# Patient Record
Sex: Female | Born: 1960 | Race: White | Hispanic: No | Marital: Married | Smoking: Current every day smoker
Health system: Southern US, Community
[De-identification: ages and names within clinical notes are randomized; demographics above are authoritative.]

## PROBLEM LIST (undated history)

## (undated) ENCOUNTER — Emergency Department (HOSPITAL_COMMUNITY): Payer: Self-pay

## (undated) DIAGNOSIS — Z87442 Personal history of urinary calculi: Secondary | ICD-10-CM

## (undated) DIAGNOSIS — F419 Anxiety disorder, unspecified: Secondary | ICD-10-CM

## (undated) DIAGNOSIS — E559 Vitamin D deficiency, unspecified: Secondary | ICD-10-CM

## (undated) DIAGNOSIS — I10 Essential (primary) hypertension: Secondary | ICD-10-CM

## (undated) DIAGNOSIS — N200 Calculus of kidney: Secondary | ICD-10-CM

## (undated) DIAGNOSIS — E669 Obesity, unspecified: Secondary | ICD-10-CM

## (undated) DIAGNOSIS — F329 Major depressive disorder, single episode, unspecified: Secondary | ICD-10-CM

## (undated) DIAGNOSIS — S21009A Unspecified open wound of unspecified breast, initial encounter: Secondary | ICD-10-CM

## (undated) DIAGNOSIS — E213 Hyperparathyroidism, unspecified: Secondary | ICD-10-CM

## (undated) DIAGNOSIS — F32A Depression, unspecified: Secondary | ICD-10-CM

## (undated) DIAGNOSIS — K760 Fatty (change of) liver, not elsewhere classified: Secondary | ICD-10-CM

## (undated) DIAGNOSIS — E785 Hyperlipidemia, unspecified: Secondary | ICD-10-CM

## (undated) DIAGNOSIS — R7303 Prediabetes: Secondary | ICD-10-CM

## (undated) DIAGNOSIS — G4733 Obstructive sleep apnea (adult) (pediatric): Secondary | ICD-10-CM

## (undated) DIAGNOSIS — G4730 Sleep apnea, unspecified: Secondary | ICD-10-CM

## (undated) DIAGNOSIS — Z8719 Personal history of other diseases of the digestive system: Secondary | ICD-10-CM

## (undated) DIAGNOSIS — K219 Gastro-esophageal reflux disease without esophagitis: Secondary | ICD-10-CM

## (undated) HISTORY — DX: Vitamin D deficiency, unspecified: E55.9

## (undated) HISTORY — DX: Hyperparathyroidism, unspecified: E21.3

## (undated) HISTORY — DX: Essential (primary) hypertension: I10

## (undated) HISTORY — DX: Fatty (change of) liver, not elsewhere classified: K76.0

## (undated) HISTORY — PX: BACK SURGERY: SHX140

## (undated) HISTORY — DX: Sleep apnea, unspecified: G47.30

## (undated) HISTORY — PX: THYROID SURGERY: SHX805

## (undated) HISTORY — DX: Calculus of kidney: N20.0

## (undated) HISTORY — DX: Personal history of other diseases of the digestive system: Z87.19

## (undated) HISTORY — DX: Obstructive sleep apnea (adult) (pediatric): G47.33

## (undated) HISTORY — DX: Gastro-esophageal reflux disease without esophagitis: K21.9

## (undated) HISTORY — DX: Obesity, unspecified: E66.9

## (undated) HISTORY — PX: COLONOSCOPY: SHX174

## (undated) HISTORY — DX: Hyperlipidemia, unspecified: E78.5

## (undated) HISTORY — PX: PARTIAL HYSTERECTOMY: SHX80

---

## 1999-02-20 ENCOUNTER — Encounter: Payer: Self-pay | Admitting: Family Medicine

## 1999-02-20 ENCOUNTER — Ambulatory Visit (HOSPITAL_COMMUNITY): Admission: RE | Admit: 1999-02-20 | Discharge: 1999-02-20 | Payer: Self-pay | Admitting: Family Medicine

## 1999-12-30 ENCOUNTER — Encounter: Payer: Self-pay | Admitting: *Deleted

## 1999-12-30 ENCOUNTER — Encounter: Admission: RE | Admit: 1999-12-30 | Discharge: 1999-12-30 | Payer: Self-pay | Admitting: *Deleted

## 2000-02-24 ENCOUNTER — Encounter: Payer: Self-pay | Admitting: *Deleted

## 2000-02-24 ENCOUNTER — Encounter: Admission: RE | Admit: 2000-02-24 | Discharge: 2000-02-24 | Payer: Self-pay | Admitting: *Deleted

## 2000-03-06 ENCOUNTER — Observation Stay (HOSPITAL_COMMUNITY): Admission: RE | Admit: 2000-03-06 | Discharge: 2000-03-07 | Payer: Self-pay | Admitting: Urology

## 2000-03-06 ENCOUNTER — Encounter: Payer: Self-pay | Admitting: Emergency Medicine

## 2000-04-27 ENCOUNTER — Other Ambulatory Visit: Admission: RE | Admit: 2000-04-27 | Discharge: 2000-04-27 | Payer: Self-pay | Admitting: Gynecology

## 2000-12-17 ENCOUNTER — Encounter: Admission: RE | Admit: 2000-12-17 | Discharge: 2000-12-17 | Payer: Self-pay | Admitting: Obstetrics and Gynecology

## 2000-12-17 ENCOUNTER — Encounter: Payer: Self-pay | Admitting: Obstetrics and Gynecology

## 2001-01-06 ENCOUNTER — Other Ambulatory Visit: Admission: RE | Admit: 2001-01-06 | Discharge: 2001-01-06 | Payer: Self-pay | Admitting: *Deleted

## 2001-02-16 ENCOUNTER — Encounter: Payer: Self-pay | Admitting: Urology

## 2001-02-16 ENCOUNTER — Encounter: Admission: RE | Admit: 2001-02-16 | Discharge: 2001-02-16 | Payer: Self-pay | Admitting: Urology

## 2001-07-21 ENCOUNTER — Encounter: Payer: Self-pay | Admitting: Emergency Medicine

## 2001-07-21 ENCOUNTER — Encounter: Admission: RE | Admit: 2001-07-21 | Discharge: 2001-07-21 | Payer: Self-pay | Admitting: Emergency Medicine

## 2001-08-16 ENCOUNTER — Encounter: Admission: RE | Admit: 2001-08-16 | Discharge: 2001-08-16 | Payer: Self-pay | Admitting: Emergency Medicine

## 2001-08-16 ENCOUNTER — Encounter: Payer: Self-pay | Admitting: Emergency Medicine

## 2001-09-29 ENCOUNTER — Encounter: Payer: Self-pay | Admitting: Orthopaedic Surgery

## 2001-09-30 ENCOUNTER — Inpatient Hospital Stay (HOSPITAL_COMMUNITY): Admission: RE | Admit: 2001-09-30 | Discharge: 2001-10-01 | Payer: Self-pay | Admitting: Orthopaedic Surgery

## 2002-04-01 ENCOUNTER — Encounter: Payer: Self-pay | Admitting: Orthopaedic Surgery

## 2002-04-01 ENCOUNTER — Encounter: Admission: RE | Admit: 2002-04-01 | Discharge: 2002-04-01 | Payer: Self-pay | Admitting: Orthopaedic Surgery

## 2002-05-11 ENCOUNTER — Encounter: Payer: Self-pay | Admitting: Gynecology

## 2002-05-11 ENCOUNTER — Encounter: Admission: RE | Admit: 2002-05-11 | Discharge: 2002-05-11 | Payer: Self-pay | Admitting: Gynecology

## 2002-07-20 ENCOUNTER — Encounter: Admission: RE | Admit: 2002-07-20 | Discharge: 2002-07-20 | Payer: Self-pay | Admitting: Obstetrics and Gynecology

## 2002-07-20 ENCOUNTER — Other Ambulatory Visit: Admission: RE | Admit: 2002-07-20 | Discharge: 2002-07-20 | Payer: Self-pay | Admitting: Obstetrics and Gynecology

## 2002-07-20 ENCOUNTER — Encounter: Payer: Self-pay | Admitting: Obstetrics and Gynecology

## 2002-08-01 ENCOUNTER — Encounter: Admission: RE | Admit: 2002-08-01 | Discharge: 2002-08-01 | Payer: Self-pay | Admitting: Family Medicine

## 2002-08-01 ENCOUNTER — Encounter: Payer: Self-pay | Admitting: Family Medicine

## 2002-12-26 ENCOUNTER — Encounter: Admission: RE | Admit: 2002-12-26 | Discharge: 2002-12-26 | Payer: Self-pay | Admitting: Otolaryngology

## 2002-12-26 ENCOUNTER — Encounter: Payer: Self-pay | Admitting: Otolaryngology

## 2003-01-26 ENCOUNTER — Encounter: Payer: Self-pay | Admitting: Obstetrics and Gynecology

## 2003-01-26 ENCOUNTER — Ambulatory Visit (HOSPITAL_COMMUNITY): Admission: RE | Admit: 2003-01-26 | Discharge: 2003-01-26 | Payer: Self-pay | Admitting: Obstetrics and Gynecology

## 2003-05-29 ENCOUNTER — Encounter: Admission: RE | Admit: 2003-05-29 | Discharge: 2003-05-29 | Payer: Self-pay | Admitting: Internal Medicine

## 2003-09-26 ENCOUNTER — Emergency Department (HOSPITAL_COMMUNITY): Admission: EM | Admit: 2003-09-26 | Discharge: 2003-09-26 | Payer: Self-pay

## 2003-11-27 ENCOUNTER — Encounter: Admission: RE | Admit: 2003-11-27 | Discharge: 2003-11-27 | Payer: Self-pay | Admitting: *Deleted

## 2004-04-25 ENCOUNTER — Encounter: Admission: RE | Admit: 2004-04-25 | Discharge: 2004-04-25 | Payer: Self-pay | Admitting: Internal Medicine

## 2004-04-30 ENCOUNTER — Ambulatory Visit (HOSPITAL_COMMUNITY): Admission: RE | Admit: 2004-04-30 | Discharge: 2004-04-30 | Payer: Self-pay | Admitting: Urology

## 2004-07-11 ENCOUNTER — Encounter: Admission: RE | Admit: 2004-07-11 | Discharge: 2004-07-11 | Payer: Self-pay | Admitting: Urology

## 2004-11-22 ENCOUNTER — Emergency Department (HOSPITAL_COMMUNITY): Admission: EM | Admit: 2004-11-22 | Discharge: 2004-11-22 | Payer: Self-pay | Admitting: Family Medicine

## 2005-02-28 ENCOUNTER — Encounter: Admission: RE | Admit: 2005-02-28 | Discharge: 2005-02-28 | Payer: Self-pay | Admitting: Internal Medicine

## 2005-03-19 ENCOUNTER — Encounter: Admission: RE | Admit: 2005-03-19 | Discharge: 2005-03-19 | Payer: Self-pay | Admitting: Family Medicine

## 2005-08-26 ENCOUNTER — Encounter: Admission: RE | Admit: 2005-08-26 | Discharge: 2005-08-26 | Payer: Self-pay | Admitting: Internal Medicine

## 2006-01-23 ENCOUNTER — Encounter: Admission: RE | Admit: 2006-01-23 | Discharge: 2006-01-23 | Payer: Self-pay | Admitting: Specialist

## 2006-03-13 ENCOUNTER — Encounter: Admission: RE | Admit: 2006-03-13 | Discharge: 2006-03-13 | Payer: Self-pay | Admitting: Internal Medicine

## 2006-03-13 ENCOUNTER — Ambulatory Visit (HOSPITAL_COMMUNITY): Admission: AD | Admit: 2006-03-13 | Discharge: 2006-03-14 | Payer: Self-pay | Admitting: Urology

## 2006-03-23 ENCOUNTER — Ambulatory Visit (HOSPITAL_COMMUNITY): Admission: RE | Admit: 2006-03-23 | Discharge: 2006-03-23 | Payer: Self-pay

## 2006-04-29 ENCOUNTER — Encounter: Admission: RE | Admit: 2006-04-29 | Discharge: 2006-04-29 | Payer: Self-pay | Admitting: Urology

## 2006-05-26 ENCOUNTER — Encounter: Admission: RE | Admit: 2006-05-26 | Discharge: 2006-05-26 | Payer: Self-pay

## 2006-05-27 ENCOUNTER — Ambulatory Visit (HOSPITAL_BASED_OUTPATIENT_CLINIC_OR_DEPARTMENT_OTHER): Admission: RE | Admit: 2006-05-27 | Discharge: 2006-05-27 | Payer: Self-pay | Admitting: Urology

## 2006-05-29 ENCOUNTER — Encounter: Admission: RE | Admit: 2006-05-29 | Discharge: 2006-05-29 | Payer: Self-pay | Admitting: Internal Medicine

## 2006-06-04 ENCOUNTER — Ambulatory Visit (HOSPITAL_COMMUNITY): Admission: RE | Admit: 2006-06-04 | Discharge: 2006-06-04 | Payer: Self-pay | Admitting: Urology

## 2006-06-05 ENCOUNTER — Emergency Department (HOSPITAL_COMMUNITY): Admission: EM | Admit: 2006-06-05 | Discharge: 2006-06-06 | Payer: Self-pay | Admitting: Emergency Medicine

## 2006-07-03 ENCOUNTER — Ambulatory Visit (HOSPITAL_COMMUNITY): Admission: EM | Admit: 2006-07-03 | Discharge: 2006-07-04 | Payer: Self-pay | Admitting: Emergency Medicine

## 2006-07-13 ENCOUNTER — Ambulatory Visit (HOSPITAL_COMMUNITY): Admission: RE | Admit: 2006-07-13 | Discharge: 2006-07-13 | Payer: Self-pay | Admitting: Urology

## 2006-08-03 ENCOUNTER — Encounter: Admission: RE | Admit: 2006-08-03 | Discharge: 2006-08-03 | Payer: Self-pay | Admitting: Internal Medicine

## 2007-01-12 ENCOUNTER — Encounter: Admission: RE | Admit: 2007-01-12 | Discharge: 2007-01-12 | Payer: Self-pay | Admitting: Internal Medicine

## 2007-02-17 ENCOUNTER — Encounter: Admission: RE | Admit: 2007-02-17 | Discharge: 2007-02-17 | Payer: Self-pay | Admitting: Neurosurgery

## 2007-07-06 ENCOUNTER — Encounter: Admission: RE | Admit: 2007-07-06 | Discharge: 2007-07-06 | Payer: Self-pay | Admitting: Internal Medicine

## 2007-09-30 ENCOUNTER — Encounter: Admission: RE | Admit: 2007-09-30 | Discharge: 2007-09-30 | Payer: Self-pay | Admitting: Internal Medicine

## 2007-10-27 ENCOUNTER — Encounter: Admission: RE | Admit: 2007-10-27 | Discharge: 2007-10-27 | Payer: Self-pay | Admitting: Surgery

## 2007-10-28 ENCOUNTER — Encounter: Admission: RE | Admit: 2007-10-28 | Discharge: 2007-10-28 | Payer: Self-pay | Admitting: Surgery

## 2008-01-06 ENCOUNTER — Ambulatory Visit (HOSPITAL_COMMUNITY): Admission: RE | Admit: 2008-01-06 | Discharge: 2008-01-07 | Payer: Self-pay | Admitting: Surgery

## 2008-01-06 ENCOUNTER — Encounter (INDEPENDENT_AMBULATORY_CARE_PROVIDER_SITE_OTHER): Payer: Self-pay | Admitting: Surgery

## 2008-01-25 ENCOUNTER — Observation Stay (HOSPITAL_COMMUNITY): Admission: EM | Admit: 2008-01-25 | Discharge: 2008-01-26 | Payer: Self-pay | Admitting: Emergency Medicine

## 2008-02-22 ENCOUNTER — Ambulatory Visit (HOSPITAL_COMMUNITY): Admission: RE | Admit: 2008-02-22 | Discharge: 2008-02-23 | Payer: Self-pay | Admitting: Urology

## 2008-03-21 ENCOUNTER — Encounter: Admission: RE | Admit: 2008-03-21 | Discharge: 2008-03-21 | Payer: Self-pay

## 2008-07-25 ENCOUNTER — Encounter: Admission: RE | Admit: 2008-07-25 | Discharge: 2008-07-25 | Payer: Self-pay | Admitting: Otolaryngology

## 2009-01-31 ENCOUNTER — Encounter: Admission: RE | Admit: 2009-01-31 | Discharge: 2009-01-31 | Payer: Self-pay | Admitting: Internal Medicine

## 2009-09-03 ENCOUNTER — Encounter: Admission: RE | Admit: 2009-09-03 | Discharge: 2009-09-03 | Payer: Self-pay | Admitting: Neurosurgery

## 2009-09-25 ENCOUNTER — Inpatient Hospital Stay (HOSPITAL_COMMUNITY): Admission: RE | Admit: 2009-09-25 | Discharge: 2009-09-28 | Payer: Self-pay | Admitting: Neurosurgery

## 2010-03-06 ENCOUNTER — Encounter: Admission: RE | Admit: 2010-03-06 | Discharge: 2010-03-06 | Payer: Self-pay | Admitting: Obstetrics and Gynecology

## 2010-05-21 ENCOUNTER — Encounter
Admission: RE | Admit: 2010-05-21 | Discharge: 2010-05-21 | Payer: Self-pay | Source: Home / Self Care | Attending: Otolaryngology | Admitting: Otolaryngology

## 2010-06-09 ENCOUNTER — Encounter: Payer: Self-pay | Admitting: Internal Medicine

## 2010-06-09 ENCOUNTER — Encounter: Payer: Self-pay | Admitting: Obstetrics and Gynecology

## 2010-06-09 ENCOUNTER — Encounter: Payer: Self-pay | Admitting: Gynecology

## 2010-06-09 ENCOUNTER — Encounter: Payer: Self-pay | Admitting: Diagnostic Radiology

## 2010-08-06 LAB — URINALYSIS, ROUTINE W REFLEX MICROSCOPIC
Glucose, UA: NEGATIVE mg/dL
Ketones, ur: NEGATIVE mg/dL
pH: 8.5 — ABNORMAL HIGH (ref 5.0–8.0)

## 2010-08-06 LAB — COMPREHENSIVE METABOLIC PANEL
ALT: 17 U/L (ref 0–35)
AST: 21 U/L (ref 0–37)
Albumin: 4.2 g/dL (ref 3.5–5.2)
Alkaline Phosphatase: 80 U/L (ref 39–117)
Alkaline Phosphatase: 83 U/L (ref 39–117)
BUN: 13 mg/dL (ref 6–23)
Calcium: 9.5 mg/dL (ref 8.4–10.5)
Chloride: 106 mEq/L (ref 96–112)
Creatinine, Ser: 0.77 mg/dL (ref 0.4–1.2)
GFR calc Af Amer: >60 mL/min (ref >60)
Glucose, Bld: 97 mg/dL (ref 70–99)
Potassium: 3.8 mEq/L (ref 3.5–5.1)
Potassium: 4.7 mEq/L (ref 3.5–5.1)
Total Bilirubin: 2.3 mg/dL — ABNORMAL HIGH (ref 0.3–1.2)
Total Protein: 7 g/dL (ref 6.0–8.3)

## 2010-08-06 LAB — CBC
HCT: 47.1 % — ABNORMAL HIGH (ref 36.0–46.0)
Hemoglobin: 16.3 g/dL — ABNORMAL HIGH (ref 12.0–15.0)
MCHC: 34.6 g/dL (ref 30.0–36.0)
MCV: 92.3 fL (ref 78.0–100.0)
Platelets: 223 10*3/uL (ref 150–400)
RDW: 14.1 % (ref 11.5–15.5)

## 2010-08-06 LAB — DIFFERENTIAL
Basophils Relative: 1 % (ref 0–1)
Lymphocytes Relative: 21 % (ref 12–46)
Monocytes Relative: 5 % (ref 3–12)
Neutro Abs: 5.5 10*3/uL (ref 1.7–7.7)
Neutrophils Relative %: 72 % (ref 43–77)

## 2010-08-06 LAB — APTT: aPTT: 33 seconds (ref 24–37)

## 2010-08-06 LAB — SURGICAL PCR SCREEN: Staphylococcus aureus: POSITIVE — AB

## 2010-08-06 LAB — PROTIME-INR: INR: 0.95 (ref 0.00–1.49)

## 2010-08-06 LAB — ABO/RH: ABO/RH(D): O POS

## 2010-10-01 NOTE — Op Note (Signed)
[name], [name] NO.:  [id number]   MEDICAL RECORD NO.:  [id number]          PATIENT TYPE:  AMB   LOCATION:  DAY                          FACILITY:  [facility]   PHYSICIAN:  Velora Heckler, MD      DATE OF BIRTH:  [birth year]   DATE OF PROCEDURE:  01/06/2008  DATE OF DISCHARGE:                               OPERATIVE REPORT   PREOPERATIVE DIAGNOSIS:  Primary hyperparathyroidism.   POSTOPERATIVE DIAGNOSIS:  Primary hyperparathyroidism.   PROCEDURE:  Right inferior parathyroidectomy.   SURGEON:  Velora Heckler, M.D., FACS   ASSISTANT:  Amber L. Freida Busman, M.D.   ANESTHESIA:  General per Dr. Brayton Caves.   ESTIMATED BLOOD LOSS:  Minimal.   PREPARATION:  Betadine.   COMPLICATIONS:  None.   INDICATIONS:  The patient is a 50 year old white female from [place],  [place].  She has a history of hypercalcemia with elevated intact  PTH levels.  She has had multiple kidney stones.  Workup included  laboratory studies showing an elevated calcium of 11.4 and an intact PTH  level of 88.4, ionized calcium was elevated at 6.0.  Sestamibi scan was  unrevealing.  MRI scan showed a 10-mm mass in the right inferior neck  consistent with parathyroid adenoma.  The patient now comes to surgery  for excision.   BODY OF REPORT:  The procedure was done in OR #11 at the [facility].  The patient was brought to the operating room and  placed in a supine position on the operating room table.  Following  administration of general anesthesia, the patient is positioned and then  prepped and draped in the usual strict aseptic fashion.  After  ascertaining that an adequate level of anesthesia had been achieved, a  lower right neck incision was made with a #15 blade.  Dissection was  carried through subcutaneous tissues and platysma.  Hemostasis was  obtained with the electrocautery.  Skin flaps were elevated  circumferentially.  A Weitlaner retractor was placed  for exposure.  Strap muscles were incised in the midline and reflected to the right.  Inferior pole of the thyroid is identified.  Dissection in the  tracheoesophageal groove reveals a small rounded mass consistent with  the image on MRI scan.  This was gently dissected out.  The recurrent  laryngeal nerve was draped across the mass and is gently mobilized away  from it, taking care to prevent injury.  Vascular tributaries were  divided between small Ligaclips.  Mass was completely mobilized and  excised.  It is submitted to pathology where frozen section by Dr. Berneta Levins confirms parathyroid tissue consistent with parathyroid  adenoma.  Good hemostasis was noted in the neck.  Surgicel was placed in  the operative field.  Strap muscles were reapproximated in the midline  with interrupted 3-0 Vicryl sutures.  Platysma was closed with  interrupted 3-0 Vicryl sutures.  Skin was anesthetized with local  anesthetic.  Skin  edges were reapproximated with a running 4-0 Monocryl subcuticular  suture.  Wound is washed and dried and  Benzoin and Steri-Strips are  applied.  Sterile dressings are applied.  The patient is awakened from  anesthesia and brought to the recovery room in stable condition.  The  patient tolerated the procedure well.      Velora Heckler, MD  Electronically Signed     TMG/MEDQ  D:  01/06/2008  T:  01/06/2008  Job:  [id number]   cc:   Kari Baars, M.D.  Fax: [phone]   Jamison Neighbor, M.D.  Fax: [phone]

## 2010-10-01 NOTE — Op Note (Signed)
NAMEKERRILYNN, [name] NO.:  [id number]   MEDICAL RECORD NO.:  [id number]          PATIENT TYPE:  OIB   LOCATION:  1419                         FACILITY:  [facility]   PHYSICIAN:  Jamison Neighbor, M.D.  DATE OF BIRTH:  1961-02-13   DATE OF PROCEDURE:  02/22/2008  DATE OF DISCHARGE:                               OPERATIVE REPORT   PREOPERATIVE DIAGNOSIS:  Left upper pole staghorn nephrolithiasis.   POSTOPERATIVE DIAGNOSIS:  Left upper pole staghorn nephrolithiasis.   PROCEDURES:  1. Left percutaneous nephrostolithotomy.  2. Intraoperative fluoroscopy with interpretation.   SURGEON:  Dr. Marcelyn Bruins   ASSISTANT:  Dr. Delman Kitten   ANESTHESIA:  General.   INDICATIONS FOR PROCEDURE:  [name] is a 50 year old white female  with past medical history positive for hyperparathyroidism.  Her  endocrine condition has been surgically corrected and now she presents  for definitive management of her stone burden.  She has had a history of  nephrolithiasis, including recent obstructive left distal ureteral stone  that was managed few weeks ago.  She has had an indwelling stent.  She  had primary access established in her lower pole in the interventional  radiology suite today.  Preoperatively all risks, benefits,  consequences, and concerns were discussed and informed consent was  obtained.   PROCEDURE IN DETAIL:  The patient was brought to the operating room and  placed in the supine position.  She was correctly identified by  wristband and appropriate time-out was taken.  IV antibiotics were  administered.  General anesthesia was delivered.  Once general  anesthesia was established, a 16-French Foley catheter was placed under  sterile conditions into her bladder.  This was placed to straight drain.  She was then rolled into a prone position and great care was taken to  pad all appropriate pressure points to reduce the risk of peripheral  neuropathy or tissue  compression.  Her left flank revealed the  indwelling angiographic catheter that was sutured to the skin.  This  suture was cut.  Her left flank was then prepped and draped in normal  sterile fashion.  Interventional radiology assisted in the next aspect  of the procedure.   We advanced a flexible tip guidewire through the angiographic catheter  using fluoroscopic guidance and guided the guidewire into the level of  bladder.  The indwelling ureteral stent was noted to have been pushed  down where the proximal curl was straightened and was at the level of  the UPJ.  Once the guidewire was safely in the bladder, the angiographic  catheter was removed.  We then used a dilating sheath over this  guidewire to dilate the tract appropriately.  The inner sheath was  removed and the outer sheath was left intact.  Through this we passed a  stiff guidewire also to the level of the bladder neck.  With both  guidewires safely traversing the left-sided urinary tract, we removed  the dilating sheath.   We then advanced an X-Force 15-cm nephrostomy balloon dilating catheter  over the stiff guidewire, again using fluoroscopic guidance.  Once  appropriately positioned, we infiltrated the balloon and dilated the  tract to approximately 16 atmospheres, sufficient enough to dilate the  waist out of the balloon.  Once appropriate dilation was achieved, we  then advanced the rigid access sheath over the balloon dilator, again  using fluoroscopic guidance.  Once appropriately positioned, the balloon  was deflated and removed, leaving both guidewires as well as the access  sheath behind.  We then advanced our nephroscope into the left renal  pelvis.  Normal saline was used as our irrigant.  We soon identified our  large upper pole staghorn stone.  We then used the lithoclast device to  fragment our stone completely.  Once sufficient fragmentation had been  achieved, we removed the lithoclast and used our Tricep  graspers to  remove the remaining stone burden.   We then performed flexible pyelography with a flexible cystoscope.  The  upper pole was now clean.  There was no evidence of perforation.  All of  the remaining stone fragments were smaller than 2 mm.  The rest of the  left renal collecting system was also stone free.  We then angled our  access sheath down to the UPJ and through our nephroscope, we were able  to grasp the proximal end of the double-J ureteral stent and pulled it  back into the appropriate position, as verified by fluoroscopy.  We  removed our first guidewire without any movement of our ureteral stent.  We then advanced an 18-French Councill tip catheter through the access  sheath into the right renal collecting system and inflated the balloon  with half-strength contrast.  Fluoroscopy verified the appropriate  position of this.  We then removed our sheath.  There was no significant  bleeding associated with this.  We then removed our remaining stiff  guidewire and fluoroscopy verified continued good position of our  nephrostomy tube as well as our ureteral stent.  We then sutured our  nephrostomy tube/Councill tip catheter to the skin using silk suture and  this marked the end of our procedure.  She awakened from anesthesia and  was taken to the recovery room in stable condition.  There were no  complications.  Dr. Logan Bores was present and participated in all aspects of  the case.     ______________________________  Villa Herb, M.D.  Electronically Signed    DW/MEDQ  D:  02/22/2008  T:  02/23/2008  Job:  [id number]

## 2010-10-01 NOTE — Op Note (Signed)
NAMEZOI, DEVINE NO.:  [id number]   MEDICAL RECORD NO.:  [id number]          PATIENT TYPE:  OBV   LOCATION:  1614                         FACILITY:  [facility]   PHYSICIAN:  Valetta Fuller, M.D.  DATE OF BIRTH:  August 07, 1960   DATE OF PROCEDURE:  01/26/2008  DATE OF DISCHARGE:  01/26/2008                               OPERATIVE REPORT   PREOPERATIVE DIAGNOSIS:  Left ureteral stone.   POSTOPERATIVE DIAGNOSIS:  Left ureteral stone.   PROCEDURE.:  1. Cystourethroscopy.  2. Left retrograde pyelogram.  3. Intraoperative fluoroscopy with interpretation.  4. Left ureteroscopic stone manipulation with laser lithotripsy.  5. Placement of left 6 x 24 double-J ureteral stent.   ATTENDING PHYSICIAN:  Valetta Fuller, M.D.   RESIDENT PHYSICIAN:  Maudie Flakes, MD.   ANESTHESIA:  General.   INDICATIONS FOR PROCEDURE:  Ms. Patty is a 50 year old white female  with a past history of nephrolithiasis.  She was admitted with  intractable pain and found to have a left distal ureteral stone, along  with a left lower pole renal stone.  She was brought to the hospital for  pain control, was given IV antibiotics and was scheduled to go to the  operating room this morning for the above-stated procedure.  Preoperatively all risks, benefits, consequences and concerns were  discussed.  Informed consent was obtained.   PROCEDURE IN DETAIL:  The patient was brought to the operating room and  placed in the supine position.  She was correctly identified by  wristband and an appropriate time-out was taken.  General anesthesia was  delivered.  Once adequately anesthetized, she was placed in the dorsal  lithotomy position -- with great care taken to minimize the risk of  peripheral neuropathy or compartment syndrome.  Her perineum was prepped  and draped sterilely.   We began our procedure by performing rigid cystourethroscopy with a 22-  French rigid cystoscopic sheath, 12-degree lens,  and normal saline as  our irrigant.  Her urethra was normal in course and caliber.  Upon  entering the bladder clear urine was identified.  Both ureteral orifices  were noted be in their normal anatomic position and effluxing clear  urine.  No urothelial abnormalities or stones were seen.  Her left  ureteral orifice was cannulated with an acorn tipped catheter.  Gentle  left retrograde pyelogram demonstrated the distal left ureteral stone as  a filling defect.  The left renal pelvis filled out appropriately;  and  no ureteral strictures were noticed and no other stones were seen in the  ureter as well.  We then gently cannulated the left ureteral orifice  with a sensor tipped guidewire, and advanced this up to the level of  left renal pelvis using fluoroscopic guidance.   We then removed the cystoscope and then advanced the semi-rigid  ureteroscope into the bladder.  Her distal left ureter and ureteral  orifice were both widely patent.  We were able to easily advance the  ureteroscope into the ureter and soon encountered the stone.  This stone  appeared soft.  We used a  360 nanometer set at 5 volt setttings at 0.5  joules of 5 Hz.  We then systematically dusted the stone to  submillimeter fragments.  We removed the laser fiber and used a nitinol  basket to grab the smaller fragments, and placed them into the bladder.   Final inspection demonstrated no large stone fragments and no ureteral  injury.  We then advanced a 6 x 24 contoured double-J stent over the  guidewire, and successfully placed the proximal curl in the left renal  collecting system.  The distal curl was noted to be in the bladder.  The  stent was seen to be venting urine, indicating appropriate function.  We  then removed the ureteroscope, replaced the cystoscope into bladder, and  at the same time irrigated her bladder free from all stone fragments.  These stone fragments will be sent for stone analysis.   She awoke  from anesthesia and was taken to the recovery room in stable  condition.  There were no complications.  She tolerated procedure well.   Dr. Isabel Caprice was present and participated in all aspects of the case.     ______________________________  Maudie Flakes, M.D.      Valetta Fuller, M.D.  Electronically Signed    WD/MEDQ  D:  01/26/2008  T:  01/26/2008  Job:  [id number]

## 2010-10-04 NOTE — Op Note (Signed)
[facility]  Patient:    [name], [name] Visit Number: [id number] MRN: [id number]          Service Type: SUR Location: 4W [id number] [id number] Attending Physician:  Patricia Nettle Dictated by:   Patricia Nettle, M.D. Proc. Date: 09/28/01 Admit Date:  09/29/2001                             Operative Report  DATE OF BIRTH:  June 20, 1960  PREOPERATIVE DIAGNOSES: 1. 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 2. Degenerative disk disease and lumbago.  POSTOPERATIVE DIAGNOSES: 1. 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 2. Degenerative disk disease and lumbago.  OPERATION: Left L4-L5 lateral recess decompression and diskectomy.  SURGEON:  Patricia Nettle, M.D.  ASSISTANT:  _____ Violet Baldy.  ANESTHESIA:  General.  COMPLICATIONS:  None.  INDICATIONS AND FINDINGS:  The patient is a 50 year old female who I have been treating for back and left lower extremity pain.  Her exam is consistent with a left L5 radiculopathy.  MRI scan showed multilevel degenerative changes with a bulging disk at L4-L5, facet hypertrophy, and thickening of the ligament of flavum creating severe left L4-L5 lateral recess stenosis.  She has had a discogram which showed a concurrent pain response at L3-4 and L5-S1.  She has failed various pain medications, anti-inflammatories, physical therapy, injections, and after weighing the risks and benefits of microdiskectomy, left L4-L5 she has decided to proceed with surgery in hopes of improving her symptoms.  She understands that she has significant degenerative changes and will likely have some residual back pain after the procedure.  At surgery, the L5 nerve root was found to be compressed within the lateral recess.  There was a bulging disk in the posterior lateral position and this was removed.  The disk material was found to be degenerative.  The L5 nerve  root was decompressed from its takeoff of the common dural sac all the way out the L5-S1 foramen.  DESCRIPTION OF PROCEDURE:  The patient was identified in the holding area as [name] and taken to the operating room.  She underwent general endotracheal anesthesia without difficulty.  She was turned prone to the adequate posterior four positioning frame.  She was given prophylactic IV antibiotics.  All bony prominences were padded.  The face was protected at all times.  A 4 cm incision was made over L3, L4 and L5.  This was carried down to the deep fascia.  The deep fascia was incised, and the paraspinal muscles were stripped out subperiosteally to the L3 and L4-L5 facet joints.  Care was taken to protect the facet capsules.  We took an intraoperative x-ray and our markers were on the L3 spinous process and we worked down from here.  We exposed the L4 and L5 lamina.  The retractor was placed.  Using a bur, the inferior one-third of L4 was removed.  The superior one-third of L5 was removed as well as the medial one-third of the facet joint.  We then used Kerrison rongeurs and angled curets to remove the remaining lamina exposing the epidural space.  Bleeding was controlled with bipolar electrocautery and Gelfoam.  We identified the L5 nerve root.  This was being compressed within the lateral recess and the overhanging parafacet was removed.  We then identified the disk which  was bulging and soft.  We easily entered the disk space with a Cytogeneticist.  Disk material immediately began to extrude out, and this was removed with a pituitary rongeur.  All loose fragments were removed.  The disk space was copiously irrigated and we floated out two small fragments and these were then removed.  The L5 nerve root was then decompressed until its entry point into the L5-S1 foramen.  We confirmed patency of the L5 nerve root out the foramen with a blunt probe. Decompression was carried  lateral flush with the pedicle.  The wound was copiously irrigated.  Then 15 mcg of fentanyl was left in the epidural space. Gelfoam was placed over the nerve root.  The deep layer was closed with #1 running Vicryl suture.  Subcutaneous layer was closed with 2-0 interrupted Vicryl followed by a running 4-0 subcuticular Vicryl and then Benzoin and Steri-Strips.  Sterile dressing was applied.  The patient was extubated without difficulty and was able to move her upper and lower extremities when she was transferred to the recovery room in stable condition. Dictated by:   Patricia Nettle, M.D. Attending Physician:  Patricia Nettle. DD:  09/29/01 TD:  09/30/01 Job: [id number]

## 2010-10-04 NOTE — Op Note (Signed)
NAME:  [name], [name] NO.:  [id number]   MEDICAL RECORD NO.:  [id number]          PATIENT TYPE:  AMB   LOCATION:  NESC                         FACILITY:  [facility]   PHYSICIAN:  Jamison Neighbor, M.D.  DATE OF BIRTH:  Oct 05, 1960   DATE OF PROCEDURE:  05/27/2006  DATE OF DISCHARGE:                               OPERATIVE REPORT   PREOPERATIVE DIAGNOSIS:  Left ureteropelvic junction stone.   POSTOPERATIVE DIAGNOSIS:  Left ureteropelvic junction stone.   PROCEDURE:  Cystoscopy, left retrograde,left ureteroscopy, left double-J  catheter insertion.   SURGEON:  Jamison Neighbor, M.D.   ANESTHESIA:  General.   COMPLICATIONS:  None.   DRAINS:  6-French x 26 cm double-J catheter.   BRIEF HISTORY:  This 50 year old female has acute left-sided flank pain.  CT scan demonstrated stone measuring 9 x 11 in the proximal ureter.  The  patient had marked hydronephrosis.  The patient now to undergo  cystoscopy and stent placement along with possible ureteroscopy with in  situ laser lithotripsy if feasible.  The patient understands the risks  and benefits of the procedure and gave full informed consent.   PROCEDURE:  After successful induction of general anesthesia, the  patient was placed in the dorsal position, prepped with Betadine and  draped in the usual sterile fashion.  Careful bimanual examination  reveals a grade 2 cystocele.  She does not have significant vault  prolapse and only a very modest rectocele.  The urethra was palpably  normal.  The cystoscope was inserted, bladder was carefully inspected.  No tumors or stones could be seen.  The ureteral orifice was  unremarkable in its appearance.  The patient underwent a retrograde  study.  She had it done through a 6-French ureteral catheter.   Retrograde ureteral pyelography performed through a 6-French ureteral  catheter showed a normal ureter with a filling defect in the proximal  ureter consistent with a large  stone.  The stone actually appeared to go  back up into the kidney and flipped back into the pelvis.  Collecting  system itself was markedly dilated due to the stone but no other filling  defects could be seen and the remainder of the collecting system  appeared to be normal.   After completion of the retrograde study, a guidewire was passed up to  the kidney.  Ureteroscopy was then performed.  Ureter was visualized in  its entirety and found to be very large in size and should certainly be  able to pass a significant sized stone.  The stone was seen to have gone  back up into the kidney and that position was really not amendable to in  situ laser lithotripsy.  It was felt to be better to simply place a  stent to allow for stone passage and then to proceed with ESWL.   The guidewire was left in place.  The ureteroscope was withdrawn.  Ureter was once again visualized and felt to be acceptable.  The  cystoscope was back loaded over the wire.  A 6-French x 26 cm double-J  catheter was passed  up to the kidney where it coiled normally in the  collecting system.  The bladder was drained.  The patient tolerated  procedure well, was taken to the recovery room good condition.  She will  be sent home on Percocet to take as needed for pain, Pyridium Plus to  take as needed for burning or spasm associated with the stent and will  begin placed on Macrodantin in case there is any bacteria within the  stone.  She will be maintained on antibiotics until we get stent out.  I  am going to plan for ESWL ASAP.           ______________________________  Jamison Neighbor, M.D.  Electronically Signed     RJE/MEDQ  D:  05/27/2006  T:  05/27/2006  Job:  [id number]

## 2010-10-04 NOTE — Op Note (Signed)
[name], SMETHURST NO.:  [id number]   MEDICAL RECORD NO.:  [id number]          PATIENT TYPE:  AMB   LOCATION:  DAY                          FACILITY:  [facility]   PHYSICIAN:  Jamison Neighbor, M.D.  DATE OF BIRTH:  1960/11/10   DATE OF PROCEDURE:  03/13/2006  DATE OF DISCHARGE:                                 OPERATIVE REPORT   PREOPERATIVE DIAGNOSIS:  Left ureteral and renal calculi.   POSTOPERATIVE DIAGNOSIS:  Left ureteral and renal calculi.   PROCEDURE:  Cystoscopy, left retrograde, left ureteroscopy, basket  extraction of ureteral calculus, and placement of left double-J catheter.   SURGEON:  Jamison Neighbor, M.D.   ANESTHESIA:  General.   COMPLICATIONS:  None.   DRAIN:  A 6-French x 26 cm double-J catheter.   BRIEF HISTORY:  This 50 year old female presented to the office in acute  pain.  The patient had a CT scan which showed a 19 mm stone in the left  kidney, but that was nonobstructing.  There was an obstructing calculus in  the midureter, approximately 9 mm in size.  The patient is now to undergo  cystoscopy and basket extraction and/or laser lithotripsy to the distal  ureteral calculus and placement of a double-J catheter so that she can  eventually undergo ESWL.  She understands the risks and benefits of the  procedure and gave full informed consent.  The patient was told  preoperatively that because she had a high white count she would be kept  overnight for observation and antibiotic therapy, even though she really has  not been febrile.  She understands the risks and benefits of the procedure,  including the need for additional treatment on the left kidney stone which  will be either ESWL or percutaneous nephrolithotripsy.  Full informed  consent was obtained.   PROCEDURE:  After successful induction of general anesthesia, the patient  was placed in the dorsal lithotomy position, prepped with Betadine, and  draped in the usual sterile  fashion.  Cystoscopy was performed.  The bladder  was carefully inspected.  No tumor or stones could be seen.  The left  ureteral orifice was identified.  A 6-French ureteral catheter was inserted  into the left ureter.   Left retrograde pyelogram was performed by injecting contrast under direct  vision through the opening in the catheter.  The patient was found to have a  filling defect in the midportion of the ureter that actually seemed to be  higher than the S1 level that had been described on the CT scan.  Above that  area, there was definite hydronephrosis.  A calculus was clearly defined  within the kidney itself.   A guide wire was then passed up to the kidney, and the ureteral catheter was  removed.  The ureteroscope was then inserted and advanced until the stone  could be identified.  The stone was grasped with a nitinol and was then  withdrawn under direct vision.  The double-J catheter was then passed over  the guide wire and allowed to coil normally in the renal pelvis  as well as  within the bladder.  The bladder was drained.  The patient tolerated the  procedure well and was taken to the recovery room in good condition.  The  patient will be kept for 23-hour observation and antibiotic coverage due to  high white count of over 20,000.  If the patient is afebrile tomorrow, she  will be sent home and will eventually have ESWL.  She will be maintained on  the antibiotic therapy until that time.           ______________________________  Jamison Neighbor, M.D.  Electronically Signed     RJE/MEDQ  D:  03/13/2006  T:  03/15/2006  Job:  [id number]

## 2010-10-04 NOTE — Op Note (Signed)
[facility]  Patient:    [name], [name]                       MRN: [id number] Proc. Date: 03/07/00 Adm. Date:  [id number] Disc. Date: [id number] Attending:  Cathren Laine                           Operative Report  SERVICE:  Urology.  PREOPERATIVE DIAGNOSES:  Right ureteral calculus.  POSTOPERATIVE DIAGNOSES:  Right ureteral calculus.  PROCEDURE:  Cystoscopy, right retrograde, right ureteroscopy, right basket extraction, and right double J catheter insertion.  SURGEON:  Dr. Logan Bores.  ANESTHESIA:  General.  COMPLICATIONS:  None.  DRAINS:  6 French right 26 cm double J catheter.  BRIEF HISTORY:  This 50 year old female presented to the emergency room at [facility] on March 06, 2000 with pain that had started at approximately 8:30 in the morning. A CT scan showed that she had a stone in the proximal ureter with hydronephrosis. The stone was in a position where it was not accessible to ESWL and because of its size, it was felt that holmium laser lithotripsy might be required. The patient was transferred from [facility] to [facility] in order to make the holmium laser available for the procedure. She was started on morphine and Toradol. She is now to undergo ureteroscopy with basket extraction and/or laser lithotripsy as indicated. The patient understands the risks and benefits of the procedure and gave full and informed consent.  DESCRIPTION OF PROCEDURE:  After successful induction of general anesthesia, the patient was placed in a dorsal lithotomy position and prepped with Betadine and draped in the usual sterile fashion. Cystoscopy was performed. The bladder was carefully inspected and was free of any tumor or stones. Both ureteral orifices were normal in configuration and location. Retrograde study performed on the right hand side showed a stone right overlying the sacrum where it would not be accessible to ESWL. A guidewire was passed  up to the kidney and the distal ureter was then dilated with a balloon dilator. The ureteroscope was inserted, the stone was visualized. In spite of its large size, it was successfully extracted using a basket. The ureteroscope was then advanced all the way up to the kidney. There was a significant kink in the ureter secondary to the obstruction but the guidewire was negotiated beyond that point. No other stones or areas of obstruction were noted. A double J catheter was passed up to the kidney, allowed to coil normally within the pelvis. It also coiled normally in the bladder. The string was left intact so the patient could pull this out in 48-72 hours. The patient tolerated the procedure well and was taken to the recovery room in good condition. She will be given a prescription for Lorcet plus to take as needed for pain, Pyridium plus for any burning or spasm and Septra DS 1 daily. DD:  03/07/00 TD:  03/08/00 Job: [id number]

## 2010-10-04 NOTE — Op Note (Signed)
[name], BIONDO NO.:  [id number]   MEDICAL RECORD NO.:  [id number]          PATIENT TYPE:  EMS   LOCATION:  ED                           FACILITY:  [facility]   PHYSICIAN:  Mark C. Vernie Ammons, M.D.  DATE OF BIRTH:  1960-11-11   DATE OF PROCEDURE:  07/04/2006  DATE OF DISCHARGE:                               OPERATIVE REPORT   PREOPERATIVE DIAGNOSIS:  Bilateral ureteral calculi with obstruction.   POSTOPERATIVE DIAGNOSIS:  Bilateral ureteral calculi with obstruction.   PROCEDURE:  Cystoscopy, bilateral retrograde pyelograms with  interpretation, bilateral ureteroscopy with bilateral stone basket  extraction and bilateral stent placement.   SURGEON:  Mark C. Vernie Ammons, M.D.   ANESTHESIA:  General.   SPECIMENS:  Stone to patient.   BLOOD LOSS:  Minimal.   DRAINS:  4.8 French 24 cm double-J stent in the right ureter (with  string), 6 French 26 cm double-J stent in the left ureter (no string).   COMPLICATIONS:  None.   INDICATIONS:  The patient is a 50 year old white female who has seen Dr.  Logan Bores in the past for renal and ureteral calculi.  She reports having  had three or four ureteroscopies and basket extractions in the past and  recently in January she had a stent placed and then lithotripsy of the  left renal calculus.  She reported she did good fragmentation of the  stone and now presented to the emergency room with severe right flank  pain.  A CT scan revealed in addition to a 12-mm stone in her left renal  pelvis, she had one 5 mm stone in the distal right ureter and one 5 mm  stone in the distal left ureter with moderate hydronephrosis on the  right.  We discussed the bilateral nature of this and the need for  ureteroscopic extraction of the stone with stent placement and possible  consideration of lithotripsy of a left renal calculus.  The risks,  complications and alternatives were discussed.  The patient understands  and elected to proceed.   DESCRIPTION OF OPERATION:  After informed consent, the patient brought  to the major OR, placed on table and administered general anesthesia.  She was then moved to the dorsal lithotomy position.  Genitalia was  sterilely prepped and draped.  Initially a 21-French cystoscope with 12  degrees lens was inserted in the bladder.  The bladder was noted to be  free of any tumor, stones or inflammatory lesions.  A grade 2 to 3  cystocele was evident.   The right ureteral orifice was identified and a 5 [place] open-ended  ureteral catheter was placed in the orifice and a 0.038 inch floppy tip  guidewire was then passed up the right ureter under direct fluoroscopic  control into the area of the renal pelvis.  I then removed the open-  ended ureteral catheter and replaced that with a 4 cm length ureteral  dilating balloon.  I dilated the ureteral orifice and intramural ureter  and then removed the dilating balloon and left the guidewire in place.   The 6-French rigid ureteroscope was then  passed into the bladder and  alongside the guidewire.  The stone was easily identified and  visualized.  It was grasped with nitinol basket and gently extracted  without any difficulty from the right ureter.  I reinserted the  ureteroscope and performed retrograde pyelogram.   Full strength contrast was injected through the ureteroscope within the  right ureter.  I noted no filling defects, mass effect or other  abnormalities of the ureter or intrarenal collecting system.  Ureteroscope was then removed and the cystoscope back loaded over the  guidewire.  The 4.8 French 24 cm double-J stent was then passed over the  guidewire and the guidewire was removed with good curl being noted in  the area of the renal pelvis and bladder.   I then performed an identical procedure on the left-hand side first  passing the open-ended catheter, then the guidewire and then followed by  the dilating balloon and then performed  ureteroscopy.  On this side  ureteroscopy proceeded easily again.  The stone was easily visualized  and again grasped with nitinol basket and extracted without difficulty.   I then reinserted the ureteroscope, noted no ureteral injury and  injected full strength contrast and performed a left retrograde  pyelogram which again revealed no evidence of mass effect, tumors  filling defects or other abnormality.  The intrarenal collecting system  appeared normal as well.  Ureteroscope was then removed, the cystoscope  again back loaded over the guidewire and the 6-French 26 cm double-J  stent was then passed over the guidewire into the area of the renal  pelvis.  The guidewire was removed and good curl was noted in the area  of the pelvis and bladder.  The string was not left affixed to the left  stent due to the continued presence of the large stone in the left renal  pelvis.  The bladder was then drained and the cystoscope was removed and  the patient was taken to recovery in stable satisfactory condition.  She  tolerated procedure well with no intraoperative complications.   The patient already has been scheduled for a 24-hour urine to evaluate  for stone formation.  She was given the stones and will bring those the  case stone analysis needs to be performed in the office.  She was given  a prescription for Pyridium Plus, number 36, Cipro 500 mg b.i.d. #14 and  #28 4 mg Dilaudid pills.  She will follow-up in the office with Dr.  Logan Bores in order for him to determine if and when to proceed with  treatment of her left renal pelvic stone.      Mark C. Vernie Ammons, M.D.  Electronically Signed     MCO/MEDQ  D:  07/04/2006  T:  07/04/2006  Job:  [id number]

## 2010-10-04 NOTE — Op Note (Signed)
NAMECARITA, SOLLARS NO.:  [id number]   MEDICAL RECORD NO.:  [id number]          PATIENT TYPE:  AMB   LOCATION:  DAY                          FACILITY:  [facility]   PHYSICIAN:  Jamison Neighbor, M.D.  DATE OF BIRTH:  Apr 27, 1961   DATE OF PROCEDURE:  04/30/2004  DATE OF DISCHARGE:                                 OPERATIVE REPORT   SERVICE:  Urology.   PREOPERATIVE DIAGNOSES:  Left ureteral calculus.   POSTOPERATIVE DIAGNOSES:  Migration of left ureteral calculus to kidney.   PROCEDURE:  Cystoscopy, left ureteroscopy, left flexible ureteropyeloscopy,  left double J catheter insertion, left retrograde.   SURGEON:  Jamison Neighbor, M.D.   ANESTHESIA:  General.   COMPLICATIONS:  None.   DRAINS:  None.   BRIEF HISTORY:  This 50 year old female has a past history of stones but has  not had any in several years. The patient developed pain on the left hand  side. A CT scan showed a 4 mm stone in the proximal ureter. The patient was  told the stone might pass but because her pain was so severe she requested  that something be done.  A stone this small size would be very difficult to  treat with ESWL and in this location it certainly was thought the best  option was to try and allow this to pass; however, the patient could not  tolerate this.  For that reason, she is now to undergo ureteroscopy with an  attempt to remove the stone.  The patient understands that if the stone  cannot be removed the stent will be left in place and the stone will be  allowed to pass at a later date.  The patient understands the risks and  benefits of the procedure and gave full and informed consent.   DESCRIPTION OF PROCEDURE:  After successful induction of general anesthesia,  the patient was placed in the dorsal lithotomy position, prepped with  Betadine and draped in the usual sterile fashion.  Cystoscopy was performed.  The patient has a modest cystocele but otherwise cystoscopic  examination was  unremarkable. The left ureteral orifice was normal in configuration and  location. A retrograde study performed on that side showed a normal ureter  for most of the length of the ureter and then just below the UPJ there was  an area of dilation and it was thought that there might be a small stone in  there but at that small size it was very difficult to tell.  A guidewire was  passed up into the kidney. The ureteroscope was then advanced along side the  guidewire. This was advanced all the way up into the kidney.  In the area of  the dilated ureter, no stone could be seen and it was thought the stone  perhaps had gone back up into the kidney. The ureteroscope was used to  extract as much of the kidney as could be seen through the rigid scope but  the stone was not seen. A second guidewire was placed up through the  ureteroscope which was then  withdrawn and the entire ureter was inspected  and was free of strictures, tumors or any other stone material. The flexible  ureteroscope was then advanced over the wire, a ureteral axis sheath was  used to dilate the intramural tunnel but was not passed all the way up to  the kidney. The ureteroscope was easily advanced up into the kidney over  that guidewire, it was removed leaving the safety wire in place. The upper  and mid pole sections were easily visualized and stones were not seen  within. A tiny stone was seen in one the lower pole calices. It was not sure  if this was the stone that had been seen on CT sitting in the lower pole or  perhaps it was the stone that had migrated back. This was not in a position  that could be easily basketed because of the lower pole location.  For that  reason, it was felt that the best thing would be to leave this well enough  alone, give the stone a chance to pass or perhaps consider ESWL.  The  guidewire was left in place, ureteroscope was withdrawn.  The double J  catheter was then passed  over the guidewire using fluoroscopic and  cystoscopic control.  The patient tolerated the procedure well and was taken  to the recovery room in good condition.  She will be sent home with  Percocet, Pyridium plus, and Septra and will return to see me in followup in  1-2 weeks. At that point, will get a plain film, see if the stone is visible  and amendable to ESWL, otherwise, will continue removal of the stone to  allow the stones to pass through the newly dilated ureter.      RJE/MEDQ  D:  04/30/2004  T:  04/30/2004  Job:  [id number]

## 2010-10-04 NOTE — H&P (Signed)
[facility]  Patient:    [name], [name] Visit Number: [id number] MRN: [id number]          Service Type: OBV Location: 4W 0447 01 Attending Physician:  Patricia Nettle Dictated by:   Sammuel Cooper. Mahar, P.A. Admit Date:  09/29/2001                           History and Physical  DATE OF BIRTH:  1960-07-08  CHIEF COMPLAINT:  Back and left lower extremity pain.  HISTORY OF PRESENT ILLNESS:  The patient is a 50 year old female who complains of severe back pain with radiation to the left buttock, hip, thigh, and knee. There are no symptoms below the knee.  She also describes weakness about her foot on the left side.  Her symptoms began back in December 2002, when she fell directly on her buttock while standing on a bar stool working on a Christmas tree.  At that point she was having mostly back pain.  She did make some improvement until February when she bent over to pick up something and felt a sudden pop.  She has had severe back and left leg pain since that event.  At this point she has been completely miserable.  Treatments such as rest, anti-inflammatories have been unsuccessful.  It is significantly affecting her activities of daily living and quality of life.  The risks and benefits of surgery were discussed with the patient by Dr. Sharolyn Douglas as well as myself.  She indicated understanding and wished to proceed.  ALLERGIES:  Anaphylactic reaction to CORTISONE.  PENICILLIN causes a rash. CODEINE causes itching.  MEDICATIONS:  Vicodin p.r.n.  PAST MEDICAL HISTORY: 1. Migraines. 2. History of kidney stones.  PAST SURGICAL HISTORY: 1. Hysterectomy in 1984. 2. Cyst removal from ovary in 1997.  SOCIAL HISTORY:  The patient is a Scientist, physiological at [employer].  She denies tobacco use, denies alcohol use.  She is married and has two children, ages 15 and 27. Her husband as well as her 43 year old child will be available to help  her postoperatively.  FAMILY HISTORY:  Father alive at age 91, with esophageal cancer, history of MI, and coronary artery disease.  Mother alive at age 45, healthy other than hypertension.  REVIEW OF SYSTEMS:  The patient denies any fevers, chills, sweats, or bleeding tendencies.  CNS:  Denies any blurred vision, double vision, headaches, seizure, or paralysis.  CARDIOVASCULAR:  Denies any chest pain, angina, orthopnea, claudication, or palpitations.  PULMONARY:  Denies any shortness of breath, productive cough, or hemoptysis.  GASTROINTESTINAL:  Denies any nausea, vomiting, constipation, diarrhea, melena, or bloody stool. GENITOURINARY:  Denies any dysuria, hematuria, discharge, or frequency.  PHYSICAL EXAMINATION:  VITAL SIGNS:  Blood pressure 140/84, respirations 16 and unlabored, pulse 64 and regular.  GENERAL:  The patient is a 50 year old white female who is alert and oriented, in no acute distress.  Well-nourished, well-groomed.  Appears stated age.  She is very pleasant and cooperative to exam.  She does appear to be uncomfortable sitting and moving about the examination room.  She moves very carefully.  HEENT:  Head normocephalic, atraumatic.  Pupils equal, round, and reactive to light.  Extraocular movements intact.  Nares patent bilaterally.  Pharynx is clear, without any erythema or exudate.  NECK:  Soft and supple to palpation.  No lymphadenopathy, thyromegaly, or carotid bruits noted.  CHEST:  Clear to auscultation bilaterally.  No rales, rhonchi,  stridor, wheezes, or friction rubs.  BREASTS:  Not pertinent, not performed.  HEART:  Regular S1, S2.  Regular rate and rhythm.  No murmurs, gallops, or rubs noted.  ABDOMEN:  Soft to palpation.  Positive bowel sounds.  Nontender, nondistended. No organomegaly noted.  GENITOURINARY:  Not pertinent, not performed.  EXTREMITIES:  As per Dr. Patria Mane office note, EHL 2/5, tibialis anterius 4-/5. Straight leg raise on  the left produces back and buttock pain.  Straight leg raise on the right produces buttock pain.  Reflexes are 4+ at the knees bilaterally, 3+ at the ankles bilaterally, and 2+ in the upper extremities bilaterally.  On examination today sensation is intact.  Motor function is grossly intact.  Dorsalis pedis and posterior tibialis pulses are intact and equal bilaterally.  SKIN:  Intact.  Without any lesions or rashes.  LABORATORY DATA:  X-ray shows spondylitic changes with mild disk space narrowing at L4-5, severe disk space narrowing at L5-S1.  IMPRESSION: 1. Degenerative disk disease with left L5 radiculopathy. 2. History of migraines.  PLAN:  Admit to [facility] on Sep 29, 2001, for a left L3-4 lateral recess decompression to be done by Dr. Sharolyn Douglas.  PRIMARY CARE PHYSICIAN:  Dr. Lorenz Coaster. Dictated by:   Sammuel Cooper. Mahar, P.A. Attending Physician:  Patricia Nettle. DD:  09/28/01 TD:  09/29/01 Job: [id number]

## 2011-02-15 IMAGING — RF DG LUMBAR SPINE 2-3V
1 series · 2 of 2 positions shown · non-contrast
Comparison: Lumbar MRI 09/03/2009.

CLINICAL DATA: L4-L5 TLIF

LUMBAR SPINE - 2-3 VIEW

[Series 1: run · 2 of 2 slices shown]
[im 1/2]
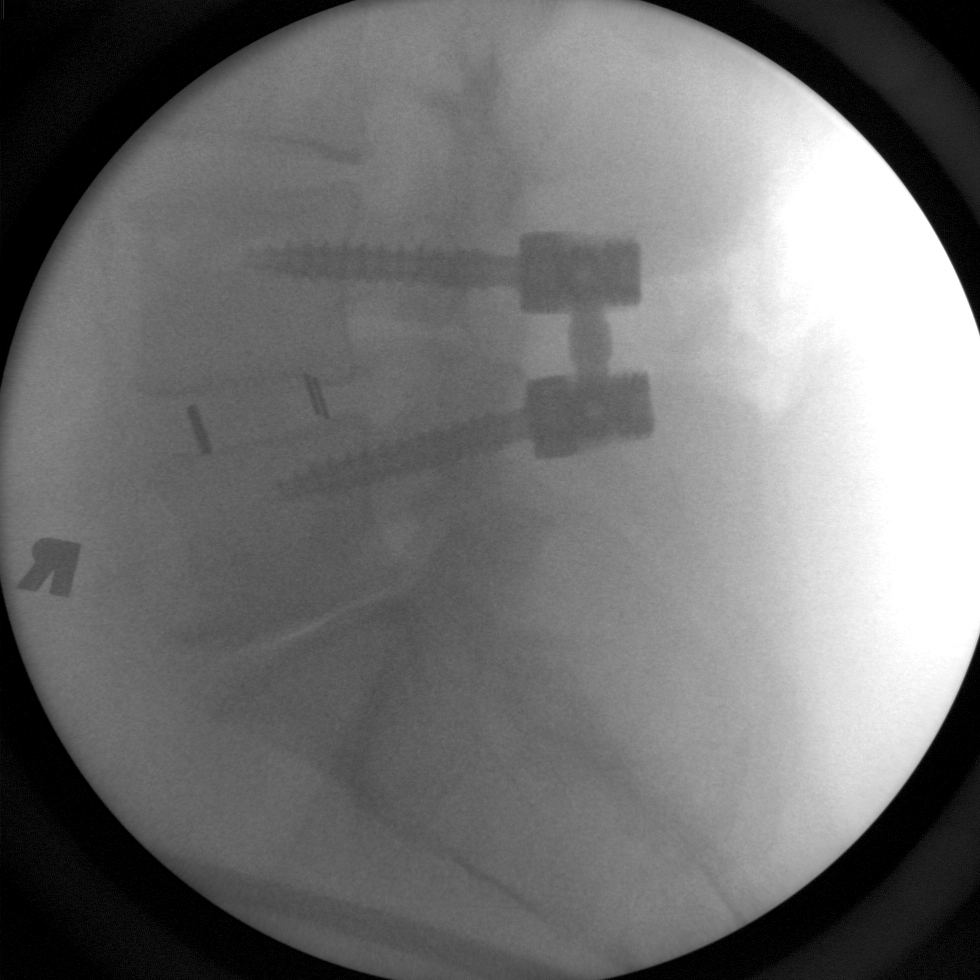
[im 2/2]
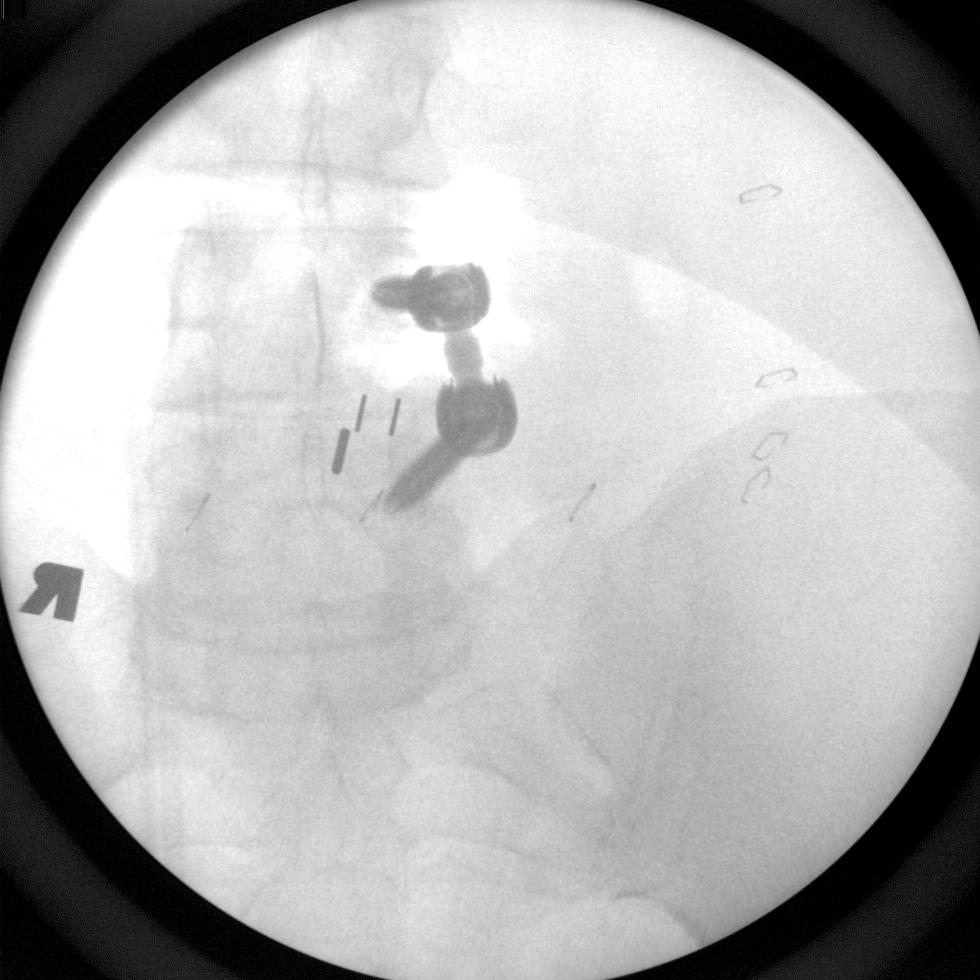

[2 of 2 positions shown; findings below may reference images not displayed]

FINDINGS: Lateral and PA spot fluoroscopic images of the lumbar
spine are submitted postoperatively from the operating room.  These
demonstrate posterior interbody and unilateral pedicle screw and
rod fusion (on the left as labeled) at L4-L5.  The hardware appears
well positioned.  No complications are identified.  There is disc
space loss at L5-S1.
IMPRESSION: Intraoperative views following L4-L5 fusion as described.

## 2011-02-17 LAB — BASIC METABOLIC PANEL
BUN: 17
CO2: 25
Calcium: 8.4
Calcium: 8.8
Creatinine, Ser: 0.76
Creatinine, Ser: 0.76
GFR calc Af Amer: >60
GFR calc non Af Amer: >60
Glucose, Bld: 155 — ABNORMAL HIGH
Glucose, Bld: 97

## 2011-02-17 LAB — CBC
HCT: 43.9
MCHC: 33.5
Platelets: 262
RDW: 13.1
RDW: 13.6

## 2011-02-17 LAB — DIFFERENTIAL
Eosinophils Absolute: 0
Monocytes Absolute: 0.8
Neutrophils Relative %: 92 — ABNORMAL HIGH

## 2011-02-17 LAB — HEMOGLOBIN AND HEMATOCRIT, BLOOD: HCT: 44.8

## 2011-02-17 LAB — GENTAMICIN LEVEL, RANDOM: Gentamicin Rm: 2.7

## 2011-02-19 LAB — DIFFERENTIAL
Eosinophils Relative: 0
Lymphocytes Relative: 7 — ABNORMAL LOW
Lymphs Abs: 1.3
Neutro Abs: 16.3 — ABNORMAL HIGH
Neutrophils Relative %: 89 — ABNORMAL HIGH

## 2011-02-19 LAB — BASIC METABOLIC PANEL
BUN: 22
Calcium: 8.8
GFR calc non Af Amer: >60
Potassium: 3.9

## 2011-02-19 LAB — URINE CULTURE

## 2011-02-19 LAB — URINALYSIS, ROUTINE W REFLEX MICROSCOPIC
Protein, ur: 30 — AB
Urobilinogen, UA: 1

## 2011-02-19 LAB — CBC
HCT: 46.2 — ABNORMAL HIGH
Platelets: 255
WBC: 18.3 — ABNORMAL HIGH

## 2011-02-19 LAB — URINE MICROSCOPIC-ADD ON

## 2013-01-26 ENCOUNTER — Other Ambulatory Visit: Payer: Self-pay

## 2013-01-26 DIAGNOSIS — Z1231 Encounter for screening mammogram for malignant neoplasm of breast: Secondary | ICD-10-CM

## 2013-01-31 ENCOUNTER — Encounter: Payer: Self-pay | Admitting: Internal Medicine

## 2013-02-21 ENCOUNTER — Ambulatory Visit: Payer: Self-pay

## 2013-02-28 ENCOUNTER — Ambulatory Visit
Admission: RE | Admit: 2013-02-28 | Discharge: 2013-02-28 | Disposition: A | Discharge status: Home / Self Care | Payer: PRIVATE HEALTH INSURANCE | Source: Ambulatory Visit

## 2013-02-28 DIAGNOSIS — Z1231 Encounter for screening mammogram for malignant neoplasm of breast: Secondary | ICD-10-CM

## 2013-03-09 ENCOUNTER — Encounter: Payer: Self-pay | Admitting: Neurology

## 2013-03-09 ENCOUNTER — Encounter (INDEPENDENT_AMBULATORY_CARE_PROVIDER_SITE_OTHER): Payer: Self-pay

## 2013-03-09 ENCOUNTER — Ambulatory Visit (INDEPENDENT_AMBULATORY_CARE_PROVIDER_SITE_OTHER): Payer: PRIVATE HEALTH INSURANCE | Admitting: Neurology

## 2013-03-09 VITALS — BP 125/83 | HR 69 | Temp 98.3°F | Ht 64.0 in | Wt 224.0 lb

## 2013-03-09 DIAGNOSIS — M545 Low back pain: Secondary | ICD-10-CM

## 2013-03-09 DIAGNOSIS — G4733 Obstructive sleep apnea (adult) (pediatric): Secondary | ICD-10-CM

## 2013-03-09 DIAGNOSIS — E669 Obesity, unspecified: Secondary | ICD-10-CM

## 2013-03-09 DIAGNOSIS — F17200 Nicotine dependence, unspecified, uncomplicated: Secondary | ICD-10-CM

## 2013-03-09 DIAGNOSIS — I10 Essential (primary) hypertension: Secondary | ICD-10-CM

## 2013-03-09 HISTORY — DX: Obstructive sleep apnea (adult) (pediatric): G47.33

## 2013-03-09 NOTE — Patient Instructions (Addendum)
Based on your symptoms and your exam and your recent home sleep test, I believe you are at risk for obstructive sleep apnea or OSA, and I think we should proceed with a sleep study to determine the right treatment pressure with CPAP. Please remember, the risks and ramifications of moderate to severe obstructive sleep apnea or OSA are: Cardiovascular disease, including congestive heart failure, stroke, difficult to control hypertension, arrhythmias, and even type 2 diabetes has been linked to untreated OSA. Sleep apnea causes disruption of sleep and sleep deprivation in most cases, which, in turn, can cause recurrent headaches, problems with memory, mood, concentration, focus, and vigilance. Most people with untreated sleep apnea report excessive daytime sleepiness, which can affect their ability to drive. Please do not drive if you feel sleepy.  I will see you back after your sleep study to go over the test results and where to go from there. We will call you after your sleep study and to set up an appointment at the time.

## 2013-03-09 NOTE — Progress Notes (Addendum)
Subjective:    Patient ID: [name] is a 52 y.o. female.  HPI  Huston Foley, MD, PhD [facility] [address], [address] [address] [place], [place] [address]  Dear Dr. Selena Batten,   I saw your patient, [name], upon your kind request in my neurologic clinic today for initial consultation of Her sleep disorder, in particular, concern for OSA. The patient is unaccompanied today. As you know, [name] is a very pleasant 52 y.o.-year-old Right female, with an underlying history of HTN and depression, who presents with a 2-3 year history of EDS, snoring and non-restorative sleep. She recently had a HST on 02/01/13, which I reviewed: the est. AHI was 20/h. Her lowest saturation was 79%. She has an underlying medical history of hypertension, obesity, and smokes. She's tried to cut back. She's trying to lose weight. She has gained weight in the last 6-12 months. Her typical bedtime is reported to be around 10:30 PM and usual wake time is around 6:15 AM. Sleep onset typically occurs within 15 minutes. She reports feeling marginally rested upon awakening. She wakes up on an average 2 times in the middle of the night and has to go to the bathroom 2 times on a typical night. She admits to occasional morning headaches.  She reports excessive daytime somnolence (EDS) and Her Epworth Sleepiness Score (ESS) is 14/24 today. She has not fallen asleep while driving. The patient has not been taking a planned nap, but falls asleep after work 3 times a week. 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  She has been known to snore for the past few years. Snoring is reportedly marked, and associated with choking sounds and witnessed apneas. The patient admits to a sense of choking or strangling feeling. There is no report of nighttime reflux, with no nighttime cough experienced. The patient has not noted any RLS symptoms and is not known to kick while asleep or before falling asleep.  There is a suspected family history of OSA in her mother.  She is not a very restless sleeper.   She denies cataplexy, sleep paralysis, hypnagogic or hypnopompic hallucinations, or sleep attacks. She does not report any vivid dreams, nightmares, dream enactments, or parasomnias, such as sleep talking or sleep walking. The patient has not had an attended sleep study.  She consumes 2 caffeinated beverages per day, usually in the form of coffee in AM.  Her bedroom is usually dark and cool. There is a TV in the bedroom and usually it is not on at night.   Her Past Medical History Is Significant For: Past Medical History  Diagnosis Date  . Hypertension     Her Past Surgical History Is Significant For: Past Surgical History  Procedure Laterality Date  . Partial hysterectomy    . Thyroid surgery    . Back surgery      Her Family History Is Significant For: Family History  Problem Relation Age of Onset  . COPD Mother   . Heart attack Father     Her Social History Is Significant For: History   Social History  . Marital Status: Married    Spouse Name: [name]    Number of Children: 3  . Years of Education: 12   Occupational History  . [employer]    Social History Main Topics  . Smoking status: Current Every Day Smoker    Types: Cigarettes  . Smokeless tobacco: None  . Alcohol Use: No  .  Drug Use: No  . Sexual Activity: No   Other Topics Concern  . None   Social History Narrative  . None    Her Allergies Are:  Allergies  Allergen Reactions  . Prednisone   :   Her Current Medications Are:  Outpatient Encounter Prescriptions as of 03/09/2013  Medication Sig Dispense Refill  . amLODipine (NORVASC) 5 MG tablet Take 5 mg by mouth daily.      Marland Kitchen venlafaxine (EFFEXOR) 37.5 MG tablet Take 37.5 mg by mouth daily.       No facility-administered encounter medications on file as of 03/09/2013.   Review of Systems:  Out of a complete 14 point review of systems, all  are reviewed and negative with the exception of these symptoms as listed below:  Review of Systems  Constitutional: Positive for fatigue.  Respiratory:       Snoring  Psychiatric/Behavioral:       Snoring    Objective:  Neurologic Exam  Physical Exam Physical Examination:   Filed Vitals:   03/09/13 0830  BP: 125/83  Pulse: 69  Temp: 98.3 F (36.8 C)    General Examination: The patient is a very pleasant 52 y.o. female in no acute distress. She appears well-developed and well-nourished and well groomed. She is overweight. She c/o of back pain on the R.  HEENT: Normocephalic, atraumatic, pupils are equal, round and reactive to light and accommodation. Funduscopic exam is normal with sharp disc margins noted. Extraocular tracking is good without limitation to gaze excursion or nystagmus noted. Normal smooth pursuit is noted. Hearing is grossly intact. Tympanic membranes are clear bilaterally. Face is symmetric with normal facial animation and normal facial sensation. Speech is clear with no dysarthria noted. There is no hypophonia. There is no lip, neck/head, jaw or voice tremor. Neck is supple with full range of passive and active motion. There are no carotid bruits on auscultation. Oropharynx exam reveals: mild mouth dryness, adequate dental hygiene and moderate airway crowding, due to narrow airway entry. Mallampati is class III. Tongue protrudes centrally and palate elevates symmetrically. Tonsils are 1+. She has a sensitive gag reflex. Neck size is 15 inches.   Chest: Clear to auscultation without wheezing, rhonchi or crackles noted.  Heart: S1+S2+0, regular and normal without murmurs, rubs or gallops noted.   Abdomen: Soft, non-tender and non-distended with normal bowel sounds appreciated on auscultation.  Extremities: There is no pitting edema in the distal lower extremities bilaterally. Pedal pulses are intact.  Skin: Warm and dry without trophic changes noted. There are no  varicose veins.  Musculoskeletal: exam reveals no obvious joint deformities, tenderness or joint swelling or erythema.   Neurologically:  Mental status: The patient is awake, alert and oriented in all 4 spheres. Her memory, attention, language and knowledge are appropriate. There is no aphasia, agnosia, apraxia or anomia. Speech is clear with normal prosody and enunciation. Thought process is linear. Mood is congruent and affect is normal.  Cranial nerves are as described above under HEENT exam. In addition, shoulder shrug is normal with equal shoulder height noted. Motor exam: Normal bulk, strength and tone is noted. There is no drift, tremor or rebound. Romberg is negative. Reflexes are 2+ throughout. Toes are downgoing bilaterally. Fine motor skills are intact with normal finger taps, normal hand movements, normal rapid alternating patting, normal foot taps and normal foot agility.  Cerebellar testing shows no dysmetria or intention tremor on finger to nose testing. Heel to shin is unremarkable bilaterally, with the  exception of back pain reported. There is no truncal or gait ataxia.  Sensory exam is intact to light touch, pinprick, vibration, temperature sense and proprioception in the upper and lower extremities.  Gait, station and balance are unremarkable, except for back pain when standing up. No veering to one side is noted. No leaning to one side is noted. Posture is age-appropriate and stance is narrow based. No problems turning are noted. She turns en bloc. Tandem walk is unremarkable.     Most of our visit time today was spent in counseling and coordination of care.         Assessment and Plan:   In summary, MILDERD MANOCCHIO is a very pleasant 52 y.o.-year old female with a history of HTH and a Hx and physical exam as well as a recent HST indicating obstructive sleep apnea (OSA), probably in the moderate degree. I had a long chat with the patient about my findings and the diagnosis of OSA,  its prognosis and treatment options. We talked about medical treatments and non-pharmacological approaches. I explained in particular the risks and ramifications of untreated moderate to severe OSA, especially with respect to developing cardiovascular disease down the Road, including congestive heart failure, difficult to treat hypertension, cardiac arrhythmias, or stroke. Even type 2 diabetes has in part been linked to untreated OSA. We talked about smoking cessation and trying to maintain a healthy lifestyle in general, as well as the importance of weight control. I encouraged the patient to eat healthy, exercise daily and keep well hydrated, to keep a scheduled bedtime and wake time routine, to not skip any meals and eat healthy snacks in between meals.  I recommended the following at this time: sleep study with potential positive airway pressure titration.  I explained the sleep test procedure to the patient and also outlined possible surgical and non-surgical treatment options of OSA, including the use of a custom-made dental device, upper airway surgical options, such as pillar implants, radiofrequency surgery, tongue base surgery, and UPPP. I also explained the CPAP treatment option to the patient, who indicated that she would be willing to try CPAP if the need arises. I explained the importance of being compliant with PAP treatment, not only for insurance purposes but primarily to improve Her symptoms, and for the patient's long term health benefit, including to reduce Her cardiovascular risks. I answered all her questions today and the patient was in agreement. I would like to see her back after the sleep study is completed and encouraged her to call with any interim questions, concerns, problems or updates.   Thank you very much for allowing me to participate in the care of this nice patient. If I can be of any further assistance to you please do not hesitate to call me at  [phone].  Sincerely,   Huston Foley, MD, PhD   Please note that I was able to review her baseline sleep study from 06/17/2012 [facility] and read by Dr. Randolm Idol: Sleep efficiency was reduced at 70.9% with a latency to sleep of 32 minutes and a REM latency of 306.5 minutes. There was an increased percentage of stage II sleep, a reduced percentage of slow-wave sleep and a reduced percentage of REM sleep. Her baseline oxygen saturation while asleep was not noted. Her baseline oxygen saturation while awake was 98% and her nadir was 77%. She spent 4.6% of the study below the saturation of 88% for the night. Her  overall AHI was 20 per hour. Her REM related AHI was 54.7 per hour. There were no significant periodic leg movements of sleep and no significant cardiac arrhythmias noted. She was advised to return for CPAP titration study.

## 2013-03-29 ENCOUNTER — Ambulatory Visit (AMBULATORY_SURGERY_CENTER): Payer: Self-pay | Admitting: *Deleted

## 2013-03-29 VITALS — Ht 64.0 in | Wt 222.0 lb

## 2013-03-29 DIAGNOSIS — Z1211 Encounter for screening for malignant neoplasm of colon: Secondary | ICD-10-CM

## 2013-03-29 MED ORDER — NA SULFATE-K SULFATE-MG SULF 17.5-3.13-1.6 GM/177ML PO SOLN: 1.0000 | Freq: Once | ORAL | Status: DC

## 2013-03-29 NOTE — Progress Notes (Signed)
No allergies to eggs or soy. No problems with anesthesia.  

## 2013-04-01 ENCOUNTER — Ambulatory Visit (INDEPENDENT_AMBULATORY_CARE_PROVIDER_SITE_OTHER): Payer: PRIVATE HEALTH INSURANCE

## 2013-04-01 DIAGNOSIS — E669 Obesity, unspecified: Secondary | ICD-10-CM

## 2013-04-01 DIAGNOSIS — M545 Low back pain: Secondary | ICD-10-CM

## 2013-04-01 DIAGNOSIS — F17200 Nicotine dependence, unspecified, uncomplicated: Secondary | ICD-10-CM

## 2013-04-01 DIAGNOSIS — G479 Sleep disorder, unspecified: Secondary | ICD-10-CM

## 2013-04-01 DIAGNOSIS — I10 Essential (primary) hypertension: Secondary | ICD-10-CM

## 2013-04-01 DIAGNOSIS — G4733 Obstructive sleep apnea (adult) (pediatric): Secondary | ICD-10-CM

## 2013-04-04 ENCOUNTER — Encounter: Payer: Self-pay | Admitting: Internal Medicine

## 2013-04-08 HISTORY — PX: OTHER SURGICAL HISTORY: SHX169

## 2013-04-12 ENCOUNTER — Ambulatory Visit (AMBULATORY_SURGERY_CENTER): Payer: PRIVATE HEALTH INSURANCE | Admitting: Internal Medicine

## 2013-04-12 ENCOUNTER — Encounter: Payer: Self-pay | Admitting: Internal Medicine

## 2013-04-12 VITALS — BP 122/72 | HR 58 | Temp 99.5°F | Resp 22 | Ht 64.0 in | Wt 222.0 lb

## 2013-04-12 DIAGNOSIS — Z1211 Encounter for screening for malignant neoplasm of colon: Secondary | ICD-10-CM

## 2013-04-12 MED ORDER — SODIUM CHLORIDE 0.9 % IV SOLN: 500.0000 mL | INTRAVENOUS | Status: DC

## 2013-04-12 NOTE — Op Note (Signed)
[facility] [address] [place], [address]   COLONOSCOPY PROCEDURE REPORT  PATIENT: [name], [name]  MR#: [id number] BIRTHDATE: 12-10-1960 , 52  yrs. old GENDER: Female ENDOSCOPIST: Iva Boop, MD, [facility] REFERRED NF:AOZHY Kim, M.D. PROCEDURE DATE:  04/12/2013 PROCEDURE:   Colonoscopy, screening First Screening Colonoscopy - Avg.  risk and is 50 yrs.  old or older Yes.  Prior Negative Screening - Now for repeat screening. N/A  History of Adenoma - Now for follow-up colonoscopy & has been > or = to 3 yrs.  N/A  Polyps Removed Today? No.  Recommend repeat exam, <10 yrs? No. ASA CLASS:   Class II INDICATIONS:average risk screening and first colonoscopy. MEDICATIONS: Propofol (Diprivan) 260 mg IV, MAC sedation, administered by CRNA, and These medications were titrated to patient response per physician's verbal order  DESCRIPTION OF PROCEDURE:   After the risks benefits and alternatives of the procedure were thoroughly explained, informed consent was obtained.  A digital rectal exam revealed no abnormalities of the rectum.   The LB [id number]  endoscope was introduced through the anus and advanced to the cecum, which was identified by both the appendix and ileocecal valve. No adverse events experienced.   The quality of the prep was excellent using Suprep  The instrument was then slowly withdrawn as the colon was fully examined.      COLON FINDINGS: A normal appearing cecum, ileocecal valve, and appendiceal orifice were identified.  The ascending, hepatic flexure, transverse, splenic flexure, descending, sigmoid colon and rectum appeared unremarkable.  No polyps or cancers were seen.   A right colon retroflexion was performed.  Retroflexed views revealed no abnormalities. The time to cecum=2 minutes 44 seconds. Withdrawal time=9 minutes 01 seconds.  The scope was withdrawn and the procedure completed. COMPLICATIONS: There were no  complications.  ENDOSCOPIC IMPRESSION: Normal colonoscopy - excellent prep - first screening  RECOMMENDATIONS: Repeat colonoscopy 10 years - 2024   eSigned:  Iva Boop, MD, [facility] 04/12/2013 10:48 AM   cc: The Patient and Pearson Grippe, MD

## 2013-04-12 NOTE — Progress Notes (Signed)
Report to pacu rn, vss, bbs=clear 

## 2013-04-12 NOTE — Progress Notes (Signed)
Patient did not experience any of the following events: a burn prior to discharge; a fall within the facility; wrong site/side/patient/procedure/implant event; or a hospital transfer or hospital admission upon discharge from the facility. (G8907) Patient did not have preoperative order for IV antibiotic SSI prophylaxis. (G8918)  

## 2013-04-12 NOTE — Patient Instructions (Addendum)
Your colonoscopy was normal.  Next routine colonoscopy in 10 years - 2024.  I appreciate the opportunity to care for you. Iva Boop, MD, [facility]       Discharge instructions given with verbal understanding. Normal exam. Resume previous medications. YOU HAD AN ENDOSCOPIC PROCEDURE TODAY AT THE [facility]: Refer to the procedure report that was given to you for any specific questions about what was found during the examination.  If the procedure report does not answer your questions, please call your gastroenterologist to clarify.  If you requested that your care partner not be given the details of your procedure findings, then the procedure report has been included in a sealed envelope for you to review at your convenience later.  YOU SHOULD EXPECT: Some feelings of bloating in the abdomen. Passage of more gas than usual.  Walking can help get rid of the air that was put into your GI tract during the procedure and reduce the bloating. If you had a lower endoscopy (such as a colonoscopy or flexible sigmoidoscopy) you may notice spotting of blood in your stool or on the toilet paper. If you underwent a bowel prep for your procedure, then you may not have a normal bowel movement for a few days.  DIET: Your first meal following the procedure should be a light meal and then it is ok to progress to your normal diet.  A half-sandwich or bowl of soup is an example of a good first meal.  Heavy or fried foods are harder to digest and may make you feel nauseous or bloated.  Likewise meals heavy in dairy and vegetables can cause extra gas to form and this can also increase the bloating.  Drink plenty of fluids but you should avoid alcoholic beverages for 24 hours.  ACTIVITY: Your care partner should take you home directly after the procedure.  You should plan to take it easy, moving slowly for the rest of the day.  You can resume normal activity the day after the procedure however you should  NOT DRIVE or use heavy machinery for 24 hours (because of the sedation medicines used during the test).    SYMPTOMS TO REPORT IMMEDIATELY: A gastroenterologist can be reached at any hour.  During normal business hours, 8:30 AM to 5:00 PM Monday through Friday, call [phone].  After hours and on weekends, please call the GI answering service at [phone] who will take a message and have the physician on call contact you.   Following lower endoscopy (colonoscopy or flexible sigmoidoscopy):  Excessive amounts of blood in the stool  Significant tenderness or worsening of abdominal pains  Swelling of the abdomen that is new, acute  Fever of 100F or higher  FOLLOW UP: If any biopsies were taken you will be contacted by phone or by letter within the next 1-3 weeks.  Call your gastroenterologist if you have not heard about the biopsies in 3 weeks.  Our staff will call the home number listed on your records the next business day following your procedure to check on you and address any questions or concerns that you may have at that time regarding the information given to you following your procedure. This is a courtesy call and so if there is no answer at the home number and we have not heard from you through the emergency physician on call, we will assume that you have returned to your regular daily activities without incident.  SIGNATURES/CONFIDENTIALITY: You and/or your care  partner have signed paperwork which will be entered into your electronic medical record.  These signatures attest to the fact that that the information above on your After Visit Summary has been reviewed and is understood.  Full responsibility of the confidentiality of this discharge information lies with you and/or your care-partner.

## 2013-04-13 ENCOUNTER — Telehealth: Payer: Self-pay | Admitting: Neurology

## 2013-04-13 ENCOUNTER — Telehealth: Payer: Self-pay | Admitting: *Deleted

## 2013-04-13 DIAGNOSIS — G4733 Obstructive sleep apnea (adult) (pediatric): Secondary | ICD-10-CM

## 2013-04-13 NOTE — Telephone Encounter (Signed)
Message left

## 2013-04-13 NOTE — Telephone Encounter (Signed)
Please call and inform patient that I have entered an order for treatment with PAP. She did well during the latest sleep study with CPAP. We will, therefore, arrange for a machine for home use through a DME (durable medical equipment) company of Her choice; and I will see the patient back in follow-up in about 6 weeks. Please also explain to the patient that I will be looking out for compliance data downloaded from the machine, which can be done remotely through a modem at times or stored on an SD card in the back of the machine. At the time of the followup appointment we will discuss sleep study results and how it is going with PAP treatment at home. Please advise patient to bring Her machine at the time of the visit; at least for the first visit, even though this is cumbersome. Bringing the machine for every visit after that may not be needed, but often helps for the first visit. Please also make sure, the patient has a follow-up appointment with me in about 6 weeks from the setup date, thanks.   Tanay Misuraca, MD, PhD [facility]  

## 2013-04-19 ENCOUNTER — Encounter: Payer: Self-pay | Admitting: *Deleted

## 2013-04-19 NOTE — Telephone Encounter (Signed)
I called and spoke with patient about her sleep study. I informed the patient that she did well on CPAP and that Dr. Frances Furbish recommends she start CPAP therapy at home. Also that I will mail her results and a letter with follow instructions after she receive her CPAP machine.

## 2013-04-20 ENCOUNTER — Encounter: Payer: Self-pay | Admitting: Neurology

## 2013-06-07 ENCOUNTER — Encounter: Payer: Self-pay | Admitting: Neurology

## 2013-06-08 NOTE — Progress Notes (Signed)
Quick Note:  I reviewed the patient's CPAP compliance data from 05/03/2013 to 06/01/2013, which is a total of 30 days, during which time the patient used CPAP every day except for 3 days. The average usage for all days was 4 hours and 13 minutes. The percent used days greater than 4 hours was 60 %, indicating fair compliance. The residual AHI was 1.6 per hour, indicating an adequate treatment pressure of 8 cwp with EPR of 1. I will review this data with the patient at the next office visit, which has been scheduled for 06/20/2013 at 3 PM, provide feedback and additional troubleshooting if need be. She will be advised to be more adherent to therapy and fully compliant with treatment, not only for insurance purposes but to also help her symptoms and reduce her cardiovascular risks.  Star Age, MD, PhD [facility]   ______

## 2013-06-09 ENCOUNTER — Encounter: Payer: Self-pay | Admitting: Neurology

## 2013-06-20 ENCOUNTER — Ambulatory Visit: Payer: PRIVATE HEALTH INSURANCE | Admitting: Neurology

## 2013-06-21 ENCOUNTER — Ambulatory Visit (INDEPENDENT_AMBULATORY_CARE_PROVIDER_SITE_OTHER): Payer: PRIVATE HEALTH INSURANCE | Admitting: Neurology

## 2013-06-21 ENCOUNTER — Encounter: Payer: Self-pay | Admitting: Neurology

## 2013-06-21 VITALS — BP 139/90 | HR 55 | Temp 97.9°F | Ht 64.0 in | Wt 224.0 lb

## 2013-06-21 DIAGNOSIS — F17200 Nicotine dependence, unspecified, uncomplicated: Secondary | ICD-10-CM

## 2013-06-21 DIAGNOSIS — G4733 Obstructive sleep apnea (adult) (pediatric): Secondary | ICD-10-CM

## 2013-06-21 DIAGNOSIS — I10 Essential (primary) hypertension: Secondary | ICD-10-CM

## 2013-06-21 DIAGNOSIS — E669 Obesity, unspecified: Secondary | ICD-10-CM

## 2013-06-21 NOTE — Patient Instructions (Addendum)
Please continue using your CPAP regularly. While your insurance requires that you use CPAP at least 4 hours each night on 70% of the nights, I recommend, that you not skip any nights and use it throughout the night if you can. Getting used to CPAP does take time and patience and discipline. Untreated obstructive sleep apnea when it is moderate to severe can have an adverse impact on cardiovascular health and raise her risk for heart disease, arrhythmias, hypertension, congestive heart failure, stroke and diabetes. Untreated obstructive sleep apnea causes sleep disruption, nonrestorative sleep, and sleep deprivation. This can have an impact on your day to day functioning and cause daytime sleepiness and impairment of cognitive function, memory loss, mood disturbance, and problems focussing. Using CPAP regularly can improve these symptoms.  Please work with your PCP on smoking cessation.

## 2013-06-21 NOTE — Progress Notes (Signed)
Subjective:    Patient ID: [name] is a 53 y.o. female.  HPI    Interim history:   [name] is a very pleasant 53 year old Right handed woman with an underlying history of HTN, obesity, smoking, and depression, who presents for followup consultation of her obstructive sleep apnea. The patient is unaccompanied today. I first met her on 03/09/2013, at which time she gave a 2-3 year history of EDS, snoring and non-restorative sleep. She had a HST on 02/01/13, which showed an est. AHI of 20/h. Her lowest oxygen saturation was 79%. At the time of her first visit I suggested a sleep study for full night CPAP titration. She has CPAP titration study on 04/01/2013, and I went over her test results with her in detail today. Her sleep efficiency was reduced at 82.8% with a latency to sleep of 34 minutes and wake after sleep onset of 33.5 minutes with mild sleep fragmentation noted. She had an increased percentage of stage II sleep, near absence of deep sleep, and an increased percentage of REM sleep at 32.3% with a normal REM latency. She had a baseline oxygen saturation of 92% with a nadir of 82%. Time below 88% saturation was 7 minutes and 29 seconds. She was titrated on CPAP from 5-8 cm of water pressure with a reduction of the AHI to 2.6 events per hour at the final pressure. Supine REM sleep was achieved. Based on the test results I started her on CPAP at home on a pressure of 8 cm. I reviewed the patient's CPAP compliance data from 05/03/2013 to 06/01/2013, 30 days, during which time the patient used CPAP every day except for 3 days. The average usage for all days was 4 hours and 13 minutes. The percent used days greater than 4 hours was 60%, indicating fair compliance. The residual AHI was 1.6 per hour, indicating an adequate treatment pressure of 8 cwp with EPR of 1.  Today, I reviewed the most recent compliance data off of her machine: 05/22/2013 through 06/20/2013, which is the last 30 days during  which time she uses CPAP 27 days. Percent used days greater than 4 hours was only 53%, indicating mediocre compliance. Her AHI is low at 1.5, leak is acceptable, average usage was 4 hours and 15 minutes. Today, she reports that she had some lapses in treatment d/t her mother in the hospital and a death in the family. She has chosen a FFM over the nasal, and is still adapting to it. Overall, she feels about the same. She has had some AM lightheadedness. She has noted an improvement in her daytime tiredness and has an improvement in her HAs and her daytime energy.   Her Past Medical History Is Significant For: Past Medical History  Diagnosis Date  . Hypertension   . OSA (obstructive sleep apnea) 03/09/2013    Her Past Surgical History Is Significant For: Past Surgical History  Procedure Laterality Date  . Partial hysterectomy    . Thyroid surgery    . Back surgery    . Sleep study  04/08/13    Her Family History Is Significant For: Family History  Problem Relation Age of Onset  . COPD Mother   . Heart attack Father   . Colon cancer Neg Hx     Her Social History Is Significant For: History   Social History  . Marital Status: Married    Spouse Name: joseph    Number of Children: 3  . Years of Education:  12   Occupational History  . [employer]    Social History Main Topics  . Smoking status: Current Every Day Smoker -- 0.50 packs/day    Types: Cigarettes  . Smokeless tobacco: Never Used  . Alcohol Use: No  . Drug Use: No  . Sexual Activity: No   Other Topics Concern  . None   Social History Narrative  . None    Her Allergies Are:  Allergies  Allergen Reactions  . Prednisone Swelling  :   Her Current Medications Are:  Outpatient Encounter Prescriptions as of 06/21/2013  Medication Sig  . amLODipine (NORVASC) 5 MG tablet Take 5 mg by mouth daily.  . Cholecalciferol (VITAMIN D-3) 5000 UNITS TABS Take by mouth daily.  . hydrochlorothiazide (MICROZIDE)  12.5 MG capsule Take 12.5 mg by mouth daily.  Marland Kitchen venlafaxine (EFFEXOR) 37.5 MG tablet Take 37.5 mg by mouth daily.  :  Review of Systems:  Out of a complete 14 point review of systems, all are reviewed and negative with the exception of these symptoms as listed below:   Review of Systems  Constitutional: Negative.   HENT: Negative.   Eyes: Negative.   Respiratory: Negative.   Cardiovascular: Negative.   Gastrointestinal: Negative.   Endocrine: Negative.   Genitourinary: Negative.   Musculoskeletal: Negative.   Skin: Negative.   Allergic/Immunologic: Negative.   Neurological: Negative.   Hematological: Negative.   Psychiatric/Behavioral: Negative.   All other systems reviewed and are negative.    Objective:  Neurologic Exam  Physical Exam Physical Examination:   Filed Vitals:   06/21/13 0907  BP: 139/90  Pulse: 55  Temp: 97.9 F (36.6 C)    General Examination: The patient is a very pleasant 53 y.o. female in no acute distress. She appears well-developed and well-nourished and well groomed. She is overweight.   HEENT: Normocephalic, atraumatic, pupils are equal, round and reactive to light and accommodation. Funduscopic exam is normal with sharp disc margins noted. Extraocular tracking is good without limitation to gaze excursion or nystagmus noted. Normal smooth pursuit is noted. Hearing is grossly intact. Face is symmetric with normal facial animation and normal facial sensation. Speech is clear with no dysarthria noted. There is no hypophonia. There is no lip, neck/head, jaw or voice tremor. Neck is supple with full range of passive and active motion. There are no carotid bruits on auscultation. Oropharynx exam reveals: mild mouth dryness, adequate dental hygiene and moderate airway crowding, due to narrow airway entry. Mallampati is class III. Tongue protrudes centrally and palate elevates symmetrically. Tonsils are 1+.    Chest: Clear to auscultation without wheezing,  rhonchi or crackles noted.  Heart: S1+S2+0, regular and normal without murmurs, rubs or gallops noted.   Abdomen: Soft, non-tender and non-distended with normal bowel sounds appreciated on auscultation.  Extremities: There is no pitting edema in the distal lower extremities bilaterally. Pedal pulses are intact.  Skin: Warm and dry without trophic changes noted. There are no varicose veins.  Musculoskeletal: exam reveals no obvious joint deformities, tenderness or joint swelling or erythema.   Neurologically:  Mental status: The patient is awake, alert and oriented in all 4 spheres. Her memory, attention, language and knowledge are appropriate. There is no aphasia, agnosia, apraxia or anomia. Speech is clear with normal prosody and enunciation. Thought process is linear. Mood is congruent and affect is normal.  Cranial nerves are as described above under HEENT exam. In addition, shoulder shrug is normal with equal shoulder height noted. Motor exam:  Normal bulk, strength and tone is noted. There is no drift, tremor or rebound. Romberg is negative. Reflexes are 2+ throughout. Toes are downgoing bilaterally. Fine motor skills are intact with normal finger taps, normal hand movements, normal rapid alternating patting, normal foot taps and normal foot agility.  Cerebellar testing shows no dysmetria or intention tremor on finger to nose testing. Heel to shin is unremarkable bilaterally, with the exception of back pain reported. There is no truncal or gait ataxia.  Sensory exam is intact to light touch, pinprick, vibration, temperature sense in the upper and lower extremities.  Gait, station and balance are unremarkable, except for back pain when standing up. No veering to one side is noted. No leaning to one side is noted. Posture is age-appropriate and stance is narrow based. No problems turning are noted. She turns en bloc. Tandem walk is unremarkable.     Assessment and Plan:   In summary, [name] is a very pleasant 53 y.o.-year old female with an underlying history of HTN, obesity, smoking, and depression, with a history of has a history of moderate obstructive sleep apnea, now on treatment with CPAP at 8 cwp. Her physical exam is stable and She indicates fairly good results with the use of CPAP in that she had improvement in her daytime tiredness and energy level as well as headaches. She has had some lapses and treatment because of her mother's health issues and her being in the hospital as well as a death in the family. I reiterated the importance of treatment in her case. We talked about smoking cessation and she is going to work on this with her primary care physician. She has her yearly checkup visit coming up. She has been able to tolerate the pressure and mask but is still adapting to using CPAP regularly. I talked to her at length about her CPAP titration study results as well as reviewed her compliance data with her and explained the findings. I encouraged her to continue to use CPAP regularly to help reduce cardiovascular risk. Her oxygen desaturations and lower levels baseline oxygen saturation may very well have to do with her smoking and will hopefully improve once she quits smoking.   We also talked about trying to maintaining a healthy lifestyle in general. I encouraged the patient to eat healthy, exercise daily and keep well hydrated, to keep a scheduled bedtime and wake time routine, to not skip any meals and eat healthy snacks in between meals and to have protein with every meal. I stressed the importance of regular exercise.   I answered all her questions today and the patient was in agreement with the above outlined plan. I would like to see the patient back in 3 months, sooner if the need arises and encouraged her to call with any interim questions, concerns, problems or updates.

## 2013-07-07 ENCOUNTER — Encounter: Payer: Self-pay | Admitting: Neurology

## 2013-07-11 ENCOUNTER — Other Ambulatory Visit: Payer: Self-pay | Admitting: Cardiology

## 2013-07-11 DIAGNOSIS — I739 Peripheral vascular disease, unspecified: Secondary | ICD-10-CM

## 2013-07-13 ENCOUNTER — Ambulatory Visit
Admission: RE | Admit: 2013-07-13 | Discharge: 2013-07-13 | Disposition: A | Discharge status: Home / Self Care | Payer: PRIVATE HEALTH INSURANCE | Source: Ambulatory Visit | Attending: Cardiology | Admitting: Cardiology

## 2013-07-13 DIAGNOSIS — I739 Peripheral vascular disease, unspecified: Secondary | ICD-10-CM

## 2013-07-15 NOTE — Progress Notes (Signed)
Quick Note:  I reviewed the patient's CPAP compliance data from 06/06/2013 to 07/05/2013, which is a total of 30 days, during which time the patient used CPAP every day except for 1 day. The average usage for all days was 4 hours and 46 minutes. The percent used days greater than 4 hours was 47 %, indicating suboptimal compliance. The residual AHI was 1.6 per hour, indicating an appropriate treatment pressure of 8 cwp with EPR of 2. I will review this data with the patient at the next office visit, provide feedback and additional troubleshooting if need be. In the interim, we will need to get in touch with the patient regarding his compliance. It looks like he uses it almost every night but not enough, and we need to find out why he is not using it enough hours each night. His DME company may also be able to troubleshoot. His next appointment with me scheduled for 11/10/2013 at 3:30 PM.  Star Age, MD, PhD [facility] ([place])   ______

## 2013-07-20 ENCOUNTER — Encounter: Payer: Self-pay | Admitting: Neurology

## 2013-11-10 ENCOUNTER — Ambulatory Visit: Payer: PRIVATE HEALTH INSURANCE | Admitting: Neurology

## 2013-11-10 ENCOUNTER — Telehealth: Payer: Self-pay | Admitting: *Deleted

## 2013-11-10 NOTE — Telephone Encounter (Signed)
Patient no showed to his appointment today.

## 2014-01-12 ENCOUNTER — Encounter (HOSPITAL_COMMUNITY): Payer: Self-pay | Admitting: Emergency Medicine

## 2014-01-12 ENCOUNTER — Emergency Department (HOSPITAL_COMMUNITY): Payer: PRIVATE HEALTH INSURANCE

## 2014-01-12 ENCOUNTER — Observation Stay (HOSPITAL_COMMUNITY)
Admission: EM | Admit: 2014-01-12 | Discharge: 2014-01-13 | Disposition: A | Discharge status: Home / Self Care | Payer: PRIVATE HEALTH INSURANCE | Attending: Internal Medicine | Admitting: Internal Medicine

## 2014-01-12 DIAGNOSIS — R9431 Abnormal electrocardiogram [ECG] [EKG]: Secondary | ICD-10-CM | POA: Diagnosis present

## 2014-01-12 DIAGNOSIS — Z880 Allergy status to penicillin: Secondary | ICD-10-CM | POA: Insufficient documentation

## 2014-01-12 DIAGNOSIS — F17200 Nicotine dependence, unspecified, uncomplicated: Secondary | ICD-10-CM | POA: Insufficient documentation

## 2014-01-12 DIAGNOSIS — R0609 Other forms of dyspnea: Secondary | ICD-10-CM | POA: Diagnosis not present

## 2014-01-12 DIAGNOSIS — R079 Chest pain, unspecified: Secondary | ICD-10-CM | POA: Insufficient documentation

## 2014-01-12 DIAGNOSIS — I10 Essential (primary) hypertension: Secondary | ICD-10-CM | POA: Insufficient documentation

## 2014-01-12 DIAGNOSIS — R0789 Other chest pain: Secondary | ICD-10-CM | POA: Diagnosis not present

## 2014-01-12 DIAGNOSIS — G4733 Obstructive sleep apnea (adult) (pediatric): Secondary | ICD-10-CM | POA: Insufficient documentation

## 2014-01-12 DIAGNOSIS — R110 Nausea: Secondary | ICD-10-CM | POA: Diagnosis not present

## 2014-01-12 DIAGNOSIS — Z79899 Other long term (current) drug therapy: Secondary | ICD-10-CM | POA: Diagnosis not present

## 2014-01-12 DIAGNOSIS — R0989 Other specified symptoms and signs involving the circulatory and respiratory systems: Secondary | ICD-10-CM | POA: Insufficient documentation

## 2014-01-12 LAB — CBC
HCT: 43.1 % (ref 36.0–46.0)
HEMOGLOBIN: 14.5 g/dL (ref 12.0–15.0)
MCH: 30.4 pg (ref 26.0–34.0)
MCHC: 33.6 g/dL (ref 30.0–36.0)
MCV: 90.4 fL (ref 78.0–100.0)
Platelets: 237 10*3/uL (ref 150–400)
RBC: 4.77 MIL/uL (ref 3.87–5.11)
RDW: 12.9 % (ref 11.5–15.5)
WBC: 11 10*3/uL — ABNORMAL HIGH (ref 4.0–10.5)

## 2014-01-12 LAB — BASIC METABOLIC PANEL
Anion gap: 16 — ABNORMAL HIGH (ref 5–15)
BUN: 32 mg/dL — ABNORMAL HIGH (ref 6–23)
CALCIUM: 9.2 mg/dL (ref 8.4–10.5)
CO2: 21 mEq/L (ref 19–32)
CREATININE: 1.05 mg/dL (ref 0.50–1.10)
Chloride: 104 mEq/L (ref 96–112)
GFR calc Af Amer: 70 mL/min — ABNORMAL LOW (ref >90)
GFR, EST NON AFRICAN AMERICAN: 60 mL/min — AB (ref >90)
GLUCOSE: 118 mg/dL — AB (ref 70–99)
Potassium: 3.8 mEq/L (ref 3.7–5.3)
SODIUM: 141 meq/L (ref 137–147)

## 2014-01-12 LAB — I-STAT TROPONIN, ED: TROPONIN I, POC: 0 ng/mL (ref 0.00–0.08)

## 2014-01-12 MED ORDER — NITROGLYCERIN 0.4 MG SL SUBL
0.4000 mg | SUBLINGUAL_TABLET | SUBLINGUAL | Status: DC | PRN
Start: 2014-01-12 — End: 2014-01-13
  Filled 2014-01-12: qty 1

## 2014-01-12 NOTE — ED Provider Notes (Signed)
CSN: [id number]     Arrival date & time 01/12/14  2226 History   First MD Initiated Contact with Patient 01/12/14 2257     Chief Complaint  Patient presents with  . Chest Pain     (Consider location/radiation/quality/duration/timing/severity/associated sxs/prior Treatment) Patient is a 53 y.o. female presenting with chest pain. The history is provided by the patient.  Chest Pain She had onset at about 8:30 PM of a severe pressure feeling in her central chest with radiation to the left shoulder. There is associated dyspnea and nausea but no diaphoresis. Symptoms started when she was having an argument with her daughter. She came in by ambulance and received aspirin nitroglycerin which reduced the discomfort dramatically. She rated her pressure at 10/10 when it came on and it is down to 1/10. She has not noticed anything else that made it better or worse. She does have significant cardiac risk factors of hypertension, tobacco abuse, and positive family history of premature coronary atherosclerosis. She has not had a stress test.  Past Medical History  Diagnosis Date  . Hypertension   . OSA (obstructive sleep apnea) 03/09/2013   Past Surgical History  Procedure Laterality Date  . Partial hysterectomy    . Thyroid surgery    . Back surgery    . Sleep study  04/08/13   Family History  Problem Relation Age of Onset  . COPD Mother   . Heart attack Father   . Colon cancer Neg Hx    History  Substance Use Topics  . Smoking status: Current Every Day Smoker -- 0.50 packs/day    Types: Cigarettes  . Smokeless tobacco: Never Used  . Alcohol Use: No   OB History   Grav Para Term Preterm Abortions TAB SAB Ect Mult Living                 Review of Systems  Cardiovascular: Positive for chest pain.  All other systems reviewed and are negative.     Allergies  Prednisone and Penicillins  Home Medications   Prior to Admission medications   Medication Sig Start Date End Date  Taking? Authorizing Provider  amLODipine (NORVASC) 5 MG tablet Take 5 mg by mouth daily.   Yes Historical Provider, MD  Cholecalciferol (VITAMIN D-3) 5000 UNITS TABS Take 1 tablet by mouth daily.    Yes Historical Provider, MD  hydrochlorothiazide (MICROZIDE) 12.5 MG capsule Take 12.5 mg by mouth daily.   Yes Historical Provider, MD  venlafaxine (EFFEXOR) 37.5 MG tablet Take 37.5 mg by mouth daily.   Yes Historical Provider, MD   BP 142/89  Pulse 74  Resp 16  SpO2 97% Physical Exam  Nursing note and vitals reviewed.  53 year old female, resting comfortably and in no acute distress. Vital signs are significant for mild hypertension. Oxygen saturation is 97%, which is normal. Head is normocephalic and atraumatic. PERRLA, EOMI. Oropharynx is clear. Neck is nontender and supple without adenopathy or JVD. Back is nontender and there is no CVA tenderness. Lungs are clear without rales, wheezes, or rhonchi. Chest is nontender. Heart has regular rate and rhythm without murmur. Abdomen is soft, flat, nontender without masses or hepatosplenomegaly and peristalsis is normoactive. Extremities have no cyanosis or edema, full range of motion is present. Skin is warm and dry without rash. Neurologic: Mental status is normal, cranial nerves are intact, there are no motor or sensory deficits.  ED Course  Procedures (including critical care time) Labs Review Results for orders placed during  the hospital encounter of 01/12/14  CBC      Result Value Ref Range   WBC 11.0 (*) 4.0 - 10.5 K/uL   RBC 4.77  3.87 - 5.11 MIL/uL   Hemoglobin 14.5  12.0 - 15.0 g/dL   HCT 43.1  36.0 - 46.0 %   MCV 90.4  78.0 - 100.0 fL   MCH 30.4  26.0 - 34.0 pg   MCHC 33.6  30.0 - 36.0 g/dL   RDW 12.9  11.5 - 15.5 %   Platelets 237  150 - 400 K/uL  BASIC METABOLIC PANEL      Result Value Ref Range   Sodium 141  137 - 147 mEq/L   Potassium 3.8  3.7 - 5.3 mEq/L   Chloride 104  96 - 112 mEq/L   CO2 21  19 - 32 mEq/L    Glucose, Bld 118 (*) 70 - 99 mg/dL   BUN 32 (*) 6 - 23 mg/dL   Creatinine, Ser 1.05  0.50 - 1.10 mg/dL   Calcium 9.2  8.4 - 10.5 mg/dL   GFR calc non Af Amer 60 (*) >90 mL/min   GFR calc Af Amer 70 (*) >90 mL/min   Anion gap 16 (*) 5 - 15  I-STAT TROPOININ, ED      Result Value Ref Range   Troponin i, poc 0.00  0.00 - 0.08 ng/mL   Comment 3            Imaging Review Dg Chest Port 1 View  01/12/2014   CLINICAL DATA:  Chest pain  EXAM: PORTABLE CHEST - 1 VIEW  COMPARISON:  09/18/2009  FINDINGS: The heart size and mediastinal contours are within normal limits. Both lungs are clear. The visualized skeletal structures are unremarkable.  IMPRESSION: No active disease.   Electronically Signed   By: Lucienne Capers M.D.   On: 01/12/2014 23:26     EKG Interpretation   Date/Time:  Thursday January 12 2014 22:33:42 EDT Ventricular Rate:  76 PR Interval:  149 QRS Duration: 83 QT Interval:  376 QTC Calculation: 423 R Axis:   -36 Text Interpretation:  Sinus rhythm Probable left atrial enlargement Left  axis deviation Low voltage, precordial leads When compared with ECG of  09/18/2009, No significant change was found Confirmed by [facility]  MD, Sofya Moustafa  ([id number]) on 01/12/2014 10:58:56 PM      MDM   Final diagnoses:  Chest pain, unspecified    Chest pain that is worrisome for cardiac etiology. She'll be given additional nitroglycerin to try to take her pain away completely. Old records reviewed and I see no evidence of prior cardiac evaluation. She does have a recent evaluation for obstructive sleep apnea.  Pain resolved before NTG could be given . Case is discussed with Dr. Ernestina Patches of [facility] who agrees to admit her under observation status.  Delora Fuel, MD [id number]

## 2014-01-12 NOTE — ED Notes (Signed)
Pt to ED via [facility] with c/o mid chest pain radiating into left arm which started having having a argument with her family.  Pt took ASA 81mg  and EMS gave pt NTG SL x's 2.  Pt also st's she had shortness of breath and nausea

## 2014-01-13 ENCOUNTER — Observation Stay (HOSPITAL_COMMUNITY): Payer: PRIVATE HEALTH INSURANCE

## 2014-01-13 DIAGNOSIS — R079 Chest pain, unspecified: Secondary | ICD-10-CM | POA: Diagnosis present

## 2014-01-13 DIAGNOSIS — R9431 Abnormal electrocardiogram [ECG] [EKG]: Secondary | ICD-10-CM | POA: Diagnosis present

## 2014-01-13 DIAGNOSIS — G4733 Obstructive sleep apnea (adult) (pediatric): Secondary | ICD-10-CM

## 2014-01-13 LAB — CREATININE, SERUM
CREATININE: 0.75 mg/dL (ref 0.50–1.10)
GFR calc Af Amer: >90 mL/min (ref >90)
GFR calc non Af Amer: >90 mL/min (ref >90)

## 2014-01-13 LAB — CBC
HEMATOCRIT: 41.7 % (ref 36.0–46.0)
HEMOGLOBIN: 13.9 g/dL (ref 12.0–15.0)
MCH: 30.3 pg (ref 26.0–34.0)
MCHC: 33.3 g/dL (ref 30.0–36.0)
MCV: 91 fL (ref 78.0–100.0)
Platelets: 226 10*3/uL (ref 150–400)
RBC: 4.58 MIL/uL (ref 3.87–5.11)
RDW: 13.1 % (ref 11.5–15.5)
WBC: 7.5 10*3/uL (ref 4.0–10.5)

## 2014-01-13 LAB — COMPREHENSIVE METABOLIC PANEL
ALT: 22 U/L (ref 0–35)
ANION GAP: 12 (ref 5–15)
AST: 16 U/L (ref 0–37)
Albumin: 3.5 g/dL (ref 3.5–5.2)
Alkaline Phosphatase: 86 U/L (ref 39–117)
BILIRUBIN TOTAL: 1.2 mg/dL (ref 0.3–1.2)
BUN: 28 mg/dL — AB (ref 6–23)
CHLORIDE: 105 meq/L (ref 96–112)
CO2: 24 meq/L (ref 19–32)
Calcium: 8.5 mg/dL (ref 8.4–10.5)
Creatinine, Ser: 0.76 mg/dL (ref 0.50–1.10)
GLUCOSE: 107 mg/dL — AB (ref 70–99)
POTASSIUM: 3.7 meq/L (ref 3.7–5.3)
SODIUM: 141 meq/L (ref 137–147)
Total Protein: 6.3 g/dL (ref 6.0–8.3)

## 2014-01-13 LAB — LIPID PANEL
CHOLESTEROL: 182 mg/dL (ref 0–200)
HDL: 35 mg/dL — ABNORMAL LOW (ref >39)
LDL Cholesterol: 125 mg/dL — ABNORMAL HIGH (ref 0–99)
TRIGLYCERIDES: 108 mg/dL (ref <150)
Total CHOL/HDL Ratio: 5.2 RATIO
VLDL: 22 mg/dL (ref 0–40)

## 2014-01-13 LAB — CBC WITH DIFFERENTIAL/PLATELET
Basophils Absolute: 0 10*3/uL (ref 0.0–0.1)
Basophils Relative: 0 % (ref 0–1)
Eosinophils Absolute: 0.1 10*3/uL (ref 0.0–0.7)
Eosinophils Relative: 1 % (ref 0–5)
HEMATOCRIT: 41.4 % (ref 36.0–46.0)
Hemoglobin: 13.9 g/dL (ref 12.0–15.0)
LYMPHS PCT: 34 % (ref 12–46)
Lymphs Abs: 2.6 10*3/uL (ref 0.7–4.0)
MCH: 30.5 pg (ref 26.0–34.0)
MCHC: 33.6 g/dL (ref 30.0–36.0)
MCV: 91 fL (ref 78.0–100.0)
MONO ABS: 0.5 10*3/uL (ref 0.1–1.0)
MONOS PCT: 6 % (ref 3–12)
NEUTROS ABS: 4.3 10*3/uL (ref 1.7–7.7)
Neutrophils Relative %: 59 % (ref 43–77)
Platelets: 217 10*3/uL (ref 150–400)
RBC: 4.55 MIL/uL (ref 3.87–5.11)
RDW: 13.1 % (ref 11.5–15.5)
WBC: 7.5 10*3/uL (ref 4.0–10.5)

## 2014-01-13 LAB — HEMOGLOBIN A1C
HEMOGLOBIN A1C: 5.9 % — AB (ref <5.7)
MEAN PLASMA GLUCOSE: 123 mg/dL — AB (ref <117)

## 2014-01-13 LAB — TROPONIN I: Troponin I: <0.3 ng/mL (ref <0.30)

## 2014-01-13 LAB — TSH: TSH: 6.4 u[IU]/mL — ABNORMAL HIGH (ref 0.350–4.500)

## 2014-01-13 MED ORDER — TECHNETIUM TC 99M SESTAMIBI GENERIC - CARDIOLITE
10.0000 | Freq: Once | INTRAVENOUS | Status: AC | PRN
  Administered 2014-01-13: 10 via INTRAVENOUS

## 2014-01-13 MED ORDER — HYDROCHLOROTHIAZIDE 12.5 MG PO CAPS
12.5000 mg | ORAL_CAPSULE | Freq: Every day | ORAL | Status: DC
  Administered 2014-01-13: 12.5 mg via ORAL
  Filled 2014-01-13: qty 1

## 2014-01-13 MED ORDER — REGADENOSON 0.4 MG/5ML IV SOLN
INTRAVENOUS | Status: AC
  Administered 2014-01-13: 0.4 mg via INTRAVENOUS
  Filled 2014-01-13: qty 5

## 2014-01-13 MED ORDER — VENLAFAXINE HCL 37.5 MG PO TABS
37.5000 mg | ORAL_TABLET | Freq: Every day | ORAL | Status: DC
  Filled 2014-01-13 (×3): qty 1

## 2014-01-13 MED ORDER — ASPIRIN EC 325 MG PO TBEC
325.0000 mg | DELAYED_RELEASE_TABLET | Freq: Every day | ORAL | Status: DC
  Administered 2014-01-13: 325 mg via ORAL
  Filled 2014-01-13: qty 1

## 2014-01-13 MED ORDER — AMLODIPINE BESYLATE 5 MG PO TABS
5.0000 mg | ORAL_TABLET | Freq: Every day | ORAL | Status: DC
  Administered 2014-01-13: 5 mg via ORAL
  Filled 2014-01-13: qty 1

## 2014-01-13 MED ORDER — HEPARIN SODIUM (PORCINE) 5000 UNIT/ML IJ SOLN
5000.0000 [IU] | Freq: Three times a day (TID) | INTRAMUSCULAR | Status: DC
  Filled 2014-01-13 (×3): qty 1

## 2014-01-13 MED ORDER — NITROGLYCERIN 0.4 MG SL SUBL
SUBLINGUAL_TABLET | SUBLINGUAL | Status: AC
  Filled 2014-01-13: qty 1

## 2014-01-13 MED ORDER — NITROGLYCERIN 0.4 MG SL SUBL
0.4000 mg | SUBLINGUAL_TABLET | SUBLINGUAL | Status: DC | PRN
  Administered 2014-01-13: 0.4 mg via SUBLINGUAL

## 2014-01-13 MED ORDER — TECHNETIUM TC 99M SESTAMIBI GENERIC - CARDIOLITE
30.0000 | Freq: Once | INTRAVENOUS | Status: AC | PRN
Start: 2014-01-13 — End: 2014-01-13
  Administered 2014-01-13: 30 via INTRAVENOUS

## 2014-01-13 MED ORDER — SODIUM CHLORIDE 0.9 % IV SOLN
INTRAVENOUS | Status: DC
  Administered 2014-01-13: 03:00:00 via INTRAVENOUS

## 2014-01-13 MED ORDER — ACETAMINOPHEN 325 MG PO TABS
650.0000 mg | ORAL_TABLET | Freq: Four times a day (QID) | ORAL | Status: DC | PRN
  Administered 2014-01-13 (×2): 650 mg via ORAL
  Filled 2014-01-13 (×2): qty 2

## 2014-01-13 MED ORDER — ASPIRIN EC 81 MG PO TBEC: 81.0000 mg | DELAYED_RELEASE_TABLET | Freq: Every day | ORAL | Status: DC

## 2014-01-13 MED ORDER — SODIUM CHLORIDE 0.9 % IJ SOLN
3.0000 mL | Freq: Two times a day (BID) | INTRAMUSCULAR | Status: DC
  Administered 2014-01-13 (×2): 3 mL via INTRAVENOUS

## 2014-01-13 MED ORDER — REGADENOSON 0.4 MG/5ML IV SOLN
0.4000 mg | Freq: Once | INTRAVENOUS | Status: AC
  Administered 2014-01-13: 0.4 mg via INTRAVENOUS

## 2014-01-13 NOTE — Progress Notes (Signed)
Pt arrived for GXT Myoview. Emotionally upset, tearful because of some issues with her daughter. Pt had severe mid sternal chest pain at 1:30 of Bruce. No EKG changes. Treadmill stopped. Discussed with Dr Mare Ferrari- will change to [facility].  Kerin Ransom PA-C 01/13/2014 11:16 AM

## 2014-01-13 NOTE — ED Notes (Signed)
Attempted to call report

## 2014-01-13 NOTE — Progress Notes (Signed)
Pts assessment unchanged from this am. D/c instructions reviewed with patient and her husband. No further questions. D/c'd home via wheelchair to private vehcile

## 2014-01-13 NOTE — H&P (Signed)
Hospitalist Admission History and Physical  Patient name: [name] Medical record number: [id number] Date of birth: 12-31-1960 Age: 53 y.o. Gender: female  Primary Care Provider: Jani Gravel, MD  Chief Complaint: chest pain  History of Present Illness:This is a 53 y.o. year old female with significant past medical history of obesity, tobacco abuse, HTN  presenting with chest pain. Pt states that she got into an argument with her daughter last night when she developed 101/10 central chest pain with radiation down L arm. Pt states that CP persisted for > 30 mins. Pt called 911 who instructed her to take ASA. This had minimal effect on pain. On EMS arrival, pt was given SL NTG x 2 with relief of pain. Pt denies any prior hx/o CP in the past.  Presented to ER hemodynamically stable. Afebrile. Chest pain free. CBC and BMET WNL apart from WBC 11. Trop neg x1. EKG NSR. CXR WNL.   CV risk factors: HTN, obesity, smoking, + family history (father-MI @50 ) Assessment and Plan: [name] is a 53 y.o. year old female presenting with chest pain   Active Problems:   Chest pain, unspecified   Chest pain   1-Chest Pain  -typical sxs with multiple CV risk factors -no active CP currently  -cycle CEs -risk stratification labs -tele bed  -full dose ASA, prn NTG -Will likely need stress test vs.more invasive testing  -cards consult in am-sooner if CP recurs  2-HTN -cont home regimen   FEN/GI: heart healthy diet  Prophylaxis: sub q heparin  Disposition: pending further evaluation  Code Status:Full Code    Patient Active Problem List   Diagnosis Date Noted  . Chest pain, unspecified 01/13/2014  . Chest pain 01/13/2014  . OSA (obstructive sleep apnea) 03/09/2013   Past Medical History: Past Medical History  Diagnosis Date  . Hypertension   . OSA (obstructive sleep apnea) 03/09/2013    Past Surgical History: Past Surgical History  Procedure Laterality Date  . Partial  hysterectomy    . Thyroid surgery    . Back surgery    . Sleep study  04/08/13    Social History: History   Social History  . Marital Status: Married    Spouse Name: joseph    Number of Children: 3  . Years of Education: 12   Occupational History  . [employer]    Social History Main Topics  . Smoking status: Current Every Day Smoker -- 0.50 packs/day    Types: Cigarettes  . Smokeless tobacco: Never Used  . Alcohol Use: No  . Drug Use: No  . Sexual Activity: No   Other Topics Concern  . None   Social History Narrative  . None    Family History: Family History  Problem Relation Age of Onset  . COPD Mother   . Heart attack Father   . Colon cancer Neg Hx     Allergies: Allergies  Allergen Reactions  . Prednisone Swelling  . Penicillins Rash    Current Facility-Administered Medications  Medication Dose Route Frequency Provider Last Rate Last Dose  . 0.9 %  sodium chloride infusion   Intravenous Continuous Shanda Howells, MD      . amLODipine (NORVASC) tablet 5 mg  5 mg Oral Daily Shanda Howells, MD      . aspirin EC tablet 325 mg  325 mg Oral Daily Shanda Howells, MD      . heparin injection 5,000 Units  5,000 Units Subcutaneous 3 times per day  Shanda Howells, MD      . hydrochlorothiazide (MICROZIDE) capsule 12.5 mg  12.5 mg Oral Daily Shanda Howells, MD      . nitroGLYCERIN (NITROSTAT) SL tablet 0.4 mg  0.4 mg Sublingual Q5 min PRN Delora Fuel, MD      . sodium chloride 0.9 % injection 3 mL  3 mL Intravenous Q12H Shanda Howells, MD      . venlafaxine [facility]) tablet 37.5 mg  37.5 mg Oral Daily Shanda Howells, MD       Current Outpatient Prescriptions  Medication Sig Dispense Refill  . amLODipine (NORVASC) 5 MG tablet Take 5 mg by mouth daily.      . Cholecalciferol (VITAMIN D-3) 5000 UNITS TABS Take 1 tablet by mouth daily.       . hydrochlorothiazide (MICROZIDE) 12.5 MG capsule Take 12.5 mg by mouth daily.      Marland Kitchen venlafaxine (EFFEXOR) 37.5 MG tablet  Take 37.5 mg by mouth daily.       Review Of Systems: 12 point ROS negative except as noted above in HPI.  Physical Exam: Filed Vitals:   01/13/14 0115  BP: 121/75  Pulse: 62  Resp: 17    General: alert, cooperative and moderately obese HEENT: PERRLA and extra ocular movement intact Heart: S1, S2 normal, no murmur, rub or gallop, regular rate and rhythm Lungs: clear to auscultation, no wheezes or rales and unlabored breathing Abdomen: abdomen is soft without significant tenderness, masses, organomegaly or guarding Extremities: extremities normal, atraumatic, no cyanosis or edema Skin:no rashes, no ecchymoses Neurology: normal without focal findings  Labs and Imaging: Lab Results  Component Value Date/Time   NA 141 01/12/2014 10:50 PM   K 3.8 01/12/2014 10:50 PM   CL 104 01/12/2014 10:50 PM   CO2 21 01/12/2014 10:50 PM   BUN 32* 01/12/2014 10:50 PM   CREATININE 1.05 01/12/2014 10:50 PM   GLUCOSE 118* 01/12/2014 10:50 PM   Lab Results  Component Value Date   WBC 11.0* 01/12/2014   HGB 14.5 01/12/2014   HCT 43.1 01/12/2014   MCV 90.4 01/12/2014   PLT 237 01/12/2014    Dg Chest Port 1 View  01/12/2014   CLINICAL DATA:  Chest pain  EXAM: PORTABLE CHEST - 1 VIEW  COMPARISON:  09/18/2009  FINDINGS: The heart size and mediastinal contours are within normal limits. Both lungs are clear. The visualized skeletal structures are unremarkable.  IMPRESSION: No active disease.   Electronically Signed   By: Lucienne Capers M.D.   On: 01/12/2014 23:26           Shanda Howells MD  Pager: [phone]

## 2014-01-13 NOTE — Progress Notes (Signed)
Agree with above.  Will proceed with Lexiscan.

## 2014-01-13 NOTE — ED Notes (Signed)
MD at bedside. 

## 2014-01-13 NOTE — ED Notes (Signed)
Report given to Jessica, RN.
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
## 2014-01-13 NOTE — Progress Notes (Signed)
Pt sleeping and heart rate came back down to the 30s but when I woke her up, she was asymptomatic and heart rate back into the 50s. Dr. Coralyn Pear informed. Will cont to monitor. Waiting for results of stress test

## 2014-01-13 NOTE — Progress Notes (Signed)
Pt sleeping and heart rate went to 39 nonsustained. I woke her up and her rate came back up to the 50s. [name] and Dr. Coralyn Pear on floor and notified. Will continue to monitor
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
## 2014-01-13 NOTE — Progress Notes (Signed)
UR completed 

## 2014-03-21 ENCOUNTER — Emergency Department (HOSPITAL_COMMUNITY)
Admission: EM | Admit: 2014-03-21 | Discharge: 2014-03-22 | Disposition: A | Discharge status: Home / Self Care | Payer: PRIVATE HEALTH INSURANCE | Attending: Emergency Medicine | Admitting: Emergency Medicine

## 2014-03-21 ENCOUNTER — Emergency Department (HOSPITAL_COMMUNITY): Payer: PRIVATE HEALTH INSURANCE

## 2014-03-21 DIAGNOSIS — Z7982 Long term (current) use of aspirin: Secondary | ICD-10-CM | POA: Insufficient documentation

## 2014-03-21 DIAGNOSIS — R1011 Right upper quadrant pain: Secondary | ICD-10-CM | POA: Insufficient documentation

## 2014-03-21 DIAGNOSIS — Z79899 Other long term (current) drug therapy: Secondary | ICD-10-CM | POA: Insufficient documentation

## 2014-03-21 DIAGNOSIS — R300 Dysuria: Secondary | ICD-10-CM

## 2014-03-21 DIAGNOSIS — R109 Unspecified abdominal pain: Secondary | ICD-10-CM | POA: Diagnosis present

## 2014-03-21 DIAGNOSIS — Z880 Allergy status to penicillin: Secondary | ICD-10-CM | POA: Diagnosis not present

## 2014-03-21 DIAGNOSIS — R319 Hematuria, unspecified: Secondary | ICD-10-CM

## 2014-03-21 DIAGNOSIS — N200 Calculus of kidney: Secondary | ICD-10-CM

## 2014-03-21 DIAGNOSIS — R10A1 Flank pain, right side: Secondary | ICD-10-CM

## 2014-03-21 DIAGNOSIS — I10 Essential (primary) hypertension: Secondary | ICD-10-CM | POA: Insufficient documentation

## 2014-03-21 DIAGNOSIS — G4733 Obstructive sleep apnea (adult) (pediatric): Secondary | ICD-10-CM | POA: Insufficient documentation

## 2014-03-21 DIAGNOSIS — Z720 Tobacco use: Secondary | ICD-10-CM | POA: Diagnosis not present

## 2014-03-21 DIAGNOSIS — R110 Nausea: Secondary | ICD-10-CM

## 2014-03-21 LAB — COMPREHENSIVE METABOLIC PANEL
ALT: 25 U/L (ref 0–35)
ANION GAP: 17 — AB (ref 5–15)
AST: 21 U/L (ref 0–37)
Albumin: 4.2 g/dL (ref 3.5–5.2)
Alkaline Phosphatase: 102 U/L (ref 39–117)
BUN: 25 mg/dL — ABNORMAL HIGH (ref 6–23)
CO2: 22 meq/L (ref 19–32)
CREATININE: 0.83 mg/dL (ref 0.50–1.10)
Calcium: 9.4 mg/dL (ref 8.4–10.5)
Chloride: 105 mEq/L (ref 96–112)
GFR calc Af Amer: >90 mL/min (ref >90)
GFR, EST NON AFRICAN AMERICAN: 80 mL/min — AB (ref >90)
GLUCOSE: 106 mg/dL — AB (ref 70–99)
Potassium: 4.5 mEq/L (ref 3.7–5.3)
Sodium: 144 mEq/L (ref 137–147)
TOTAL PROTEIN: 7.4 g/dL (ref 6.0–8.3)
Total Bilirubin: 0.8 mg/dL (ref 0.3–1.2)

## 2014-03-21 LAB — CBC WITH DIFFERENTIAL/PLATELET
BASOS ABS: 0 10*3/uL (ref 0.0–0.1)
Basophils Relative: 0 % (ref 0–1)
Eosinophils Absolute: 0.1 10*3/uL (ref 0.0–0.7)
Eosinophils Relative: 1 % (ref 0–5)
HEMATOCRIT: 44.8 % (ref 36.0–46.0)
Hemoglobin: 15 g/dL (ref 12.0–15.0)
LYMPHS ABS: 2.1 10*3/uL (ref 0.7–4.0)
LYMPHS PCT: 17 % (ref 12–46)
MCH: 30 pg (ref 26.0–34.0)
MCHC: 33.5 g/dL (ref 30.0–36.0)
MCV: 89.6 fL (ref 78.0–100.0)
MONO ABS: 0.7 10*3/uL (ref 0.1–1.0)
Monocytes Relative: 6 % (ref 3–12)
Neutro Abs: 8.9 10*3/uL — ABNORMAL HIGH (ref 1.7–7.7)
Neutrophils Relative %: 76 % (ref 43–77)
Platelets: 272 10*3/uL (ref 150–400)
RBC: 5 MIL/uL (ref 3.87–5.11)
RDW: 12.8 % (ref 11.5–15.5)
WBC: 11.9 10*3/uL — AB (ref 4.0–10.5)

## 2014-03-21 LAB — LIPASE, BLOOD: LIPASE: 56 U/L (ref 11–59)

## 2014-03-21 MED ORDER — KETOROLAC TROMETHAMINE 30 MG/ML IJ SOLN
30.0000 mg | Freq: Once | INTRAMUSCULAR | Status: AC
  Administered 2014-03-21: 30 mg via INTRAVENOUS
  Filled 2014-03-21: qty 1

## 2014-03-21 MED ORDER — HYDROMORPHONE HCL 1 MG/ML IJ SOLN
1.0000 mg | Freq: Once | INTRAMUSCULAR | Status: AC
  Administered 2014-03-21: 1 mg via INTRAVENOUS
  Filled 2014-03-21: qty 1

## 2014-03-21 MED ORDER — ONDANSETRON HCL 4 MG/2ML IJ SOLN
4.0000 mg | Freq: Once | INTRAMUSCULAR | Status: AC
  Administered 2014-03-21: 4 mg via INTRAVENOUS
  Filled 2014-03-21: qty 2

## 2014-03-21 MED ORDER — SODIUM CHLORIDE 0.9 % IV BOLUS (SEPSIS)
1000.0000 mL | Freq: Once | INTRAVENOUS | Status: AC
  Administered 2014-03-21: 1000 mL via INTRAVENOUS

## 2014-03-21 NOTE — ED Notes (Signed)
Pt states that she has not been able to urinate since 4pm today. Hx of kidney stones. Severe flank pain.

## 2014-03-21 NOTE — ED Notes (Signed)
Pt given 284ml of gingerale as a PO challenge.

## 2014-03-21 NOTE — ED Provider Notes (Signed)
CSN: [id number]     Arrival date & time 03/21/14  1950 History   First MD Initiated Contact with Patient 03/21/14 2037     Chief Complaint  Patient presents with  . Urinary Retention  . Flank Pain     (Consider location/radiation/quality/duration/timing/severity/associated sxs/prior Treatment) HPI Comments: [name] is a 53 y.o. female with a PMHx of HTN, OSA, and nephrolithiasis, who presents to the ED with complaints of R flank pain x8hrs with associated urinary symptoms including dribbling, increased frequency, increased urgency, and burning/pressure with urination. R flank pain is 10/10 constant sharp pain radiating into R abdomen, worse with movement and "everything", and without any alleviating factors. Has not tried any medications prior to arrival. Pt reports this feels like her prior kidney stones. Endorses nausea without vomiting. Denies fevers, chills, CP, SOB, vomiting, diarrhea, constipation, hematuria, malodorous urine, vaginal discharge/bleeding, myalgias, arthralgias, back pain, weakness, or paresthesias. Last kidney stone was 81yrs ago, had to have surgery to remove them. Denies injury or trauma to this area. No sick contacts or recent travel.  Patient is a 53 y.o. female presenting with flank pain. The history is provided by the patient. No language interpreter was used.  Flank Pain This is a new problem. The current episode started today. The problem occurs constantly. The problem has been unchanged. Associated symptoms include abdominal pain (radiating from R flank), nausea and urinary symptoms. Pertinent negatives include no arthralgias, change in bowel habit, chest pain, chills, coughing, fever, headaches, myalgias, numbness, rash, sore throat, vertigo, visual change, vomiting or weakness. Exacerbated by: "everything", movement. She has tried nothing for the symptoms. The treatment provided no relief.    Past Medical History  Diagnosis Date  . Hypertension   . OSA  (obstructive sleep apnea) 03/09/2013   Past Surgical History  Procedure Laterality Date  . Partial hysterectomy    . Thyroid surgery    . Back surgery    . Sleep study  04/08/13   Family History  Problem Relation Age of Onset  . COPD Mother   . Heart attack Father   . Colon cancer Neg Hx    History  Substance Use Topics  . Smoking status: Current Every Day Smoker -- 0.50 packs/day    Types: Cigarettes  . Smokeless tobacco: Never Used  . Alcohol Use: No   OB History    No data available     Review of Systems  Constitutional: Negative for fever and chills.  HENT: Negative for sore throat.   Respiratory: Negative for cough.   Cardiovascular: Negative for chest pain.  Gastrointestinal: Positive for nausea and abdominal pain (radiating from R flank). Negative for vomiting, diarrhea, constipation, blood in stool, abdominal distention and change in bowel habit.  Genitourinary: Positive for dysuria, urgency, frequency, flank pain, decreased urine volume and difficulty urinating. Negative for hematuria, vaginal bleeding, vaginal discharge and vaginal pain.  Musculoskeletal: Negative for myalgias, back pain and arthralgias.  Skin: Negative for rash.  Neurological: Negative for vertigo, weakness, numbness and headaches.   10 Systems reviewed and are negative for acute change except as noted in the HPI.    Allergies  Prednisone and Penicillins  Home Medications   Prior to Admission medications   Medication Sig Start Date End Date Taking? Authorizing Provider  amLODipine (NORVASC) 5 MG tablet Take 5 mg by mouth daily.    Historical Provider, MD  aspirin EC 81 MG tablet Take 1 tablet (81 mg total) by mouth daily. 01/13/14   Kelvin Cellar,  MD  Cholecalciferol (VITAMIN D-3) 5000 UNITS TABS Take 1 tablet by mouth daily.     Historical Provider, MD  hydrochlorothiazide (MICROZIDE) 12.5 MG capsule Take 12.5 mg by mouth daily.    Historical Provider, MD  venlafaxine (EFFEXOR) 37.5 MG  tablet Take 37.5 mg by mouth daily.    Historical Provider, MD   BP 147/77 mmHg  Pulse 70  Temp(Src) 97.5 F (36.4 C) (Oral)  SpO2 98% Physical Exam  Constitutional: She is oriented to person, place, and time. Vital signs are normal. She appears well-developed and well-nourished.  Non-toxic appearance. She appears distressed.  Afebrile, nontoxic, appears uncomfortable  HENT:  Head: Normocephalic and atraumatic.  Mouth/Throat: Oropharynx is clear and moist and mucous membranes are normal.  Eyes: Conjunctivae and EOM are normal. Right eye exhibits no discharge. Left eye exhibits no discharge.  Neck: Normal range of motion. Neck supple.  Cardiovascular: Normal rate, regular rhythm, normal heart sounds and intact distal pulses.  Exam reveals no gallop and no friction rub.   No murmur heard. Pulmonary/Chest: Effort normal and breath sounds normal. No respiratory distress. She has no decreased breath sounds. She has no wheezes. She has no rhonchi. She has no rales.  Abdominal: Soft. Normal appearance and bowel sounds are normal. She exhibits no distension. There is tenderness in the right upper quadrant and right lower quadrant. There is no rigidity, no rebound, no guarding, no CVA tenderness, no tenderness at McBurney's point and negative Murphy's sign.  Soft, obese, nondistended, +BS throughout, with moderate R lateral abd TTP, no r/g/r, neg murphy's, neg mcburney's, no CVA TTP  Musculoskeletal: Normal range of motion.  Neurological: She is alert and oriented to person, place, and time. She has normal strength. No sensory deficit.  Skin: Skin is warm, dry and intact. No rash noted.  Psychiatric: She has a normal mood and affect.  Nursing note and vitals reviewed.   ED Course  Procedures (including critical care time) Labs Review Labs Reviewed  URINALYSIS, ROUTINE W REFLEX MICROSCOPIC - Abnormal; Notable for the following:    APPearance HAZY (*)    Hgb urine dipstick LARGE (*)     Leukocytes, UA TRACE (*)    All other components within normal limits  CBC WITH DIFFERENTIAL - Abnormal; Notable for the following:    WBC 11.9 (*)    Neutro Abs 8.9 (*)    All other components within normal limits  COMPREHENSIVE METABOLIC PANEL - Abnormal; Notable for the following:    Glucose, Bld 106 (*)    BUN 25 (*)    GFR calc non Af Amer 80 (*)    Anion gap 17 (*)    All other components within normal limits  URINE MICROSCOPIC-ADD ON - Abnormal; Notable for the following:    Crystals CA OXALATE CRYSTALS (*)    All other components within normal limits  URINE CULTURE  LIPASE, BLOOD    Imaging Review Ct Renal Stone Study  03/21/2014   CLINICAL DATA:  Right flank pain with dysuria.  EXAM: CT ABDOMEN AND PELVIS WITHOUT CONTRAST  TECHNIQUE: Multidetector CT imaging of the abdomen and pelvis was performed following the standard protocol without IV contrast.  COMPARISON:  CT 03/26/2008 and MRI 01/31/2009  FINDINGS: Lung bases are clear.  Negative for free air.  Moderate right hydroureteronephrosis due to astone in the distal right ureter just proximal to the ureterovesical junction. Stone roughly measures 3 mm. There may be an adjacent punctate stone in the distal right ureter. There are  small calcifications involving the anterior left kidney. No evidence for left hydronephrosis. Urinary bladder is decompressed.  The uterus has been removed. Normal appearance of the liver, gallbladder, spleen, pancreas and adrenal glands. No significant free fluid or lymphadenopathy. Atherosclerotic calcification in the aorta without aneurysm. Surgical clip near the cecum may be related to an appendectomy. Normal appearance of the small bowel.  Left-sided pedicle screw and rod fixation at L4-L5. Interbody device along the left side of L4-L5. Marked disc space narrowing at L5-S1. Again noted is a focal round lucency along the right side of the L5 vertebral body. No acute bone abnormality.  IMPRESSION: Moderate  right hydroureteronephrosis due to stones in the distal right ureter.  Question small stones versus cortical calcifications in the left kidney.   Electronically Signed   By: Markus Daft M.D.   On: 03/21/2014 22:28     EKG Interpretation None      MDM   Final diagnoses:  Right flank pain  Nephrolithiasis  Dysuria  Hematuria  Nausea    53y/o female with R flank pain sudden onset, no urinary changes. Likely kidney stone. CBC w/diff showing mild leukocytosis likely related to dehydration. BUN mildly elevated, again likely related to dehydration. Awaiting urine results as well as lipase. Bladder scan showing 55mL. Will order CT for stone. Will give fluids and toradol+dilaudid and zofran to help with symptoms. Will reassess shortly.  11:19 PM CT demonstrating 5mm stone, mild hydronephrosis. Pain mildly improved but now returning, will reorder dilaudid. Nausea improved, will PO challenge. Will have pt attempt to urinate.  Plan for home d/c with urology f/up.  12:23 AM Successfully able to urinate. Pain and nausea controlled, tolerating PO well. U/A with hematuria and trace leuks but 0-2 WBC, doubt UTI. Rx for flomax, zofran, naprosyn, and percocet given. Urine strainer given. Will have her f/up with urology in 1-2wks. I explained the diagnosis and have given explicit precautions to return to the ER including for any other new or worsening symptoms. The patient understands and accepts the medical plan as it's been dictated and I have answered their questions. Discharge instructions concerning home care and prescriptions have been given. The patient is STABLE and is discharged to home in good condition.  BP 115/66 mmHg  Pulse 62  Temp(Src) 97.8 F (36.6 C) (Oral)  Resp 20  SpO2 94%  Meds ordered this encounter  Medications  . HYDROmorphone (DILAUDID) injection 1 mg    Sig:   . ketorolac (TORADOL) 30 MG/ML injection 30 mg    Sig:   . sodium chloride 0.9 % bolus 1,000 mL    Sig:   .  ondansetron (ZOFRAN) injection 4 mg    Sig:   . HYDROmorphone (DILAUDID) injection 1 mg    Sig:   . ondansetron (ZOFRAN ODT) 4 MG disintegrating tablet    Sig: Take 1 tablet (4 mg total) by mouth every 8 (eight) hours as needed for nausea or vomiting.    Dispense:  15 tablet    Refill:  0    Order Specific Question:  Supervising Provider    Answer:  Noemi Chapel D [[id number]]  . oxyCODONE-acetaminophen (PERCOCET) 5-325 MG per tablet    Sig: Take 1 tablet by mouth every 6 (six) hours as needed for severe pain.    Dispense:  10 tablet    Refill:  0    Order Specific Question:  Supervising Provider    Answer:  Noemi Chapel D [[id number]]  . naproxen (NAPROSYN) 500 MG  tablet    Sig: Take 1 tablet (500 mg total) by mouth 2 (two) times daily as needed for mild pain, moderate pain or headache (TAKE WITH MEALS.).    Dispense:  20 tablet    Refill:  0    Order Specific Question:  Supervising Provider    Answer:  Noemi Chapel D [[id number]]  . tamsulosin (FLOMAX) 0.4 MG CAPS capsule    Sig: Take 1 capsule (0.4 mg total) by mouth daily after supper. Take until stone has passed    Dispense:  15 capsule    Refill:  0    Order Specific Question:  Supervising Provider    Answer:  Johnna Acosta [address] [place], PA-C 03/22/14 0024  Debby Freiberg, MD 03/24/14 [phone]

## 2014-03-21 NOTE — ED Notes (Signed)
Bed: WA05 Expected date:  Expected time:  Means of arrival:  Comments: 

## 2014-03-22 LAB — URINE MICROSCOPIC-ADD ON

## 2014-03-22 LAB — URINALYSIS, ROUTINE W REFLEX MICROSCOPIC
Bilirubin Urine: NEGATIVE
GLUCOSE, UA: NEGATIVE mg/dL
Ketones, ur: NEGATIVE mg/dL
Nitrite: NEGATIVE
PH: 5.5 (ref 5.0–8.0)
Protein, ur: NEGATIVE mg/dL
SPECIFIC GRAVITY, URINE: 1.021 (ref 1.005–1.030)
Urobilinogen, UA: 0.2 mg/dL (ref 0.0–1.0)

## 2014-03-22 MED ORDER — TAMSULOSIN HCL 0.4 MG PO CAPS: 0.4000 mg | ORAL_CAPSULE | Freq: Every day | ORAL | Status: DC

## 2014-03-22 MED ORDER — NAPROXEN 500 MG PO TABS: 500.0000 mg | ORAL_TABLET | Freq: Two times a day (BID) | ORAL | Status: DC | PRN

## 2014-03-22 MED ORDER — OXYCODONE-ACETAMINOPHEN 5-325 MG PO TABS: 1.0000 | ORAL_TABLET | Freq: Four times a day (QID) | ORAL | Status: DC | PRN

## 2014-03-22 MED ORDER — ONDANSETRON 4 MG PO TBDP: 4.0000 mg | ORAL_TABLET | Freq: Three times a day (TID) | ORAL | Status: DC | PRN

## 2014-03-22 NOTE — Discharge Instructions (Signed)
Take naprosyn as directed as needed for inflammation and pain using percocet for breakthrough pain. Do not drive or operate machinery with pain medication use. May need over-the-counter stool softener with this pain medication use. Use Zofran as needed for nausea. Use Flomax as directed, as this medication will help you pass the stone. Strain all your urine. STAY WELL HYDRATED! Avoid sodas or tea/coffee. Followup with urologist in the next 1 to 2 weeks for recheck of ongoing pain, however for intractable or uncontrollable pain at home then return to the emergency department.    Flank Pain Flank pain is pain in your side. The flank is the area of your side between your upper belly (abdomen) and your back. Pain in this area can be caused by many different things. [place] care and treatment will depend on the cause of your pain.  Rest as told by your doctor.  Drink enough fluids to keep your pee (urine) clear or pale yellow.  Only take medicine as told by your doctor.  Tell your doctor about any changes in your pain.  Follow up with your doctor. GET HELP RIGHT AWAY IF:   Your pain does not get better with medicine.   You have new symptoms or your symptoms get worse.  Your pain gets worse.   You have belly (abdominal) pain.   You are short of breath.   You always feel sick to your stomach (nauseous).   You keep throwing up (vomiting).   You have puffiness (swelling) in your belly.   You feel light-headed or you pass out (faint).   You have blood in your pee.  You have a fever or lasting symptoms for more than 2-3 days.  You have a fever and your symptoms suddenly get worse. MAKE SURE YOU:   Understand these instructions.  Will watch your condition.  Will get help right away if you are not doing well or get worse. Document Released: 02/12/2008 Document Revised: 09/19/2013 Document Reviewed: 12/18/2011 [facility] Patient Information 2015 [place], [place].  This information is not intended to replace advice given to you by your health care provider. Make sure you discuss any questions you have with your health care provider.  Dietary Guidelines to Help Prevent Kidney Stones Your risk of kidney stones can be decreased by adjusting the foods you eat. The most important thing you can do is drink enough fluid. You should drink enough fluid to keep your urine clear or pale yellow. The following guidelines provide specific information for the type of kidney stone you have had. GUIDELINES ACCORDING TO TYPE OF KIDNEY STONE Calcium Oxalate Kidney Stones  Reduce the amount of salt you eat. Foods that have a lot of salt cause your body to release excess calcium into your urine. The excess calcium can combine with a substance called oxalate to form kidney stones.  Reduce the amount of animal protein you eat if the amount you eat is excessive. Animal protein causes your body to release excess calcium into your urine. Ask your dietitian how much protein from animal sources you should be eating.  Avoid foods that are high in oxalates. If you take vitamins, they should have less than 500 mg of vitamin C. Your body turns vitamin C into oxalates. You do not need to avoid fruits and vegetables high in vitamin C. Calcium Phosphate Kidney Stones  Reduce the amount of salt you eat to help prevent the release of excess calcium into your urine.  Reduce the amount of  animal protein you eat if the amount you eat is excessive. Animal protein causes your body to release excess calcium into your urine. Ask your dietitian how much protein from animal sources you should be eating.  Get enough calcium from food or take a calcium supplement (ask your dietitian for recommendations). Food sources of calcium that do not increase your risk of kidney stones include:  Broccoli.  Dairy products, such as cheese and yogurt.  Pudding. Uric Acid Kidney Stones  Do not have more than 6  oz of animal protein per day. FOOD SOURCES Animal Protein Sources  Meat (all types).  Poultry.  Eggs.  Fish, seafood. Foods High in [employer] seasonings.  Soy sauce.  Teriyaki sauce.  Cured and processed meats.  Salted crackers and snack foods.  Fast food.  Canned soups and most canned foods. Foods High in Oxalates  Grains:  Amaranth.  Barley.  Grits.  Wheat germ.  Bran.  Buckwheat flour.  All bran cereals.  Pretzels.  Whole wheat bread.  Vegetables:  Beans (wax).  Beets and beet greens.  Collard greens.  Eggplant.  [place].  Leeks.  Okra.  Parsley.  [place]as.  Spinach.  Swiss chard.  Tomato paste.  Fried potatoes.  Sweet potatoes.  Fruits:  Red currants.  Figs.  Kiwi.  Rhubarb.  Meat and Other Protein Sources:  Beans (dried).  Soy burgers and other soybean products.  Miso.  Nuts (peanuts, almonds, pecans, cashews, hazelnuts).  Nut butters.  Sesame seeds and tahini (paste made of sesame seeds).  Poppy seeds.  Beverages:  Chocolate drink mixes.  Soy milk.  Instant iced tea.  Juices made from high-oxalate fruits or vegetables.  Other:  Carob.  Chocolate.  Fruitcake.  Marmalades. Document Released: 08/30/2010 Document Revised: 05/10/2013 Document Reviewed: 04/01/2013 [facility] Patient Information 2015 [place], [place]. This information is not intended to replace advice given to you by your health care provider. Make sure you discuss any questions you have with your health care provider.  Kidney Stones Kidney stones (urolithiasis) are deposits that form inside your kidneys. The intense pain is caused by the stone moving through the urinary tract. When the stone moves, the ureter goes into spasm around the stone. The stone is usually passed in the urine.  CAUSES   A disorder that makes certain neck glands produce too much parathyroid hormone (primary hyperparathyroidism).  A buildup of uric  acid crystals, similar to gout in your joints.  Narrowing (stricture) of the ureter.  A kidney obstruction present at birth (congenital obstruction).  Previous surgery on the kidney or ureters.  Numerous kidney infections. SYMPTOMS   Feeling sick to your stomach (nauseous).  Throwing up (vomiting).  Blood in the urine (hematuria).  Pain that usually spreads (radiates) to the groin.  Frequency or urgency of urination. DIAGNOSIS   Taking a history and physical exam.  Blood or urine tests.  CT scan.  Occasionally, an examination of the inside of the urinary bladder (cystoscopy) is performed. TREATMENT   Observation.  Increasing your fluid intake.  Extracorporeal shock wave lithotripsy--This is a noninvasive procedure that uses shock waves to break up kidney stones.  Surgery may be needed if you have severe pain or persistent obstruction. There are various surgical procedures. Most of the procedures are performed with the use of small instruments. Only small incisions are needed to accommodate these instruments, so recovery time is minimized. The size, location, and chemical composition are all important variables that will determine the proper choice of  action for you. Talk to your health care provider to better understand your situation so that you will minimize the risk of injury to yourself and your kidney.  HOME CARE INSTRUCTIONS   Drink enough water and fluids to keep your urine clear or pale yellow. This will help you to pass the stone or stone fragments.  Strain all urine through the provided strainer. Keep all particulate matter and stones for your health care provider to see. The stone causing the pain may be as small as a grain of salt. It is very important to use the strainer each and every time you pass your urine. The collection of your stone will allow your health care provider to analyze it and verify that a stone has actually passed. The stone analysis will  often identify what you can do to reduce the incidence of recurrences.  Only take over-the-counter or prescription medicines for pain, discomfort, or fever as directed by your health care provider.  Make a follow-up appointment with your health care provider as directed.  Get follow-up X-rays if required. The absence of pain does not always mean that the stone has passed. It may have only stopped moving. If the urine remains completely obstructed, it can cause loss of kidney function or even complete destruction of the kidney. It is your responsibility to make sure X-rays and follow-ups are completed. Ultrasounds of the kidney can show blockages and the status of the kidney. Ultrasounds are not associated with any radiation and can be performed easily in a matter of minutes. SEEK MEDICAL CARE IF:  You experience pain that is progressive and unresponsive to any pain medicine you have been prescribed. SEEK IMMEDIATE MEDICAL CARE IF:   Pain cannot be controlled with the prescribed medicine.  You have a fever or shaking chills.  The severity or intensity of pain increases over 18 hours and is not relieved by pain medicine.  You develop a new onset of abdominal pain.  You feel faint or pass out.  You are unable to urinate. MAKE SURE YOU:   Understand these instructions.  Will watch your condition.  Will get help right away if you are not doing well or get worse. Document Released: 05/05/2005 Document Revised: 01/05/2013 Document Reviewed: 10/06/2012 [facility] Patient Information 2015 [place], [place]. This information is not intended to replace advice given to you by your health care provider. Make sure you discuss any questions you have with your health care provider.  Low-Purine Diet Purines are compounds that affect the level of uric acid in your body. A low-purine diet is a diet that is low in purines. Eating a low-purine diet can prevent the level of uric acid in your body from  getting too high and causing gout or kidney stones or both. WHAT DO I NEED TO KNOW ABOUT THIS DIET?  Choose low-purine foods. Examples of low-purine foods are listed in the next section.  Drink plenty of fluids, especially water. Fluids can help remove uric acid from your body. Try to drink 8-16 cups (1.9-3.8 L) a day.  Limit foods high in fat, especially saturated fat, as fat makes it harder for the body to get rid of uric acid. Foods high in saturated fat include pizza, cheese, ice cream, whole milk, fried foods, and gravies. Choose foods that are lower in fat and lean sources of protein. Use olive oil when cooking as it contains healthy fats that are not high in saturated fat.  Limit alcohol. Alcohol interferes with the elimination  of uric acid from your body. If you are having a gout attack, avoid all alcohol.  Keep in mind that different people's bodies react differently to different foods. You will probably learn over time which foods do or do not affect you. If you discover that a food tends to cause your gout to flare up, avoid eating that food. You can more freely enjoy foods that do not cause problems. If you have any questions about a food item, talk to your dietitian or health care provider. WHICH FOODS ARE LOW, MODERATE, AND HIGH IN PURINES? The following is a list of foods that are low, moderate, and high in purines. You can eat any amount of the foods that are low in purines. You may be able to have small amounts of foods that are moderate in purines. Ask your health care provider how much of a food moderate in purines you can have. Avoid foods high in purines. Grains  Foods low in purines: Enriched white bread, pasta, rice, cake, cornbread, popcorn.  Foods moderate in purines: Whole-grain breads and cereals, wheat germ, bran, oatmeal. Uncooked oatmeal. Dry wheat bran or wheat germ.  Foods high in purines: Pancakes, [place] toast, biscuits, muffins. Vegetables  Foods low in  purines: All vegetables, except those that are moderate in purines.  Foods moderate in purines: Asparagus, cauliflower, spinach, mushrooms, green peas. Fruits  All fruits are low in purines. Meats and other Protein Foods  Foods low in purines: Eggs, nuts, peanut butter.  Foods moderate in purines: 80-90% lean beef, lamb, veal, pork, poultry, fish, eggs, peanut butter, nuts. Crab, lobster, oysters, and shrimp. Cooked dried beans, peas, and lentils.  Foods high in purines: Anchovies, sardines, herring, mussels, tuna, codfish, scallops, trout, and haddock. Berniece Salines. Organ meats (such as liver or kidney). Tripe. Game meat. Goose. Sweetbreads. Dairy  All dairy foods are low in purines. Low-fat and fat-free dairy products are best because they are low in saturated fat. Beverages  Drinks low in purines: Water, carbonated beverages, tea, coffee, cocoa.  Drinks moderate in purines: Soft drinks and other drinks sweetened with high-fructose corn syrup. Juices. To find whether a food or drink is sweetened with high-fructose corn syrup, look at the ingredients list.  Drinks high in purines: Alcoholic beverages (such as beer). Condiments  Foods low in purines: Salt, herbs, olives, pickles, relishes, vinegar.  Foods moderate in purines: Butter, margarine, oils, mayonnaise. Fats and Oils  Foods low in purines: All types, except gravies and sauces made with meat.  Foods high in purines: Gravies and sauces made with meat. Other Foods  Foods low in purines: Sugars, sweets, gelatin. Cake. Soups made without meat.  Foods moderate in purines: Meat-based or fish-based soups, broths, or bouillons. Foods and drinks sweetened with high-fructose corn syrup.  Foods high in purines: High-fat desserts (such as ice cream, cookies, cakes, pies, doughnuts, and chocolate). Contact your dietitian for more information on foods that are not listed here. Document Released: 08/30/2010 Document Revised: 05/10/2013  Document Reviewed: 04/11/2013 [facility] Patient Information 2015 [place], [place]. This information is not intended to replace advice given to you by your health care provider. Make sure you discuss any questions you have with your health care provider.

## 2014-03-24 LAB — URINE CULTURE
Colony Count: 40000
Special Requests: NORMAL

## 2014-05-09 ENCOUNTER — Other Ambulatory Visit: Payer: Self-pay | Admitting: Obstetrics and Gynecology

## 2014-05-10 LAB — CYTOLOGY - PAP

## 2014-07-19 ENCOUNTER — Other Ambulatory Visit (INDEPENDENT_AMBULATORY_CARE_PROVIDER_SITE_OTHER): Payer: Self-pay | Admitting: Surgery

## 2014-07-19 DIAGNOSIS — Z9289 Personal history of other medical treatment: Secondary | ICD-10-CM

## 2014-07-19 DIAGNOSIS — Z1231 Encounter for screening mammogram for malignant neoplasm of breast: Secondary | ICD-10-CM

## 2014-08-03 ENCOUNTER — Encounter: Payer: Self-pay | Admitting: Dietician

## 2014-08-03 ENCOUNTER — Encounter: Payer: PRIVATE HEALTH INSURANCE | Attending: Surgery | Admitting: Dietician

## 2014-08-03 DIAGNOSIS — Z713 Dietary counseling and surveillance: Secondary | ICD-10-CM | POA: Diagnosis not present

## 2014-08-03 DIAGNOSIS — Z6841 Body Mass Index (BMI) 40.0 and over, adult: Secondary | ICD-10-CM | POA: Insufficient documentation

## 2014-08-03 NOTE — Patient Instructions (Signed)
Follow Pre-Op Goals Try Protein Shakes Call [facility] at [phone] when surgery is scheduled to enroll in Pre-Op Class  Things to remember:  Please always be honest with us. We want to support you!  If you have any questions or concerns in between appointments, please call or email Liz, Odie Edmonds, or Laurie.  The diet after surgery will be high protein and low in carbohydrate.  Vitamins and calcium need to be taken for the rest of your life.  Feel free to include support people in any classes or appointments.  

## 2014-08-03 NOTE — Progress Notes (Signed)
  Pre-Op Assessment Visit:  Pre-Operative Gastric sleeve Surgery  Medical Nutrition Therapy:  Appt start time: 6294   End time:  1200.  Patient was seen on 08/03/2014 for Pre-Operative Nutrition Assessment. Assessment and letter of approval faxed to [facility] Bariatric [facility] Program coordinator on 08/03/2014.   Preferred Learning Style:   No preference indicated   Learning Readiness:   Ready  Handouts given during visit include:  Pre-Op Goals Bariatric Surgery Protein Shakes   During the appointment today the following Pre-Op Goals were reviewed with the patient: Maintain or lose weight as instructed by your surgeon Make healthy food choices Begin to limit portion sizes Limited concentrated sugars and fried foods Keep fat/sugar in the single digits per serving on   food labels Practice CHEWING your food  (aim for 30 chews per bite or until applesauce consistency) Practice not drinking 15 minutes before, during, and 30 minutes after each meal/snack Avoid all carbonated beverages  Avoid/limit caffeinated beverages  Avoid all sugar-sweetened beverages Consume 3 meals per day; eat every 3-5 hours Make a list of non-food related activities Aim for 64-100 ounces of FLUID daily  Aim for at least 60-80 grams of PROTEIN daily Look for a liquid protein source that contain ?15 g protein and ?5 g carbohydrate  (ex: shakes, drinks, shots)  Patient-Centered Goals: -Playing with grandkids -More energy -Overall health -Being more comfortable and confident  Demonstrated degree of understanding via:  Teach Back  Teaching Method Utilized:  Visual Auditory Hands on  Barriers to learning/adherence to lifestyle change: none  [facility] to [facility] to enroll in Pre-Op and Post-Op Nutrition Education when surgery date is scheduled.

## 2014-08-11 ENCOUNTER — Ambulatory Visit (HOSPITAL_COMMUNITY)
Admission: RE | Admit: 2014-08-11 | Discharge: 2014-08-11 | Disposition: A | Discharge status: Home / Self Care | Payer: PRIVATE HEALTH INSURANCE | Source: Ambulatory Visit | Attending: Surgery | Admitting: Surgery

## 2014-08-11 DIAGNOSIS — Z01818 Encounter for other preprocedural examination: Secondary | ICD-10-CM | POA: Diagnosis not present

## 2014-08-31 ENCOUNTER — Encounter (HOSPITAL_COMMUNITY)
Admission: RE | Disposition: A | Discharge status: Home / Self Care | Payer: Self-pay | Source: Ambulatory Visit | Attending: Surgery

## 2014-08-31 ENCOUNTER — Ambulatory Visit (HOSPITAL_COMMUNITY)
Admission: RE | Admit: 2014-08-31 | Discharge: 2014-08-31 | Disposition: A | Discharge status: Home / Self Care | Payer: PRIVATE HEALTH INSURANCE | Source: Ambulatory Visit | Attending: Surgery | Admitting: Surgery

## 2014-08-31 HISTORY — PX: BREATH TEK H PYLORI: SHX5422

## 2014-08-31 SURGERY — BREATH TEST, FOR HELICOBACTER PYLORI

## 2014-08-31 NOTE — Progress Notes (Signed)
   08/31/14 [place]  Referring MD Lucia Gaskins  Time of Last PO Intake 2300  Baseline Breath At: 0730  Pranactin Given At: 0730  Post-Dose Breath At: 0745  Sample 1 3.1  Sample 2 2.3  Test Negative

## 2014-09-01 ENCOUNTER — Encounter (HOSPITAL_COMMUNITY): Payer: Self-pay | Admitting: Surgery

## 2014-09-04 ENCOUNTER — Encounter: Payer: PRIVATE HEALTH INSURANCE | Attending: Surgery | Admitting: Dietician

## 2014-09-04 ENCOUNTER — Ambulatory Visit: Payer: PRIVATE HEALTH INSURANCE

## 2014-09-04 DIAGNOSIS — Z6841 Body Mass Index (BMI) 40.0 and over, adult: Secondary | ICD-10-CM | POA: Insufficient documentation

## 2014-09-04 DIAGNOSIS — Z713 Dietary counseling and surveillance: Secondary | ICD-10-CM | POA: Diagnosis not present

## 2014-09-04 NOTE — Progress Notes (Signed)
  6 Months Supervised Weight Loss Visit:   Pre-Operative Sleeve Surgery  Medical Nutrition Therapy:  Appt start time: 7588 end time:  3254.  Primary concerns today: Supervised Weight Loss Visit. Gained 4 lbs since last visit. Has been working on chewing foods well, not drinking while eating, and watching her serving sizes. Not yet eating 3 meals per day (skips breakfast). Has been drinking water and unsweet tea. Has not tried protein shakes yet.   Wt Readings from Last 3 Encounters:  09/04/14 229 lb 14.4 oz (104.282 kg)  08/03/14 225 lb 12.8 oz (102.422 kg)  01/13/14 230 lb (104.327 kg)   Ht Readings from Last 3 Encounters:  09/04/14 5\' 2"  (1.575 m)  08/03/14 5\' 2"  (1.575 m)  01/13/14 5\' 4"  (1.626 m)   Body mass index is 42.04 kg/(m^2). @BMIFA @ Normalized weight-for-age data available only for age 13 to 51 years. Normalized stature-for-age data available only for age 13 to 48 years.   Preferred Learning Style:   No preference indicated   Learning Readiness:   Ready  Medications: see list   Recent physical activity:  Walking every day for 15-30 minutes  Progress Towards Goal(s):  In progress.    Nutritional Diagnosis:  [place]-3.3 Obesity related to past poor dietary habits and physical inactivity as evidenced by patient attending supervised weight loss for insurance approval of bariatric surgery.    Intervention:  Nutrition counseling provided. Plan: Aim to have 3 meals per day. Try having a protein shake for breakfast. Continue working on limiting carbs (bread).  Teaching Method Utilized:  Visual Auditory Hands on  Barriers to learning/adherence to lifestyle change: none  Demonstrated degree of understanding via:  Teach Back   Monitoring/Evaluation:  Dietary intake, exercise, and body weight. Follow up in 1 months for 6 month supervised weight loss visit.

## 2014-09-04 NOTE — Patient Instructions (Signed)
Aim to have 3 meals per day. Try having a protein shake for breakfast. Continue working on limiting carbs (bread).

## 2014-10-02 ENCOUNTER — Encounter: Payer: PRIVATE HEALTH INSURANCE | Attending: Surgery | Admitting: Dietician

## 2014-10-02 ENCOUNTER — Encounter: Payer: Self-pay | Admitting: Dietician

## 2014-10-02 VITALS — Ht 62.0 in | Wt 234.6 lb

## 2014-10-02 DIAGNOSIS — Z713 Dietary counseling and surveillance: Secondary | ICD-10-CM | POA: Diagnosis not present

## 2014-10-02 DIAGNOSIS — Z6841 Body Mass Index (BMI) 40.0 and over, adult: Secondary | ICD-10-CM | POA: Insufficient documentation

## 2014-10-02 DIAGNOSIS — E669 Obesity, unspecified: Secondary | ICD-10-CM

## 2014-10-02 NOTE — Patient Instructions (Addendum)
Continue working on limiting carbs (bread). Try Premier Protein shakes. Cut back on portion sizes. Start phasing out sweets. Continue not drinking during meals and keep working on chewing well. Plan to walk every day for 15-30 minutes.

## 2014-10-02 NOTE — Progress Notes (Signed)
  6 Months Supervised Weight Loss Visit:   Pre-Operative Sleeve Surgery  Medical Nutrition Therapy:  Appt start time: 7026 end time:  3785.  Primary concerns today: Supervised Weight Loss Visit #2. Gained 5 lbs since last visit. Has had a "lot of celebrations this past month. Walking less than before. Eating breakfast now - toast with an egg or protein shake (not sure of name but carbs are 15 g) or peaches ad cottage cheese.   Has been drinking water and unsweet tea.   Wt Readings from Last 3 Encounters:  10/02/14 234 lb 9.6 oz (106.414 kg)  09/04/14 229 lb 14.4 oz (104.282 kg)  08/03/14 225 lb 12.8 oz (102.422 kg)   Ht Readings from Last 3 Encounters:  10/02/14 5\' 2"  (1.575 m)  09/04/14 5\' 2"  (1.575 m)  08/03/14 5\' 2"  (1.575 m)   Body mass index is 42.9 kg/(m^2). @BMIFA @ Normalized weight-for-age data available only for age 74 to 43 years. Normalized stature-for-age data available only for age 74 to 38 years.  Preferred Learning Style:   No preference indicated   Learning Readiness:   Ready  Medications: see list   Recent physical activity:  Walking 2-3 x week for 15-30 minutes  Progress Towards Goal(s):  In progress.    Nutritional Diagnosis:  [place]-3.3 Obesity related to past poor dietary habits and physical inactivity as evidenced by patient attending supervised weight loss for insurance approval of bariatric surgery.    Intervention:  Nutrition counseling provided. Plan: Continue working on limiting carbs (bread). Try Premier Protein shakes. Cut back on portion sizes. Start phasing out sweets. Continue not drinking during meals and keep working on chewing well. Plan to walk every day for 15-30 minutes.  Teaching Method Utilized:  Visual Auditory Hands on  Barriers to learning/adherence to lifestyle change: none  Demonstrated degree of understanding via:  Teach Back   Monitoring/Evaluation:  Dietary intake, exercise, and body weight. Follow up in 1 months for  6 month supervised weight loss visit.

## 2014-10-30 ENCOUNTER — Encounter: Payer: Self-pay | Admitting: Dietician

## 2014-10-30 ENCOUNTER — Ambulatory Visit: Payer: PRIVATE HEALTH INSURANCE | Admitting: Dietician

## 2014-10-30 ENCOUNTER — Encounter: Payer: PRIVATE HEALTH INSURANCE | Attending: Surgery | Admitting: Dietician

## 2014-10-30 VITALS — Ht 62.0 in | Wt 233.8 lb

## 2014-10-30 DIAGNOSIS — Z6841 Body Mass Index (BMI) 40.0 and over, adult: Secondary | ICD-10-CM | POA: Insufficient documentation

## 2014-10-30 DIAGNOSIS — Z713 Dietary counseling and surveillance: Secondary | ICD-10-CM | POA: Insufficient documentation

## 2014-10-30 DIAGNOSIS — E669 Obesity, unspecified: Secondary | ICD-10-CM

## 2014-10-30 NOTE — Progress Notes (Signed)
  6 Months Supervised Weight Loss Visit:   Pre-Operative Sleeve Surgery  Medical Nutrition Therapy:  Appt start time: 9147 end time:  8295.  Primary concerns today: Supervised Weight Loss Visit #3. Lost 1 lb since last visit. Has been walking more - 4 x week. [place]. No longer having seconds.   Has been drinking water and unsweet tea.    Preferred Learning Style:   No preference indicated   Learning Readiness:   Ready  Medications: see list   Recent physical activity:  Walking 4 x week for 30-45 minutes and exercise in pool every day for 60 minutes  Progress Towards Goal(s):  In progress.    Nutritional Diagnosis:  [place]-3.3 Obesity related to past poor dietary habits and physical inactivity as evidenced by patient attending supervised weight loss for insurance approval of bariatric surgery.    Intervention:  Nutrition counseling provided. Plan: Continue working on limiting carbs (bread). Cut back on portion sizes. Continue not drinking during meals and keep working on chewing well. Continue to walk and/or use pool most days.  Look for yogurt to have less than 15 g of carbs per serving. Start taking a multivitamin. Start looking for decaf tea to switch to.   Teaching Method Utilized:  Visual Auditory Hands on  Barriers to learning/adherence to lifestyle change: none  Demonstrated degree of understanding via:  Teach Back   Monitoring/Evaluation:  Dietary intake, exercise, and body weight. Follow up in 1 months for 6 month supervised weight loss visit.

## 2014-10-30 NOTE — Patient Instructions (Addendum)
Continue working on limiting carbs (bread). Cut back on portion sizes. Continue not drinking during meals and keep working on chewing well. Continue to walk and/or use pool most days.  Look for yogurt to have less than 15 g of carbs per serving. Start taking a multivitamin. Start looking for decaf tea to switch to.

## 2014-11-03 ENCOUNTER — Ambulatory Visit
Admission: RE | Admit: 2014-11-03 | Discharge: 2014-11-03 | Disposition: A | Discharge status: Home / Self Care | Payer: PRIVATE HEALTH INSURANCE | Source: Ambulatory Visit | Attending: Internal Medicine | Admitting: Internal Medicine

## 2014-11-03 ENCOUNTER — Other Ambulatory Visit: Payer: Self-pay | Admitting: Internal Medicine

## 2014-11-03 DIAGNOSIS — R1010 Upper abdominal pain, unspecified: Secondary | ICD-10-CM

## 2014-11-03 MED ORDER — GADOBENATE DIMEGLUMINE 529 MG/ML IV SOLN
20.0000 mL | Freq: Once | INTRAVENOUS | Status: AC | PRN
  Administered 2014-11-03: 20 mL via INTRAVENOUS

## 2014-11-27 ENCOUNTER — Encounter: Payer: Self-pay | Admitting: Dietician

## 2014-11-27 ENCOUNTER — Ambulatory Visit: Payer: PRIVATE HEALTH INSURANCE | Admitting: Dietician

## 2014-11-27 ENCOUNTER — Encounter: Payer: PRIVATE HEALTH INSURANCE | Attending: Surgery | Admitting: Dietician

## 2014-11-27 DIAGNOSIS — Z713 Dietary counseling and surveillance: Secondary | ICD-10-CM | POA: Diagnosis not present

## 2014-11-27 DIAGNOSIS — Z6841 Body Mass Index (BMI) 40.0 and over, adult: Secondary | ICD-10-CM | POA: Diagnosis not present

## 2014-11-27 NOTE — Patient Instructions (Addendum)
Continue working on limiting carbs (bread). Continue cutting back on portion sizes. Continue not drinking during meals and keep working on chewing well. Plan to go the gym 5 x week. Start taking a complete multivitamin.

## 2014-11-27 NOTE — Progress Notes (Signed)
  6 Months Supervised Weight Loss Visit:   Pre-Operative Sleeve Surgery  Medical Nutrition Therapy:  Appt start time: 5397 end time:  6734.  Primary concerns today: Supervised Weight Loss Visit #4. No weight change since last visit. Has joined [employer] and will start with her trainer tomorrow. Still practicing not drinking during meals. Has been limiting carbs.  Has been drinking water and unsweet tea. Has been drinking 2 Atkins Lifts a day.   Preferred Learning Style:   No preference indicated   Learning Readiness:   Ready  Medications: see list   Recent physical activity:  Walking 2-3 x week for 30 minutes and exercise in pool every day for 60 minutes  Progress Towards Goal(s):  In progress.    Nutritional Diagnosis:  [place]-3.3 Obesity related to past poor dietary habits and physical inactivity as evidenced by patient attending supervised weight loss for insurance approval of bariatric surgery.    Intervention:  Nutrition counseling provided. Plan: Continue working on limiting carbs (bread). Continue cutting back on portion sizes. Continue not drinking during meals and keep working on chewing well. Plan to go the gym 5 x week. Start taking a complete multivitamin.   Teaching Method Utilized:  Visual Auditory Hands on  Barriers to learning/adherence to lifestyle change: none  Demonstrated degree of understanding via:  Teach Back   Monitoring/Evaluation:  Dietary intake, exercise, and body weight. Follow up in 1 months for 6 month supervised weight loss visit.

## 2014-12-04 ENCOUNTER — Ambulatory Visit: Payer: PRIVATE HEALTH INSURANCE

## 2014-12-25 ENCOUNTER — Encounter: Payer: PRIVATE HEALTH INSURANCE | Attending: Surgery | Admitting: Dietician

## 2014-12-25 ENCOUNTER — Encounter: Payer: Self-pay | Admitting: Dietician

## 2014-12-25 DIAGNOSIS — Z713 Dietary counseling and surveillance: Secondary | ICD-10-CM | POA: Insufficient documentation

## 2014-12-25 DIAGNOSIS — Z6841 Body Mass Index (BMI) 40.0 and over, adult: Secondary | ICD-10-CM | POA: Diagnosis not present

## 2014-12-25 NOTE — Progress Notes (Signed)
  6 Months Supervised Weight Loss Visit:   Pre-Operative Sleeve Surgery  Medical Nutrition Therapy:  Appt start time: 415 end time:  430.  Primary concerns today: Supervised Weight Loss Visit #5. No weight change since last visit. Feels like things are going well. Started taking multivitamins. Having less carbs. Chewing well, not drinking during meals. Going to the gym 2 x week.  Getting out of breath easily with exercise so taking it slowly.   Preferred Learning Style:   No preference indicated   Learning Readiness:   Ready  Medications: see list   Recent physical activity:  Working out with trainer for 10 minutes 2 x week  with trainer, walks with friend 15-20 minutes at night, and exercise in pool every day for 60 minutes  Progress Towards Goal(s):  In progress.    Nutritional Diagnosis:  [place]-3.3 Obesity related to past poor dietary habits and physical inactivity as evidenced by patient attending supervised weight loss for insurance approval of bariatric surgery.    Intervention:  Nutrition counseling provided. Plan: Continue working on limiting carbs (bread). Continue cutting back on portion sizes. Continue not drinking during meals and keep working on chewing well. Continue to go the gym 2 x week.   Teaching Method Utilized:  Visual Auditory Hands on  Barriers to learning/adherence to lifestyle change: none  Demonstrated degree of understanding via:  Teach Back   Monitoring/Evaluation:  Dietary intake, exercise, and body weight. Follow up in 1 months for 6 month supervised weight loss visit.

## 2014-12-25 NOTE — Patient Instructions (Signed)
Continue working on limiting carbs (bread). Continue cutting back on portion sizes. Continue not drinking during meals and keep working on chewing well. Continue to go the gym 2 x week.

## 2014-12-27 ENCOUNTER — Ambulatory Visit
Admission: RE | Admit: 2014-12-27 | Discharge: 2014-12-27 | Disposition: A | Discharge status: Home / Self Care | Payer: PRIVATE HEALTH INSURANCE | Source: Ambulatory Visit | Attending: Surgery | Admitting: Surgery

## 2015-01-23 ENCOUNTER — Encounter: Payer: PRIVATE HEALTH INSURANCE | Attending: Surgery | Admitting: Dietician

## 2015-01-23 ENCOUNTER — Encounter: Payer: Self-pay | Admitting: Dietician

## 2015-01-23 VITALS — Ht 62.0 in | Wt 236.4 lb

## 2015-01-23 DIAGNOSIS — Z713 Dietary counseling and surveillance: Secondary | ICD-10-CM | POA: Insufficient documentation

## 2015-01-23 DIAGNOSIS — E669 Obesity, unspecified: Secondary | ICD-10-CM

## 2015-01-23 DIAGNOSIS — Z6841 Body Mass Index (BMI) 40.0 and over, adult: Secondary | ICD-10-CM | POA: Insufficient documentation

## 2015-01-23 NOTE — Progress Notes (Signed)
  6 Months Supervised Weight Loss Visit:   Pre-Operative Sleeve Surgery  Medical Nutrition Therapy:  Appt start time: 510 end time:  525.  Primary concerns today: Supervised Weight Loss Visit #6. Returns with a 3 lb weight loss. Feels like things are going well. Started taking multivitamins. Having less carbs. Chewing well, not drinking during meals. Still getting out of breath easily. Talked to doctor and it is from being overweight. Exercising and walking but not going the gym.   Feeling ready for surgery.   Preferred Learning Style:   No preference indicated   Learning Readiness:   Ready  Medications: see list   Recent physical activity:  walks with friend 15-20 minutes at night, and exercise in pool every day for 60 minutes  Progress Towards Goal(s):  In progress.    Nutritional Diagnosis:  [place]-3.3 Obesity related to past poor dietary habits and physical inactivity as evidenced by patient attending supervised weight loss for insurance approval of bariatric surgery.    Intervention:  Nutrition counseling provided. Plan: Continue working on limiting carbs (bread). Continue cutting back on portion sizes. Continue not drinking during meals and keep working on chewing well. Continue to exercise most days (pool and walking).    Teaching Method Utilized:  Visual Auditory Hands on  Barriers to learning/adherence to lifestyle change: none  Demonstrated degree of understanding via:  Teach Back   Monitoring/Evaluation:  Dietary intake, exercise, and body weight. Follow up to attend Pre-Op Class.

## 2015-01-23 NOTE — Patient Instructions (Addendum)
Continue working on limiting carbs (bread). Continue cutting back on portion sizes. Continue not drinking during meals and keep working on chewing well. Continue to exercise most days (pool and walking).   Call [facility] at [phone] when surgery is scheduled to enroll in Pre-Op Class

## 2015-02-06 ENCOUNTER — Telehealth: Payer: Self-pay | Admitting: Dietician

## 2015-02-06 NOTE — Telephone Encounter (Signed)
Patient was faxed a copy of the pre op diet on 02/05/2015 and requested to begin the diet at 2 weeks pre op bariatric surgery.

## 2015-02-08 NOTE — Progress Notes (Signed)
Please put orders in Epic surgery 02-20-15 pre op 02-19-15 Thanks

## 2015-02-12 ENCOUNTER — Encounter: Payer: PRIVATE HEALTH INSURANCE | Admitting: Dietician

## 2015-02-14 NOTE — Progress Notes (Signed)
  HPI Review of Systems   Physical Exam    Pre-Operative Nutrition Class:  Appt start time: 3312   End time:  1830.  Patient was seen on 02/12/15 for Pre-Operative Bariatric Surgery [facility]   Surgery date: 02/20/15 Surgery type: Sleeve gastrectomy Start weight at [facility]: 226 lbs on 08/03/14 Weight today: 233.7 lbs  TANITA  BODY COMP RESULTS  02/12/15   BMI (kg/m^2) N/A   Fat Mass (lbs)    Fat Free Mass (lbs)    Total Body Water (lbs)     The following the learning objectives were met by the patient during this course:  Identify Pre-Op Dietary Goals and will begin 2 weeks pre-operatively  Identify appropriate sources of fluids and proteins   State protein recommendations and appropriate sources pre and post-operatively  Identify Post-Operative Dietary Goals and will follow for 2 weeks post-operatively  Identify appropriate multivitamin and calcium sources  Describe the need for physical activity post-operatively and will follow MD recommendations  State when to call healthcare provider regarding medication questions or post-operative complications  Handouts given during class include:  Pre-Op Bariatric Surgery Diet Handout  Protein Shake Handout  Post-Op Bariatric Surgery Nutrition Handout  BELT Program Information Flyer  Support Group Information Flyer  [facility] Bariatric Supplements Price List  Follow-Up Plan: Patient will follow-up at [facility] 2 weeks post operatively for diet advancement per MD.

## 2015-02-15 NOTE — Patient Instructions (Signed)
KADEJA GRANADA  02/15/2015   Your procedure is scheduled on: Tuesday 02/20/2015  Report to [facility]  Entrance take [place]  elevators to 3rd floor to  [place] at [place]  AM.  Call this number if you have problems the morning of surgery [phone]   Remember: ONLY 1 PERSON MAY GO WITH YOU TO SHORT STAY TO GET  READY MORNING OF [place].  Do not eat food or drink liquids :After Midnight.     Take these medicines the morning of surgery with A SIP OF WATER: Amlodipine (Norvasc)  DO NOT TAKE ANY DIABETIC MEDICATIONS DAY OF YOUR SURGERY                               You may not have any metal on your body including hair pins and              piercings  Do not wear jewelry, make-up, lotions, powders or perfumes, deodorant             Do not wear nail polish.  Do not shave  48 hours prior to surgery.              Men may shave face and neck.   Do not bring valuables to the hospital. [place].  Contacts, dentures or bridgework may not be worn into surgery.  Leave suitcase in the car. After surgery it may be brought to your room.     Patients discharged the day of surgery will not be allowed to drive home.  Name and phone number of your driver:  Special Instructions: N/A              Please read over the following fact sheets you were given: _____________________________________________________________________             [facility] - Preparing for Surgery Before surgery, you can play an important role.  Because skin is not sterile, your skin needs to be as free of germs as possible.  You can reduce the number of germs on your skin by washing with CHG (chlorahexidine gluconate) soap before surgery.  CHG is an antiseptic cleaner which kills germs and bonds with the skin to continue killing germs even after washing. Please DO NOT use if you have an allergy to CHG or antibacterial soaps.  If your  skin becomes reddened/irritated stop using the CHG and inform your nurse when you arrive at Short Stay. Do not shave (including legs and underarms) for at least 48 hours prior to the first CHG shower.  You may shave your face/neck. Please follow these instructions carefully:  1.  Shower with CHG Soap the night before surgery and the  morning of Surgery.  2.  If you choose to wash your hair, wash your hair first as usual with your  normal  shampoo.  3.  After you shampoo, rinse your hair and body thoroughly to remove the  shampoo.                           4.  Use CHG as you would any other liquid soap.  You can apply chg directly  to the skin  and wash                       Gently with a scrungie or clean washcloth.  5.  Apply the CHG Soap to your body ONLY FROM THE NECK DOWN.   Do not use on face/ open                           Wound or open sores. Avoid contact with eyes, ears mouth and genitals (private parts).                       Wash face,  Genitals (private parts) with your normal soap.             6.  Wash thoroughly, paying special attention to the area where your surgery  will be performed.  7.  Thoroughly rinse your body with warm water from the neck down.  8.  DO NOT shower/wash with your normal soap after using and rinsing off  the CHG Soap.                9.  Pat yourself dry with a clean towel.            10.  Wear clean pajamas.            11.  Place clean sheets on your bed the night of your first shower and do not  sleep with pets. Day of Surgery : Do not apply any lotions/deodorants the morning of surgery.  Please wear clean clothes to the hospital/surgery center.  FAILURE TO FOLLOW THESE INSTRUCTIONS MAY RESULT IN THE CANCELLATION OF YOUR SURGERY PATIENT SIGNATURE_________________________________  NURSE SIGNATURE__________________________________  ________________________________________________________________________

## 2015-02-16 ENCOUNTER — Other Ambulatory Visit: Payer: Self-pay | Admitting: Surgery

## 2015-02-19 ENCOUNTER — Encounter (HOSPITAL_COMMUNITY)
Admission: RE | Admit: 2015-02-19 | Discharge: 2015-02-19 | Disposition: A | Discharge status: Home / Self Care | Payer: PRIVATE HEALTH INSURANCE | Source: Ambulatory Visit | Attending: Surgery | Admitting: Surgery

## 2015-02-19 ENCOUNTER — Encounter (HOSPITAL_COMMUNITY): Payer: Self-pay

## 2015-02-19 HISTORY — DX: Major depressive disorder, single episode, unspecified: F32.9

## 2015-02-19 HISTORY — DX: Depression, unspecified: F32.A

## 2015-02-19 HISTORY — DX: Anxiety disorder, unspecified: F41.9

## 2015-02-19 LAB — COMPREHENSIVE METABOLIC PANEL
ALBUMIN: 4.6 g/dL (ref 3.5–5.0)
ALK PHOS: 98 U/L (ref 38–126)
ALT: 47 U/L (ref 14–54)
AST: 35 U/L (ref 15–41)
Anion gap: 7 (ref 5–15)
BILIRUBIN TOTAL: 1.4 mg/dL — AB (ref 0.3–1.2)
BUN: 18 mg/dL (ref 6–20)
CALCIUM: 9.2 mg/dL (ref 8.9–10.3)
CO2: 27 mmol/L (ref 22–32)
Chloride: 107 mmol/L (ref 101–111)
Creatinine, Ser: 0.77 mg/dL (ref 0.44–1.00)
GFR calc Af Amer: >60 mL/min (ref >60)
GLUCOSE: 91 mg/dL (ref 65–99)
POTASSIUM: 4.1 mmol/L (ref 3.5–5.1)
Sodium: 141 mmol/L (ref 135–145)
TOTAL PROTEIN: 7.5 g/dL (ref 6.5–8.1)

## 2015-02-19 LAB — CBC WITH DIFFERENTIAL/PLATELET
BASOS PCT: 0 %
Basophils Absolute: 0 10*3/uL (ref 0.0–0.1)
Eosinophils Absolute: 0.1 10*3/uL (ref 0.0–0.7)
Eosinophils Relative: 1 %
HEMATOCRIT: 44.9 % (ref 36.0–46.0)
HEMOGLOBIN: 14.7 g/dL (ref 12.0–15.0)
LYMPHS PCT: 38 %
Lymphs Abs: 2.3 10*3/uL (ref 0.7–4.0)
MCH: 29.6 pg (ref 26.0–34.0)
MCHC: 32.7 g/dL (ref 30.0–36.0)
MCV: 90.3 fL (ref 78.0–100.0)
MONO ABS: 0.3 10*3/uL (ref 0.1–1.0)
MONOS PCT: 5 %
NEUTROS ABS: 3.3 10*3/uL (ref 1.7–7.7)
NEUTROS PCT: 56 %
Platelets: 248 10*3/uL (ref 150–400)
RBC: 4.97 MIL/uL (ref 3.87–5.11)
RDW: 12.8 % (ref 11.5–15.5)
WBC: 6 10*3/uL (ref 4.0–10.5)

## 2015-02-19 NOTE — H&P (Signed)
[name] Post  Location: [facility] Surgery Patient #: [id number] DOB: 08/19/1960 Married / Language: English / Race: White Female  History of Present Illness   The patient is a 54 year old female who presents for a bariatric surgery evaluation.   Her PCP is Dr. Georges Mouse  She comes by herself.  Her initial weight is 226 and her BMI is 40.39.  She is scheduled for surgery 02/20/2015, so this is a pre op visit. Her husband was called by [employer], so he could not make it with her. She has remained off the cigarettes. She is ready for surgery  She saw Dr. Ardath Sax for psych - 09/12/2014 UGI - 08/11/2014 - normal Abdominal US - 08/11/2014 - Negative MRI of abdomen - 11/03/2014 - she said that Dr. Maudie Mercury was checking her liver - negative except fatty liver  History of weight problems: She went to an information session that I presented. She is interested in a sleeve gastrectomy. The patient has been overweight much of her adult life. She has tried multiple diets including: Weight Watchers, Atkins, Slim fast, protein shakes, and Curves. She has tried [employer] program and over-the-counter diet pills.  She has a friend who has had a sleeve resection. Per the [place], the patient is a candidate for bariatric surgery. The patient attended our initial information session and reviewed the types of bariatric surgery.  The patient is interested in the sleeve gastrectomy. I discussed with the patient the indications and risks of bariatric surgery. The potential risks of surgery include, but are not limited to, bleeding, infection, leak from the bowel, DVT and PE, open surgery, long term nutrition consequences, and death. The patient understands the importance of compliance and long term follow-up with our group after surgery.  Past Medical History: 1. HTN x 7 years 2. Quit smoking Feb 2016 She has not restarted 3. OSA - has CPAP, but has  claustrophobia and cannot wear the mask 4. Fatty liver 5. Hysterectomy 1984 6. Colonoscopy 2013 - not sure who did the procedure 7. Bask surgery - Dr. Carloyn Manner - 2011 Better, but still has pain 8. Parathyoidectomy - 2008 - Gerkin resolved kidney stones that she was having 9. She had a work up for chest pain in around 2015 at [facility]. She is not sure if a cardiologist organized this or how this was organized.   Social History: Married. Her husband is a Clinical cytogeneticist for [employer]. She works for [employer] in [place] 2 children: Robbie - 34 and [place] - 31   Other Problems (Shann Medal, MD; 02/08/2015 12:17 PM) Back Pain High blood pressure Kidney Stone Oophorectomy Left. Sleep Apnea  Past Surgical History Shann Medal, MD; 02/08/2015 12:17 PM) Hysterectomy (not due to cancer) - Partial Spinal Surgery - Lower Back  Allergies Elbert Ewings, CMA; 02/08/2015 11:51 AM) PredniSONE *CORTICOSTEROIDS* Penicillins  Medication History Elbert Ewings, CMA; 02/08/2015 11:51 AM) Norvasc (5MG  Tablet, Oral) Active. Cholecalciferol (50000UNIT Capsule, Oral) Active. Microzide (12.5MG  Capsule, Oral) Active. Effexor XR (37.5MG  Capsule ER 24HR, Oral) Active. Medications Reconciled  Vitals Elbert Ewings CMA; 02/08/2015 11:52 AM) 02/08/2015 11:52 AM Weight: 234 lb Height: 62.75in Body Surface Area: 2.17 m Body Mass Index: 41.78 kg/m Temp.: 97.46F(Temporal)  Pulse: 87 (Regular)  BP: 138/72 (Sitting, Left Arm, Standard)   Physical Exam  General: obese WF alert and generally healthy appearing. HEENT: Normal. Pupils equal.  Neck: Supple. No mass. No thyroid mass.  Lymph Nodes: No supraclavicular or cervical nodes.  Lungs: Clear to auscultation and symmetric breath sounds. Heart: RRR. No murmur or rub.  Abdomen: Soft. No mass. No tenderness. No hernia. Normal bowel sounds.   She scartches herself, so she has little sores across her  entire abdomen. She is more apple that pear.  Extremities: Good strength and ROM in upper and lower extremities.  Neurologic: Grossly intact to motor and sensory function. Psychiatric: Has normal mood and affect. Behavior is normal.  Assessment & Plan  1.  MORBID OBESITY WITH BMI OF 40.0-44.9, ADULT (E66.01)  Impression: Plan:   1) Prescription for oxycodone and zofran given to patient   2) Scheduled for surgery 02/20/2015 - Sleeve gastrectomy   Given for postop - OxyCODONE HCl 5MG /5ML, 5-10 Milliliter every six hours, as needed, 200 Milliliter, 02/08/2015, No Refill.  Given for post op -  Zofran ODT 4MG , 1 (one) Tablet Disperse every eight hours, as needed, #20, 02/08/2015, No Refill.  2.  HTN x 7 years 3. Quit smoking Feb 2016 She has not restarted 4. OSA - has CPAP, but has claustrophobia and cannot wear the mask 5. Fatty liver 6. Hysterectomy 1984 7. Colonoscopy 2013 - not sure who did the procedure 8. Bask surgery - Dr. Carloyn Manner - 2011 Better, but still has pain 9. Parathyoidectomy - 2008 - Gerkin resolved kidney stones that she was having   Alphonsa Overall, MD, [facility] Surgery Pager: [phone] Office phone:  [phone]

## 2015-02-20 ENCOUNTER — Inpatient Hospital Stay (HOSPITAL_COMMUNITY): Payer: PRIVATE HEALTH INSURANCE | Admitting: Anesthesiology

## 2015-02-20 ENCOUNTER — Encounter (HOSPITAL_COMMUNITY)
Admission: RE | Disposition: A | Discharge status: Home / Self Care | Payer: Self-pay | Source: Ambulatory Visit | Attending: Surgery

## 2015-02-20 ENCOUNTER — Inpatient Hospital Stay (HOSPITAL_COMMUNITY)
Admission: RE | Admit: 2015-02-20 | Discharge: 2015-02-22 | DRG: 621 | Disposition: A | Discharge status: Home / Self Care | Payer: PRIVATE HEALTH INSURANCE | Source: Ambulatory Visit | Attending: Surgery | Admitting: Surgery

## 2015-02-20 ENCOUNTER — Encounter (HOSPITAL_COMMUNITY): Payer: Self-pay | Admitting: *Deleted

## 2015-02-20 DIAGNOSIS — Z01812 Encounter for preprocedural laboratory examination: Secondary | ICD-10-CM | POA: Diagnosis not present

## 2015-02-20 DIAGNOSIS — Z87442 Personal history of urinary calculi: Secondary | ICD-10-CM

## 2015-02-20 DIAGNOSIS — Z6841 Body Mass Index (BMI) 40.0 and over, adult: Secondary | ICD-10-CM | POA: Diagnosis not present

## 2015-02-20 DIAGNOSIS — Z903 Acquired absence of stomach [part of]: Secondary | ICD-10-CM

## 2015-02-20 DIAGNOSIS — Z87891 Personal history of nicotine dependence: Secondary | ICD-10-CM | POA: Diagnosis not present

## 2015-02-20 DIAGNOSIS — Z79899 Other long term (current) drug therapy: Secondary | ICD-10-CM

## 2015-02-20 DIAGNOSIS — Z90710 Acquired absence of both cervix and uterus: Secondary | ICD-10-CM | POA: Diagnosis not present

## 2015-02-20 DIAGNOSIS — F40240 Claustrophobia: Secondary | ICD-10-CM | POA: Diagnosis present

## 2015-02-20 DIAGNOSIS — K760 Fatty (change of) liver, not elsewhere classified: Secondary | ICD-10-CM | POA: Diagnosis present

## 2015-02-20 DIAGNOSIS — G4733 Obstructive sleep apnea (adult) (pediatric): Secondary | ICD-10-CM | POA: Diagnosis present

## 2015-02-20 DIAGNOSIS — I10 Essential (primary) hypertension: Secondary | ICD-10-CM | POA: Diagnosis present

## 2015-02-20 HISTORY — PX: LAPAROSCOPIC GASTRIC SLEEVE RESECTION: SHX5895

## 2015-02-20 LAB — PREGNANCY, URINE: Preg Test, Ur: NEGATIVE

## 2015-02-20 LAB — HEMOGLOBIN AND HEMATOCRIT, BLOOD
HCT: 41.4 % (ref 36.0–46.0)
Hemoglobin: 14 g/dL (ref 12.0–15.0)

## 2015-02-20 SURGERY — GASTRECTOMY, SLEEVE, LAPAROSCOPIC
Anesthesia: General

## 2015-02-20 MED ORDER — LACTATED RINGERS IV SOLN: INTRAVENOUS | Status: DC

## 2015-02-20 MED ORDER — BUPIVACAINE HCL (PF) 0.25 % IJ SOLN
INTRAMUSCULAR | Status: AC
  Filled 2015-02-20: qty 30

## 2015-02-20 MED ORDER — SUCCINYLCHOLINE CHLORIDE 20 MG/ML IJ SOLN
INTRAMUSCULAR | Status: DC | PRN
  Administered 2015-02-20: 100 mg via INTRAVENOUS

## 2015-02-20 MED ORDER — TISSEEL VH 10 ML EX KIT
PACK | CUTANEOUS | Status: DC | PRN
  Administered 2015-02-20: 1

## 2015-02-20 MED ORDER — FENTANYL CITRATE (PF) 250 MCG/5ML IJ SOLN
INTRAMUSCULAR | Status: AC
  Filled 2015-02-20: qty 25

## 2015-02-20 MED ORDER — GLYCOPYRROLATE 0.2 MG/ML IJ SOLN
INTRAMUSCULAR | Status: AC
  Filled 2015-02-20: qty 3

## 2015-02-20 MED ORDER — HYDROMORPHONE HCL 2 MG/ML IJ SOLN
INTRAMUSCULAR | Status: AC
  Filled 2015-02-20: qty 1

## 2015-02-20 MED ORDER — FENTANYL CITRATE (PF) 100 MCG/2ML IJ SOLN
25.0000 ug | INTRAMUSCULAR | Status: DC | PRN
  Administered 2015-02-20: 50 ug via INTRAVENOUS

## 2015-02-20 MED ORDER — PREMIER PROTEIN SHAKE
2.0000 [oz_av] | Freq: Four times a day (QID) | ORAL | Status: DC
  Administered 2015-02-22 (×2): 2 [oz_av] via ORAL
  Filled 2015-02-20: qty 325.31

## 2015-02-20 MED ORDER — NEOSTIGMINE METHYLSULFATE 10 MG/10ML IV SOLN
INTRAVENOUS | Status: DC | PRN
Start: 2015-02-20 — End: 2015-02-20
  Administered 2015-02-20: 2 mg via INTRAVENOUS

## 2015-02-20 MED ORDER — ONDANSETRON HCL 4 MG/2ML IJ SOLN
4.0000 mg | INTRAMUSCULAR | Status: DC | PRN
  Administered 2015-02-20 – 2015-02-21 (×3): 4 mg via INTRAVENOUS
  Filled 2015-02-20 (×3): qty 2

## 2015-02-20 MED ORDER — KCL IN DEXTROSE-NACL 20-5-0.45 MEQ/L-%-% IV SOLN
INTRAVENOUS | Status: DC
  Administered 2015-02-20: 150 mL/h via INTRAVENOUS
  Administered 2015-02-21: 1000 mL via INTRAVENOUS
  Administered 2015-02-21: 10:00:00 via INTRAVENOUS
  Filled 2015-02-20 (×9): qty 1000

## 2015-02-20 MED ORDER — HEPARIN SODIUM (PORCINE) 5000 UNIT/ML IJ SOLN
5000.0000 [IU] | Freq: Three times a day (TID) | INTRAMUSCULAR | Status: DC
  Administered 2015-02-20 – 2015-02-22 (×5): 5000 [IU] via SUBCUTANEOUS
  Filled 2015-02-20 (×9): qty 1

## 2015-02-20 MED ORDER — CEFOTETAN DISODIUM-DEXTROSE 2-2.08 GM-% IV SOLR
2.0000 g | INTRAVENOUS | Status: AC
  Administered 2015-02-20: 2 g via INTRAVENOUS

## 2015-02-20 MED ORDER — NEOSTIGMINE METHYLSULFATE 10 MG/10ML IV SOLN
INTRAVENOUS | Status: AC
  Filled 2015-02-20: qty 1

## 2015-02-20 MED ORDER — MIDAZOLAM HCL 2 MG/2ML IJ SOLN
INTRAMUSCULAR | Status: AC
  Filled 2015-02-20: qty 4

## 2015-02-20 MED ORDER — HEPARIN SODIUM (PORCINE) 5000 UNIT/ML IJ SOLN
5000.0000 [IU] | INTRAMUSCULAR | Status: AC
  Administered 2015-02-20: 5000 [IU] via SUBCUTANEOUS
  Filled 2015-02-20: qty 1

## 2015-02-20 MED ORDER — ROCURONIUM BROMIDE 100 MG/10ML IV SOLN
INTRAVENOUS | Status: DC | PRN
  Administered 2015-02-20: 40 mg via INTRAVENOUS
  Administered 2015-02-20: 5 mg via INTRAVENOUS

## 2015-02-20 MED ORDER — LACTATED RINGERS IR SOLN
Status: DC | PRN
  Administered 2015-02-20: 1

## 2015-02-20 MED ORDER — ONDANSETRON HCL 4 MG/2ML IJ SOLN
INTRAMUSCULAR | Status: DC | PRN
  Administered 2015-02-20: 4 mg via INTRAVENOUS

## 2015-02-20 MED ORDER — GLYCOPYRROLATE 0.2 MG/ML IJ SOLN
INTRAMUSCULAR | Status: DC | PRN
  Administered 2015-02-20: 0.4 mg via INTRAVENOUS

## 2015-02-20 MED ORDER — ROCURONIUM BROMIDE 100 MG/10ML IV SOLN
INTRAVENOUS | Status: AC
  Filled 2015-02-20: qty 1

## 2015-02-20 MED ORDER — ACETAMINOPHEN 160 MG/5ML PO SOLN: 325.0000 mg | ORAL | Status: DC | PRN

## 2015-02-20 MED ORDER — FENTANYL CITRATE (PF) 100 MCG/2ML IJ SOLN
INTRAMUSCULAR | Status: AC
  Filled 2015-02-20: qty 2

## 2015-02-20 MED ORDER — OXYCODONE HCL 5 MG/5ML PO SOLN
5.0000 mg | ORAL | Status: DC | PRN
  Administered 2015-02-21: 5 mg via ORAL
  Filled 2015-02-20: qty 5

## 2015-02-20 MED ORDER — MEPERIDINE HCL 50 MG/ML IJ SOLN: 6.2500 mg | INTRAMUSCULAR | Status: DC | PRN

## 2015-02-20 MED ORDER — HYDROMORPHONE HCL 1 MG/ML IJ SOLN
INTRAMUSCULAR | Status: DC | PRN
  Administered 2015-02-20: 1 mg via INTRAVENOUS

## 2015-02-20 MED ORDER — ONDANSETRON HCL 4 MG/2ML IJ SOLN
INTRAMUSCULAR | Status: AC
  Filled 2015-02-20: qty 2

## 2015-02-20 MED ORDER — MORPHINE SULFATE (PF) 2 MG/ML IV SOLN
2.0000 mg | INTRAVENOUS | Status: DC | PRN
  Administered 2015-02-20 – 2015-02-21 (×5): 2 mg via INTRAVENOUS
  Filled 2015-02-20 (×5): qty 1

## 2015-02-20 MED ORDER — 0.9 % SODIUM CHLORIDE (POUR BTL) OPTIME
TOPICAL | Status: DC | PRN
  Administered 2015-02-20: 1000 mL

## 2015-02-20 MED ORDER — ACETAMINOPHEN 160 MG/5ML PO SOLN
650.0000 mg | ORAL | Status: DC | PRN
  Administered 2015-02-21: 650 mg via ORAL
  Filled 2015-02-20: qty 20.3

## 2015-02-20 MED ORDER — BUPIVACAINE HCL 0.25 % IJ SOLN
INTRAMUSCULAR | Status: DC | PRN
  Administered 2015-02-20: 30 mL

## 2015-02-20 MED ORDER — PROMETHAZINE HCL 25 MG/ML IJ SOLN
INTRAMUSCULAR | Status: AC
  Filled 2015-02-20: qty 1

## 2015-02-20 MED ORDER — PROPOFOL 10 MG/ML IV BOLUS
INTRAVENOUS | Status: DC | PRN
  Administered 2015-02-20: 200 mg via INTRAVENOUS

## 2015-02-20 MED ORDER — METOPROLOL TARTRATE 1 MG/ML IV SOLN
INTRAVENOUS | Status: DC | PRN
  Administered 2015-02-20 (×2): 1 mg via INTRAVENOUS

## 2015-02-20 MED ORDER — TISSEEL VH 10 ML EX KIT
PACK | CUTANEOUS | Status: AC
  Filled 2015-02-20: qty 1

## 2015-02-20 MED ORDER — FENTANYL CITRATE (PF) 100 MCG/2ML IJ SOLN
INTRAMUSCULAR | Status: AC
  Filled 2015-02-20: qty 4

## 2015-02-20 MED ORDER — MIDAZOLAM HCL 5 MG/5ML IJ SOLN
INTRAMUSCULAR | Status: DC | PRN
  Administered 2015-02-20 (×2): 1 mg via INTRAVENOUS

## 2015-02-20 MED ORDER — PROPOFOL 10 MG/ML IV BOLUS
INTRAVENOUS | Status: AC
  Filled 2015-02-20: qty 20

## 2015-02-20 MED ORDER — LACTATED RINGERS IV SOLN
INTRAVENOUS | Status: DC | PRN
  Administered 2015-02-20 (×2): via INTRAVENOUS

## 2015-02-20 MED ORDER — METOCLOPRAMIDE HCL 5 MG/ML IJ SOLN
INTRAMUSCULAR | Status: DC | PRN
  Administered 2015-02-20: 10 mg via INTRAVENOUS

## 2015-02-20 MED ORDER — CEFOTETAN DISODIUM-DEXTROSE 2-2.08 GM-% IV SOLR
INTRAVENOUS | Status: AC
  Filled 2015-02-20: qty 50

## 2015-02-20 MED ORDER — FENTANYL CITRATE (PF) 100 MCG/2ML IJ SOLN
INTRAMUSCULAR | Status: DC | PRN
  Administered 2015-02-20 (×6): 50 ug via INTRAVENOUS

## 2015-02-20 MED ORDER — METOCLOPRAMIDE HCL 5 MG/ML IJ SOLN
INTRAMUSCULAR | Status: AC
  Filled 2015-02-20: qty 2

## 2015-02-20 MED ORDER — PROMETHAZINE HCL 25 MG/ML IJ SOLN
6.2500 mg | INTRAMUSCULAR | Status: DC | PRN
  Administered 2015-02-20: 6.25 mg via INTRAVENOUS

## 2015-02-20 MED ORDER — METOPROLOL TARTRATE 1 MG/ML IV SOLN
INTRAVENOUS | Status: AC
  Filled 2015-02-20: qty 5

## 2015-02-20 MED ORDER — SODIUM CHLORIDE 0.9 % IJ SOLN
INTRAMUSCULAR | Status: AC
  Filled 2015-02-20: qty 10

## 2015-02-20 SURGICAL SUPPLY — 58 items
APPLICATOR COTTON TIP 6IN STRL (MISCELLANEOUS) IMPLANT
APPLIER CLIP ROT 10 11.4 M/L (STAPLE) ×3
APPLIER CLIP ROT 13.4 12 LRG (CLIP)
BLADE SURG 15 STRL LF DISP TIS (BLADE) ×1 IMPLANT
BLADE SURG 15 STRL SS (BLADE) ×2
CABLE HIGH FREQUENCY MONO STRZ (ELECTRODE) ×3 IMPLANT
CHLORAPREP W/TINT 26ML (MISCELLANEOUS) ×3 IMPLANT
CLIP APPLIE ROT 10 11.4 M/L (STAPLE) ×1 IMPLANT
CLIP APPLIE ROT 13.4 12 LRG (CLIP) IMPLANT
COVER SURGICAL LIGHT HANDLE (MISCELLANEOUS) ×3 IMPLANT
DEVICE SUTURE ENDOST 10MM (ENDOMECHANICALS) IMPLANT
DEVICE TROCAR PUNCTURE CLOSURE (ENDOMECHANICALS) IMPLANT
DISSECTOR BLUNT TIP ENDO 5MM (MISCELLANEOUS) ×3 IMPLANT
DRAPE CAMERA CLOSED 9X96 (DRAPES) ×3 IMPLANT
DRAPE UTILITY XL STRL (DRAPES) ×6 IMPLANT
ELECT REM PT RETURN 9FT ADLT (ELECTROSURGICAL) ×3
ELECTRODE REM PT RTRN 9FT ADLT (ELECTROSURGICAL) ×1 IMPLANT
GAUZE SPONGE 4X4 12PLY STRL (GAUZE/BANDAGES/DRESSINGS) IMPLANT
GLOVE SURG SIGNA 7.5 PF LTX (GLOVE) ×3 IMPLANT
GOWN STRL REUS W/TWL XL LVL3 (GOWN DISPOSABLE) ×9 IMPLANT
HOVERMATT SINGLE USE (MISCELLANEOUS) ×3 IMPLANT
KIT BASIN OR (CUSTOM PROCEDURE TRAY) ×3 IMPLANT
LIQUID BAND (GAUZE/BANDAGES/DRESSINGS) ×3 IMPLANT
NEEDLE SPNL 22GX3.5 QUINCKE BK (NEEDLE) ×3 IMPLANT
PACK UNIVERSAL I (CUSTOM PROCEDURE TRAY) ×3 IMPLANT
PEN SKIN MARKING BROAD (MISCELLANEOUS) ×3 IMPLANT
RELOAD STAPLER BLUE 60MM (STAPLE) ×2 IMPLANT
RELOAD STAPLER GOLD 60MM (STAPLE) ×2 IMPLANT
RELOAD STAPLER GREEN 60MM (STAPLE) ×2 IMPLANT
SCISSORS LAP 5X35 DISP (ENDOMECHANICALS) IMPLANT
SEALANT SURGICAL APPL DUAL CAN (MISCELLANEOUS) ×6 IMPLANT
SET IRRIG TUBING LAPAROSCOPIC (IRRIGATION / IRRIGATOR) ×3 IMPLANT
SHEARS CURVED HARMONIC AC 45CM (MISCELLANEOUS) ×3 IMPLANT
SLEEVE ADV FIXATION 5X100MM (TROCAR) ×6 IMPLANT
SLEEVE GASTRECTOMY 36FR VISIGI (MISCELLANEOUS) ×3 IMPLANT
SOLUTION ANTI FOG 6CC (MISCELLANEOUS) ×3 IMPLANT
SPONGE LAP 18X18 X RAY DECT (DISPOSABLE) ×3 IMPLANT
STAPLER ECHELON LONG 60 440 (INSTRUMENTS) ×3 IMPLANT
STAPLER RELOAD BLUE 60MM (STAPLE) ×6
STAPLER RELOAD GOLD 60MM (STAPLE) ×6
STAPLER RELOAD GREEN 60MM (STAPLE) ×6
SUT ETHILON 2 0 PS N (SUTURE) IMPLANT
SUT MNCRL AB 4-0 PS2 18 (SUTURE) ×3 IMPLANT
SYR 10ML ECCENTRIC (SYRINGE) ×3 IMPLANT
SYR 20CC LL (SYRINGE) ×3 IMPLANT
TOWEL OR 17X26 10 PK STRL BLUE (TOWEL DISPOSABLE) ×3 IMPLANT
TOWEL OR NON WOVEN STRL DISP B (DISPOSABLE) ×3 IMPLANT
TRAY FOLEY W/METER SILVER 14FR (SET/KITS/TRAYS/PACK) IMPLANT
TRAY FOLEY W/METER SILVER 16FR (SET/KITS/TRAYS/PACK) IMPLANT
TROCAR ADV FIXATION 12X100MM (TROCAR) ×3 IMPLANT
TROCAR ADV FIXATION 5X100MM (TROCAR) ×3 IMPLANT
TROCAR BLADELESS 15MM (ENDOMECHANICALS) ×3 IMPLANT
TROCAR BLADELESS OPT 5 100 (ENDOMECHANICALS) ×3 IMPLANT
TROCAR XCEL 12X100 BLDLESS (ENDOMECHANICALS) IMPLANT
TUBING CONNECTING 10 (TUBING) ×2 IMPLANT
TUBING CONNECTING 10' (TUBING) ×1
TUBING ENDO SMARTCAP PENTAX (MISCELLANEOUS) ×3 IMPLANT
TUBING FILTER THERMOFLATOR (ELECTROSURGICAL) ×3 IMPLANT

## 2015-02-20 NOTE — Op Note (Signed)
Procedure: Upper GI endoscopy  Description of procedure: Upper GI endoscopy is performed at the completion of laparoscopic sleeve gastrectomy by Dr.  Lucia Gaskins  The video endoscope was introduced into the upper esophagus and then passed to the EG junction at about 40 cm. The esophagus appeared normal. The gastric sleeve was entered. The sleeve was tensely distended with air while the outlet was obstructed under saline irrigation by the operating surgeon. There was no evidence of leak. The staple line was intact and without bleeding. The scope was advanced to the antrum and pylorus visualized. There was no stricture or twisting or mucosal abnormality, and particularly no narrowing noted at the incisura.  The pouch was then desufflated and the scope withdrawn.  Edward Jolly MD, FACS  02/20/2015, 10:14 AM

## 2015-02-20 NOTE — Progress Notes (Signed)
Patient does not have to stay in PACU for 2 hours- per Dr. Marcell Barlow.

## 2015-02-20 NOTE — Op Note (Signed)
PATIENT:   [name] DOB:   Jun 14, 1960 MRN:   [id number]  DATE OF PROCEDURE: 02/20/2015                   FACILITY:  [facility]  OPERATIVE REPORT  PREOPERATIVE DIAGNOSIS:  Morbid obesity.  POSTOPERATIVE DIAGNOSIS:  Morbid obesity (weight 226, BMI of 40.39).  PROCEDURE:  Laparoscopic Sleeve gastrectomy (intraoperative upper endoscopy by Dr. Excell Nichols)  SURGEON:  [name]. Jennifer Gaskins, Nichols  FIRST ASSISTANT:  B. Hoxworth, Nichols  ANESTHESIA:  General endotracheal.  Anesthesiologist: Jennifer Hageman, MD CRNA: Jennifer Boston, CRNA  General  ESTIMATED BLOOD LOSS:  Minimal.  LOCAL ANESTHESIA:  30 cc of 1/8% Marcaine  COMPLICATIONS:  None.  INDICATION FOR SURGERY:  [name] is a 54 y.o. white  female who sees [name] as her primary care doctor.  She has completed our preoperative bariatric program and now comes for a laparoscopic Sleeve gastrectomy.  The indications, potential complications of surgery were explained to the patient.  Potential complications of the surgery include, but are not limited to, bleeding, infection, DVT, open surgery, and long-term nutritional consequences.  OPERATIVE NOTE:  The patient taken to room #2 at [facility] where Ms. [name] underwent a general endotracheal anesthetic, supervised by Anesthesiologist: Jennifer Hageman, MD CRNA: Jennifer Boston, CRNA.  The patient was given 2 g of cefotetan at the beginning of the procedure.  A time-out was held and surgical checklist run.  I accessed her abdominal cavity through the left upper quadrant with a 5 mm Optiview. I did an abdominal exploration.   Her omentum and bowel were unremarkable. The right and left lobes of the liver unremarkable. Gallbladder was normal. Her stomach was unremarkable.   I placed a total of 6 trocars. I placed a 5 mm left lateral trocar, a 5 mm left paramedian trocar (for the scope), a 12 mm right paramedian torcar, a 5 mm right subcostal trocar that I converted to a 15 mm to extract the stomach and  5 mm subxiphoid trocar for the liver retractor.  I started out taking down the greater curvature attachments of the stomach. I measured approximately 6 cm proximal from the pylorus and mobilized the greater curvature of the stomach with the Harmonic Scalpel. I took this dissection cranially around the greater curvature of her stomach to the angle of His and the left crus.   After I had mobilized the greater curvature of the stomach, I then passed the 36 French ViSiGi bougie which was used to suck the stomach up against the lesser curvature and placed into the antrum. During the staple firing,  I tried to give the [place] a cuff at least about 1 cm. I tried to avoid narrowing the incisura. I used a total of 7 staple firings.  From antrum to the angle of His, I used 2 green, 3 gold and 2 blue [place] 60 mm Ethicon staplers. I did not use staple line reinforcement.   At each firing of the EndoGIA stapler, I inspected the stomach, anterior wall of the stomach, and underneath to make sure there was no compromise or impingement on to the ViSiGi bougie.   The staple line seemed linear without any corkscrewing of the stomach. Hemostasis was good. I did not use any reinforcement. She did have at least 2 areas of bleeding which I used clips to clip on the new greater curvature of the stomach.  Because I thought we had a good staple line, I then  had the ViSiGi was converted to insufflate the pouch. A new stomach pouch was placed under water. There was no bubbling or leak noted.   At this point, Dr. Excell Nichols broke scrub and passed an upper endoscope down through the esophagus into the stomach pouch. The stomach was tubular. There was no narrowing of the stomach pouch or angulation. We were easily able to pass the endoscope into the antrum and again put air pressure on the staple line. I irrigated the upper abdomen with saline. There was no bubbling or evidence of air leak. The mucosa looked viable. Dr. Excell Nichols  decompressed the stomach with the endoscopy.   I converted to right subcostal trocar to a 15 trocar and extracted the stomach remnant through this intact and sent this to Pathology. I then placed 10 cc of Tisseel along the new greater curvature staple line and covered the entire staple line with the Tisseel.  I aspirated out the saline that I had irrigated because I thought the staple line looked viable and complete. There was no evidence of leak. I did not leave a drain in place.   Then, I closed the trocar sites. I did not place a suture in the port site in the right upper quadrant. The other port sites seemed smaller not requiring sutures. I closed the skin at each site with a 4-0 Monocryl, painted each wound with LiquiBand.   The patient was transported to recovery room in good condition. Sponge and needle count were correct at the end of the case.    Alphonsa Overall, Nichols, [facility] Surgery Pager: [phone] Office phone:  [phone]

## 2015-02-20 NOTE — Anesthesia Procedure Notes (Signed)
Procedure Name: Intubation Date/Time: 02/20/2015 7:37 AM Performed by: Deliah Boston Pre-anesthesia Checklist: Patient identified, Emergency Drugs available, Suction available and Patient being monitored Patient Re-evaluated:Patient Re-evaluated prior to inductionOxygen Delivery Method: Circle System Utilized Preoxygenation: Pre-oxygenation with 100% oxygen Intubation Type: IV induction Ventilation: Mask ventilation without difficulty Laryngoscope Size: Miller and 2 Grade View: Grade I Tube type: Oral Tube size: 7.5 mm Number of attempts: 1 Airway Equipment and Method: Oral airway Placement Confirmation: ETT inserted through vocal cords under direct vision,  positive ETCO2 and breath sounds checked- equal and bilateral Secured at: 18 cm Tube secured with: Tape Dental Injury: Teeth and Oropharynx as per pre-operative assessment

## 2015-02-20 NOTE — Anesthesia Preprocedure Evaluation (Addendum)
Anesthesia Evaluation  Patient identified by MRN, date of birth, ID band Patient awake    Reviewed: Allergy & Precautions, NPO status , Patient's Chart, lab work & pertinent test results  Airway Mallampati: III  TM Distance: >3 FB Neck ROM: Full    Dental no notable dental hx.    Pulmonary sleep apnea , former smoker,    Pulmonary exam normal breath sounds clear to auscultation       Cardiovascular hypertension, Pt. on medications Normal cardiovascular exam Rhythm:Regular Rate:Normal     Neuro/Psych negative neurological ROS  negative psych ROS   GI/Hepatic negative GI ROS, Neg liver ROS,   Endo/Other  Morbid obesity  Renal/GU negative Renal ROS  negative genitourinary   Musculoskeletal negative musculoskeletal ROS (+)   Abdominal   Peds negative pediatric ROS (+)  Hematology negative hematology ROS (+)   Anesthesia Other Findings   Reproductive/Obstetrics negative OB ROS                            Anesthesia Physical Anesthesia Plan  ASA: III  Anesthesia Plan: General   Post-op Pain Management:    Induction: Intravenous  Airway Management Planned: Oral ETT  Additional Equipment:   Intra-op Plan:   Post-operative Plan: Extubation in OR  Informed Consent: I have reviewed the patients History and Physical, chart, labs and discussed the procedure including the risks, benefits and alternatives for the proposed anesthesia with the patient or authorized representative who has indicated his/her understanding and acceptance.   Dental advisory given  Plan Discussed with: CRNA  Anesthesia Plan Comments:         Anesthesia Quick Evaluation

## 2015-02-20 NOTE — Anesthesia Postprocedure Evaluation (Addendum)
  Anesthesia Post-op Note  Patient: [name]  Procedure(s) Performed: Procedure(s) (LRB): LAPAROSCOPIC GASTRIC SLEEVE RESECTION (N/A)  Patient Location: PACU  Anesthesia Type: ga  Level of Consciousness: awake and alert   Airway and Oxygen Therapy: Patient Spontanous Breathing  Post-op Pain: mild  Post-op Assessment: Post-op Vital signs reviewed, Patient's Cardiovascular Status Stable, Respiratory Function Stable, Patent Airway and No signs of Nausea or vomiting  Last Vitals:  Filed Vitals:   02/20/15 1230  BP: 139/82  Pulse: 56  Temp: 36.5 C  Resp: 16    Post-op Vital Signs: stable   Complications: No apparent anesthesia complications

## 2015-02-20 NOTE — Interval H&P Note (Signed)
History and Physical Interval Note:  02/20/2015 7:09 AM  [name]  has presented today for surgery, with the diagnosis of Morbid Obesity  The various methods of treatment have been discussed with the patient and family.   Her husband is here with her today.  After consideration of risks, benefits and other options for treatment, the patient has consented to  Procedure(s): LAPAROSCOPIC GASTRIC SLEEVE RESECTION (N/A) as a surgical intervention .  The patient's history has been reviewed, patient examined, no change in status, stable for surgery.  I have reviewed the patient's chart and labs.  Questions were answered to the patient's satisfaction.     Kiev Labrosse H

## 2015-02-20 NOTE — Transfer of Care (Signed)
Immediate Anesthesia Transfer of Care Note  Patient: [name]  Procedure(s) Performed: Procedure(s): LAPAROSCOPIC GASTRIC SLEEVE RESECTION (N/A)  Patient Location: PACU  Anesthesia Type:General  Level of Consciousness: Patient easily awoken, sedated, comfortable, cooperative, following commands, responds to stimulation.   Airway & Oxygen Therapy: Patient spontaneously breathing, ventilating well, oxygen via simple oxygen mask.  Post-op Assessment: Report given to PACU RN, vital signs reviewed and stable, moving all extremities.   Post vital signs: Reviewed and stable.  Complications: No apparent anesthesia complications

## 2015-02-21 ENCOUNTER — Inpatient Hospital Stay (HOSPITAL_COMMUNITY): Payer: PRIVATE HEALTH INSURANCE

## 2015-02-21 LAB — CBC WITH DIFFERENTIAL/PLATELET
BASOS ABS: 0 10*3/uL (ref 0.0–0.1)
Basophils Relative: 0 %
EOS PCT: 1 %
Eosinophils Absolute: 0.1 10*3/uL (ref 0.0–0.7)
HCT: 41 % (ref 36.0–46.0)
Hemoglobin: 13.8 g/dL (ref 12.0–15.0)
LYMPHS PCT: 16 %
Lymphs Abs: 1.5 10*3/uL (ref 0.7–4.0)
MCH: 30.1 pg (ref 26.0–34.0)
MCHC: 33.7 g/dL (ref 30.0–36.0)
MCV: 89.5 fL (ref 78.0–100.0)
MONO ABS: 0.6 10*3/uL (ref 0.1–1.0)
Monocytes Relative: 6 %
Neutro Abs: 7.2 10*3/uL (ref 1.7–7.7)
Neutrophils Relative %: 77 %
PLATELETS: 233 10*3/uL (ref 150–400)
RBC: 4.58 MIL/uL (ref 3.87–5.11)
RDW: 12.8 % (ref 11.5–15.5)
WBC: 9.4 10*3/uL (ref 4.0–10.5)

## 2015-02-21 LAB — HEMOGLOBIN AND HEMATOCRIT, BLOOD
HCT: 41.3 % (ref 36.0–46.0)
Hemoglobin: 13.6 g/dL (ref 12.0–15.0)

## 2015-02-21 MED ORDER — IOHEXOL 300 MG/ML  SOLN
50.0000 mL | Freq: Once | INTRAMUSCULAR | Status: DC | PRN
  Administered 2015-02-21: 40 mL via ORAL
  Filled 2015-02-21: qty 50

## 2015-02-21 NOTE — Progress Notes (Signed)
General Surgery Note  LOS: 1 day  POD -  1 Day Post-Op  Assessment/Plan: 1.  LAPAROSCOPIC GASTRIC SLEEVE RESECTION - 02/20/2015 - [name]  MORBID OBESITY WITH BMI OF 40.0-44.9, ADULT (E66.01)  For swallow today  2. HTN x 7 years 3. Quit smoking Feb 2016  She has not restarted 4. OSA - has CPAP, but has claustrophobia and cannot wear the mask 5. Fatty liver 6. Hysterectomy 1984 7. Colonoscopy 2013 - not sure who did the procedure 8. Bask surgery - Dr. Carloyn Manner - 2011  Better, but still has pain 9. Parathyoidectomy - 2008 - Gerkin resolved kidney stones that she was having 10. DVT prophylaxis - SQ Heparin   Active Problems:   Morbid obesity ([facility])  Subjective:  Doing well.  She had nausea last PM, but that is better today.  She is in the room by herself. Objective:   Filed Vitals:   02/21/15 0505  BP: 145/79  Pulse: 70  Temp: 99.4 F (37.4 C)  Resp: 20     Intake/Output from previous day:  10/04 0701 - 10/05 0700 In: 3837.5 [I.V.:3837.5] Out: 2750 [Urine:2750]  Intake/Output this shift:      Physical Exam:   General: Obese WF who is alert and oriented.    HEENT: Normal. Pupils equal. .   Lungs: Clear.   Abdomen: Soft   Wound: Clean \  Lab Results:    Recent Labs  02/19/15 1330 02/20/15 1755 02/21/15 0547  WBC 6.0  --  9.4  HGB 14.7 14.0 13.8  HCT 44.9 41.4 41.0  PLT 248  --  233    BMET   Recent Labs  02/19/15 1330  NA 141  K 4.1  CL 107  CO2 27  GLUCOSE 91  BUN 18  CREATININE 0.77  CALCIUM 9.2    PT/INR  No results for input(s): LABPROT, INR in the last 72 hours.  ABG  No results for input(s): PHART, HCO3 in the last 72 hours.  Invalid input(s): PCO2, PO2   Studies/Results:  No results found.   Anti-infectives:   Anti-infectives    Start     Dose/Rate Route Frequency Ordered Stop   02/20/15 0558  cefoTEtan in Dextrose 5% (CEFOTAN) IVPB 2 g     2 g Intravenous On call to O.R. 02/20/15 0558  02/20/15 0741      Alphonsa Overall, MD, FACS Pager: [facility]: [phone] 02/21/2015

## 2015-02-21 NOTE — Progress Notes (Signed)
Patient alert and oriented, Post op day 1.  Provided support and encouragement.  Encouraged pulmonary toilet, ambulation and small sips of liquids.  All questions answered.  Will continue to monitor. 

## 2015-02-21 NOTE — Care Management Note (Signed)
Case Management Note  Patient Details  Name: [name] MRN: [id number] Date of Birth: 1961/01/05  Subjective/Objective:      Laparoscopic Sleeve gastrectomy               Action/Plan: Discharge planning  Expected Discharge Date:                  Expected Discharge Plan:  Home/Self Care  In-House Referral:  NA  Discharge planning Services  CM Consult  Post Acute Care Choice:  NA Choice offered to:  NA  DME Arranged:  N/A DME Agency:  NA  HH Arranged:  NA HH Agency:  NA  Status of Service:  Completed, signed off  Medicare Important Message Given:    Date Medicare IM Given:    Medicare IM give by:    Date Additional Medicare IM Given:    Additional Medicare Important Message give by:     If discussed at [place] of Stay Meetings, dates discussed:    Additional Comments:  Guadalupe Maple, RN 02/21/2015, 1:25 PM

## 2015-02-22 LAB — CBC WITH DIFFERENTIAL/PLATELET
Basophils Absolute: 0 10*3/uL (ref 0.0–0.1)
Basophils Relative: 0 %
EOS PCT: 3 %
Eosinophils Absolute: 0.2 10*3/uL (ref 0.0–0.7)
HEMATOCRIT: 39.7 % (ref 36.0–46.0)
HEMOGLOBIN: 13 g/dL (ref 12.0–15.0)
LYMPHS ABS: 2.1 10*3/uL (ref 0.7–4.0)
LYMPHS PCT: 30 %
MCH: 29.9 pg (ref 26.0–34.0)
MCHC: 32.7 g/dL (ref 30.0–36.0)
MCV: 91.3 fL (ref 78.0–100.0)
Monocytes Absolute: 0.6 10*3/uL (ref 0.1–1.0)
Monocytes Relative: 8 %
NEUTROS PCT: 59 %
Neutro Abs: 4 10*3/uL (ref 1.7–7.7)
Platelets: 219 10*3/uL (ref 150–400)
RBC: 4.35 MIL/uL (ref 3.87–5.11)
RDW: 12.8 % (ref 11.5–15.5)
WBC: 6.8 10*3/uL (ref 4.0–10.5)

## 2015-02-22 NOTE — Progress Notes (Signed)
Patient alert and oriented, pain is controlled. Patient is tolerating fluids, advanced to protein shake today, patient is tolerating well.  Reviewed Gastric sleeve discharge instructions with patient and patient is able to articulate understanding.  Provided information on BELT program, Support Group and [facility] outpatient pharmacy. All questions answered, will continue to monitor.  

## 2015-02-22 NOTE — Plan of Care (Signed)
Problem: Food- and Nutrition-Related Knowledge Deficit (NB-1.1) Goal: Nutrition education Formal process to instruct or train a patient/client in a skill or to impart knowledge to help patients/clients voluntarily manage or modify food choices and eating behavior to maintain or improve health. Outcome: Completed/Met Date Met:  02/22/15 Nutrition Education Note  Received consult for diet education per DROP protocol.   Discussed 2 week post op diet with pt. Emphasized that liquids must be non carbonated, non caffeinated, and sugar free. Fluid goals discussed. Pt to follow up with outpatient bariatric RD for further diet progression after 2 weeks. Multivitamins and minerals also reviewed. Teach back method used, pt expressed understanding, expect good compliance.   Diet: First 2 Weeks  You will see the nutritionist about two (2) weeks after your surgery. The nutritionist will increase the types of foods you can eat if you are handling liquids well:  If you have severe vomiting or nausea and cannot handle clear liquids lasting longer than 1 day, call your surgeon  Protein Shake  Drink at least 2 ounces of shake 5-6 times per day  Each serving of protein shakes (usually 8 - 12 ounces) should have a minimum of:  15 grams of protein  And no more than 5 grams of carbohydrate  Goal for protein each day:  Men = 80 grams per day  Women = 60 grams per day  Protein powder may be added to fluids such as non-fat milk or Lactaid milk or Soy milk (limit to 35 grams added protein powder per serving)   Hydration  Slowly increase the amount of water and other clear liquids as tolerated (See Acceptable Fluids)  Slowly increase the amount of protein shake as tolerated  Sip fluids slowly and throughout the day  May use sugar substitutes in small amounts (no more than 6 - 8 packets per day; i.e. Splenda)   Fluid Goal  The first goal is to drink at least 8 ounces of protein shake/drink per day (or as directed  by the nutritionist); some examples of protein shakes are [employer], [employer], EAS Edge HP, and Unjury. See handout from pre-op Bariatric Education Class:  Slowly increase the amount of protein shake you drink as tolerated  You may find it easier to slowly sip shakes throughout the day  It is important to get your proteins in first  Your fluid goal is to drink 64 - 100 ounces of fluid daily  It may take a few weeks to build up to this  32 oz (or more) should be clear liquids  And  32 oz (or more) should be full liquids (see below for examples)  Liquids should not contain sugar, caffeine, or carbonation   Clear Liquids:  Water or Sugar-free flavored water (i.e. Fruit H2O, Propel)  Decaffeinated coffee or tea (sugar-free)  Crystal Lite, Wyler's Lite, Minute Maid Lite  Sugar-free Jell-O  Bouillon or broth  Sugar-free Popsicle: *Less than 20 calories each; Limit 1 per day   Full Liquids:  Protein Shakes/Drinks + 2 choices per day of other full liquids  Full liquids must be:  No More Than 12 grams of Carbs per serving  No More Than 3 grams of Fat per serving  Strained low-fat cream soup  Non-Fat milk  Fat-free Lactaid Milk  Sugar-free yogurt (Dannon Lite & Fit, Greek yogurt)     Clayton Bibles, MS, RD, LDN Pager: [phone] After Hours Pager: [phone]

## 2015-02-22 NOTE — Progress Notes (Signed)
Discharge instructions given to patient questions answered 

## 2015-02-22 NOTE — Discharge Summary (Signed)
Physician Discharge Summary  Patient ID:  [name]  MRN: [id number]  DOB/AGE: [birth year] 54 y.o.  Admit date: 02/20/2015 Discharge date: 02/22/2015  Discharge Diagnoses:  1.   MORBID OBESITY WITH BMI OF 40.0-44.9, ADULT (E66.01)  2. HTN x 7 years 3. Quit smoking Feb 2016 She has not restarted 4. OSA - has CPAP, but has claustrophobia and cannot wear the mask 5. Fatty liver 6. Hysterectomy 1984 7. Colonoscopy 2013 - not sure who did the procedure 8. Bask surgery - Dr. Carloyn Manner - 2011 Better, but still has pain 9. Parathyoidectomy - 2008 - Gerkin resolved kidney stones that she was having  Active Problems:   Morbid obesity ([place])  Operation: Procedure(s): LAPAROSCOPIC GASTRIC SLEEVE RESECTION, upper endoscopy on 02/20/2015  Discharged Condition: good  Hospital Course: [name] is an 54 y.o. female whose primary care physician is Jani Gravel, MD and who was admitted 02/20/2015 with a chief complaint of morbid obesity.   She was brought to the operating room on 02/20/2015 and underwent  LAPAROSCOPIC GASTRIC SLEEVE RESECTION.  She had an UGI swallow the first post op day that looked good. She is now 2 days post op.  She has started her protein drinks and is ready to go home today.  The discharge instructions were reviewed with the patient.  Consults: None  Significant Diagnostic Studies: Results for orders placed or performed during the hospital encounter of 02/20/15  Pregnancy, urine STAT morning of surgery  Result Value Ref Range   Preg Test, Ur NEGATIVE NEGATIVE  Hemoglobin and hematocrit, blood  Result Value Ref Range   Hemoglobin 14.0 12.0 - 15.0 g/dL   HCT 41.4 36.0 - 46.0 %  CBC WITH DIFFERENTIAL  Result Value Ref Range   WBC 9.4 4.0 - 10.5 K/uL   RBC 4.58 3.87 - 5.11 MIL/uL   Hemoglobin 13.8 12.0 - 15.0 g/dL   HCT 41.0 36.0 - 46.0 %   MCV 89.5 78.0 - 100.0 fL   MCH 30.1 26.0 - 34.0 pg   MCHC 33.7 30.0 - 36.0  g/dL   RDW 12.8 11.5 - 15.5 %   Platelets 233 150 - 400 K/uL   Neutrophils Relative % 77 %   Neutro Abs 7.2 1.7 - 7.7 K/uL   Lymphocytes Relative 16 %   Lymphs Abs 1.5 0.7 - 4.0 K/uL   Monocytes Relative 6 %   Monocytes Absolute 0.6 0.1 - 1.0 K/uL   Eosinophils Relative 1 %   Eosinophils Absolute 0.1 0.0 - 0.7 K/uL   Basophils Relative 0 %   Basophils Absolute 0.0 0.0 - 0.1 K/uL  Hemoglobin and hematocrit, blood  Result Value Ref Range   Hemoglobin 13.6 12.0 - 15.0 g/dL   HCT 41.3 36.0 - 46.0 %  CBC with Differential  Result Value Ref Range   WBC 6.8 4.0 - 10.5 K/uL   RBC 4.35 3.87 - 5.11 MIL/uL   Hemoglobin 13.0 12.0 - 15.0 g/dL   HCT 39.7 36.0 - 46.0 %   MCV 91.3 78.0 - 100.0 fL   MCH 29.9 26.0 - 34.0 pg   MCHC 32.7 30.0 - 36.0 g/dL   RDW 12.8 11.5 - 15.5 %   Platelets 219 150 - 400 K/uL   Neutrophils Relative % 59 %   Neutro Abs 4.0 1.7 - 7.7 K/uL   Lymphocytes Relative 30 %   Lymphs Abs 2.1 0.7 - 4.0 K/uL   Monocytes Relative 8 %   Monocytes Absolute 0.6 0.1 - 1.0  K/uL   Eosinophils Relative 3 %   Eosinophils Absolute 0.2 0.0 - 0.7 K/uL   Basophils Relative 0 %   Basophils Absolute 0.0 0.0 - 0.1 K/uL    Dg Ugi W/water Sol Cm  02/21/2015   CLINICAL DATA:  Initial encounter for status post gastric sleeve procedure.  EXAM: WATER SOLUBLE UPPER GI SERIES  TECHNIQUE: Single-column upper GI series was performed using water soluble contrast.  CONTRAST:  35mL OMNIPAQUE IOHEXOL 300 MG/ML  SOLN  COMPARISON:  08/11/2014.  FLUOROSCOPY TIME:  If the device does not provide the exposure index:  Fluoroscopy Time (in minutes and seconds): 1 minutes and 27 seconds.  Number of Acquired Images:  9  FINDINGS: Pre-procedure KUB shows a normal bowel gas pattern.  Patient was given approximately 20 cc water-soluble contrast by mouth. Contrast accumulates in the proximal stomach with only slight delay before passing into the body and antrum of the stomach. An additional 20 cc water-soluble  contrast was then given by mouth. No evidence for contrast extravasation to suggest staple line leak on frontal and bilateral shallow oblique views. Contrast material passes into a nondilated duodenum although duodenum and proximal jejunal peristalsis is sluggish.  IMPRESSION: Slight stasis of contrast in proximal stomach before passing readily through the gastric body and antrum into a nondilated duodenum. No evidence for contrast extravasation to suggest gastric leak.   Electronically Signed   By: Misty Stanley M.D.   On: 02/21/2015 10:28    Discharge Exam:  Filed Vitals:   02/22/15 0539  BP: 124/65  Pulse: 51  Temp: 98.5 F (36.9 C)  Resp: 18    General: Obese WF who is alert and generally healthy appearing.  Lungs: Clear to auscultation and symmetric breath sounds. Heart:  RRR. No murmur or rub. Abdomen: Soft. No mass.  No hernia. Normal bowel sounds.  Her incisions look good.  Discharge Medications:     Medication List    STOP taking these medications        naproxen 500 MG tablet  Commonly known as:  NAPROSYN      TAKE these medications        amLODipine 10 MG tablet  Commonly known as:  NORVASC  Take 1 tablet by mouth every morning.  Notes to Patient:  Monitor Blood Pressure Daily and keep a log for primary care physician.  You may need to make changes to your medications with rapid weight loss.        aspirin EC 81 MG tablet  Take 1 tablet (81 mg total) by mouth daily.  Notes to Patient:  Avoid NSAIDs for 6-8 weeks after surgery      hydrochlorothiazide 12.5 MG capsule  Commonly known as:  MICROZIDE  Take 12.5 mg by mouth every morning.  Notes to Patient:  Monitor Blood Pressure Daily and keep a log for primary care physician.  Monitor for symptoms of dehydration.  You may need to make changes to your medications with rapid weight loss.        ondansetron 4 MG disintegrating tablet  Commonly known as:  ZOFRAN ODT  Take 1 tablet (4 mg total) by mouth every 8  (eight) hours as needed for nausea or vomiting.     oxyCODONE-acetaminophen 5-325 MG tablet  Commonly known as:  PERCOCET  Take 1 tablet by mouth every 6 (six) hours as needed for severe pain.     tamsulosin 0.4 MG Caps capsule  Commonly known as:  FLOMAX  Take 1 capsule (  0.4 mg total) by mouth daily after supper. Take until stone has passed     venlafaxine 37.5 MG tablet  Commonly known as:  EFFEXOR  Take 37.5 mg by mouth every evening.     Vitamin D-3 5000 UNITS Tabs  Take 1 tablet by mouth every morning.        Disposition: 01-Home or Self Care      Discharge Instructions    Ambulate hourly while awake    Complete by:  As directed      Call MD for:  difficulty breathing, headache or visual disturbances    Complete by:  As directed      Call MD for:  persistant dizziness or light-headedness    Complete by:  As directed      Call MD for:  persistant nausea and vomiting    Complete by:  As directed      Call MD for:  redness, tenderness, or signs of infection (pain, swelling, redness, odor or green/yellow discharge around incision site)    Complete by:  As directed      Call MD for:  severe uncontrolled pain    Complete by:  As directed      Call MD for:  temperature >101 F    Complete by:  As directed      Diet bariatric full liquid    Complete by:  As directed      Incentive spirometry    Complete by:  As directed   Perform hourly while awake           Follow-up Information    Follow up with [facility], MD. Go on 03/02/2015.   Specialty:  General Surgery   Why:  For Post-Op Check at 1:45   Contact information:   [address] [place] [address] [phone]        Signed: Alphonsa Overall, M.D., [facility] Office:  [phone]  02/22/2015, 9:37 AM

## 2015-02-22 NOTE — Discharge Instructions (Signed)
° ° ° °GASTRIC BYPASS/SLEEVE ° Home Care Instructions ° ° These instructions are to help you care for yourself when you go home. ° °Call: If you have any problems. °• Call [phone] and ask for the surgeon on call °• If you need immediate assistance come to the ER at [facility]. Tell the ER staff you are a new post-op gastric bypass or gastric sleeve patient  °Signs and symptoms to report: • Severe  vomiting or nausea °o If you cannot handle clear liquids for longer than 1 day, call your surgeon °• Abdominal pain which does not get better after taking your pain medication °• Fever greater than 100.4°  F and chills °• Heart rate over 100 beats a minute °• Trouble breathing °• Chest pain °• Redness,  swelling, drainage, or foul odor at incision (surgical) sites °• If your incisions open or pull apart °• Swelling or pain in calf (lower leg) °• Diarrhea (Loose bowel movements that happen often), frequent watery, uncontrolled bowel movements °• Constipation, (no bowel movements for 3 days) if this happens: °o Take Milk of Magnesia, 2 tablespoons by mouth, 3 times a day for 2 days if needed °o Stop taking Milk of Magnesia once you have had a bowel movement °o Call your doctor if constipation continues °Or °o Take Miralax  (instead of Milk of Magnesia) following the label instructions °o Stop taking Miralax once you have had a bowel movement °o Call your doctor if constipation continues °• Anything you think is “abnormal for you” °  °Normal side effects after surgery: • Unable to sleep at night or unable to concentrate °• Irritability °• Being tearful (crying) or depressed ° °These are common complaints, possibly related to your anesthesia, stress of surgery, and change in lifestyle, that usually go away a few weeks after surgery. If these feelings continue, call your medical doctor.  °Wound Care: You may have surgical glue, steri-strips, or staples over your incisions after surgery °• Surgical glue: Looks like clear  film over your incisions and will wear off a little at a time °• Steri-strips: Adhesive strips of tape over your incisions. You may notice a yellowish color on skin under the steri-strips. This is used to make the steri-strips stick better. Do not pull the steri-strips off - let them fall off °• Staples: Staples may be removed before you leave the hospital °o If you go home with staples, call [facility] for an appointment with your surgeon’s nurse to have staples removed 10 days after surgery, [phone] °• Showering: You may shower two (2) days after your surgery unless your surgeon tells you differently °o Wash gently around incisions with warm soapy water, rinse well, and gently pat dry °o If you have a drain (tube from your incision), you may need someone to hold this while you shower °o No tub baths until staples are removed and incisions are healed °  °Medications: • Medications should be liquid or crushed if larger than the size of a dime °• Extended release pills (medication that releases a little bit at a time through the  day) should not be crushed °• Depending on the size and number of medications you take, you may need to space (take a few throughout the day)/change the time you take your medications so that you do not over-fill your pouch (smaller stomach) °• Make sure you follow-up with you primary care physician to make medication changes needed during rapid weight loss and life -style changes °•   If you have diabetes, follow up with your doctor that orders your diabetes medication(s) within one week after surgery and check your blood sugar regularly ° °• Do not drive while taking narcotics (pain medications) ° °• Do not take acetaminophen (Tylenol) and Roxicet or Lortab Elixir at the same time since these pain medications contain acetaminophen °  °Diet:  °First 2 Weeks You will see the nutritionist about two (2) weeks after your surgery. The nutritionist will increase the types of  foods you can eat if you are handling liquids well: °• If you have severe vomiting or nausea and cannot handle clear liquids lasting longer than 1 day call your surgeon °Protein Shake °• Drink at least 2 ounces of shake 5-6 times per day °• Each serving of protein shakes (usually 8-12 ounces) should have a minimum of: °o 15 grams of protein °o And no more than 5 grams of carbohydrate °• Goal for protein each day: °o Men = 80 grams per day °o Women = 60 grams per day °  ° • Protein powder may be added to fluids such as non-fat milk or Lactaid milk or Soy milk (limit to 35 grams added protein powder per serving) ° °Hydration °• Slowly increase the amount of water and other clear liquids as tolerated (See Acceptable Fluids) °• Slowly increase the amount of protein shake as tolerated °• Sip fluids slowly and throughout the day °• May use sugar substitutes in small amounts (no more than 6-8 packets per day; i.e. Splenda) ° °Fluid Goal °• The first goal is to drink at least 8 ounces of protein shake/drink per day (or as directed by the nutritionist); some examples of protein shakes are Syntrax Nectar, Adkins Advantage, EAS Edge HP, and Unjury. - See handout from pre-op Bariatric Education Class: °o Slowly increase the amount of protein shake you drink as tolerated °o You may find it easier to slowly sip shakes throughout the day °o It is important to get your proteins in first °• Your fluid goal is to drink 64-100 ounces of fluid daily °o It may take a few weeks to build up to this  °• 32 oz. (or more) should be clear liquids °And °• 32 oz. (or more) should be full liquids (see below for examples) °• Liquids should not contain sugar, caffeine, or carbonation ° °Clear Liquids: °• Water of Sugar-free flavored water (i.e. Fruit H²O, Propel) °• Decaffeinated coffee or tea (sugar-free) °• Crystal lite, Wyler’s Lite, Minute Maid Lite °• Sugar-free Jell-O °• Bouillon or broth °• Sugar-free Popsicle:    - Less than 20 calories  each; Limit 1 per day ° °Full Liquids: °                  Protein Shakes/Drinks + 2 choices per day of other full liquids °• Full liquids must be: °o No More Than 12 grams of Carbs per serving °o No More Than 3 grams of Fat per serving °• Strained low-fat cream soup °• Non-Fat milk °• Fat-free Lactaid Milk °• Sugar-free yogurt (Dannon Lite & Fit, Greek yogurt) ° °  °Vitamins and Minerals • Start 1 day after surgery unless otherwise directed by your surgeon °• 2 Chewable Multivitamin / Multimineral Supplement with iron (i.e. Centrum for Adults) °• Vitamin B-12, 350-500 micrograms sub-lingual (place tablet under the tongue) each day °• Chewable Calcium Citrate with Vitamin D-3 °(Example: 3 Chewable Calcium  Plus 600 with Vitamin D-3) °o Take 500 mg three (3) times a day for   a total of 1500 mg each day °o Do not take all 3 doses of calcium at one time as it may cause constipation, and you can only absorb 500 mg at a time °o Do not mix multivitamins containing iron with calcium supplements;  take 2 hours apart °o Do not substitute Tums (calcium carbonate) for your calcium °• Menstruating women and those at risk for anemia ( a blood disease that causes weakness) may need extra iron °o Talk to your doctor to see if you need more iron °• If you need extra iron: Total daily Iron recommendation (including Vitamins) is 50 to 100 mg Iron/day °• Do not stop taking or change any vitamins or minerals until you talk to your nutritionist or surgeon °• Your nutritionist and/or surgeon must approve all vitamin and mineral supplements °  °Activity and Exercise: It is important to continue walking at home. Limit your physical activity as instructed by your doctor. During this time, use these guidelines: °• Do not lift anything greater than ten  (10) pounds for at least two (2) weeks °• Do not go back to work or drive until your surgeon says you can °• You may have sex when you feel comfortable °o It is VERY important for female  patients to use a reliable birth control method; fertility often increase after surgery °o Do not get pregnant for at least 18 months °• Start exercising as soon as your doctor tells you that you can °o Make sure your doctor approves any physical activity °• Start with a simple walking program °• Walk 5-15 minutes each day, 7 days per week °• Slowly increase until you are walking 30-45 minutes per day °• Consider joining our BELT program. [phone] or email [email or web address] °  °Special Instructions Things to remember: °• Free counseling is available for you and your family through collaboration between [place] [facility] and [employer]. Please call [phone] and leave a message °• Use your CPAP when sleeping if this applies to you °• Consider buying a medical alert bracelet that says you had lap-band surgery °  °  You will likely have your first fill (fluid added to your band) 6 - 8 weeks after surgery °• [facility] has a free Bariatric Surgery Support Group that meets monthly, the 3rd Thursday, 6pm. [facility] Classrooms. You can see classes online at [email or web address] °• It is very important to keep all follow up appointments with your surgeon, nutritionist, primary care physician, and behavioral health practitioner °o After the first year, please follow up with your bariatric surgeon and nutritionist at least once a year in order to maintain best weight loss results °      °             [facility]:  [phone] ° °             [facility]: [phone] ° °             Bariatric Nurse Coordinator: [phone]  °Gastric Bypass/Sleeve Home Care Instructions  Rev. 06/2012    ° °                                                    Reviewed and Endorsed °                                                     by [facility] Patient Education Committee, Jan, 2016  [place] TO PATIENT   Activity:   Driving - may drive in 3 or 4 days, if doing well and off pain meds   Lifting - No lifting more than 15 pounds for one week, then no limit  Wound Care:   May shower  Diet:  Post op sleeve diet.  Drink plenty of water.  Follow up appointment:  Call Dr. Pollie Friar office [facility]) at [phone] for an appointment in 2 weeks.  Medications and dosages:  Resume your home medications.  Call Dr. Lucia Gaskins or his office  [phone]) if you have:  Temperature greater than 100.4,  Persistent nausea and vomiting,  Severe uncontrolled pain,  Redness, tenderness, or signs of infection (pain, swelling, redness, odor or green/yellow discharge around the site),  Difficulty breathing, headache or visual disturbances,  Any other questions or concerns you may have after discharge.  In an emergency, call 911 or go to an Emergency Department at a nearby hospital.

## 2015-02-26 ENCOUNTER — Telehealth (HOSPITAL_COMMUNITY): Payer: Self-pay

## 2015-02-26 NOTE — Telephone Encounter (Signed)
Attempted DROP discharge call, no answer, left message to return call   Made discharge phone call to patient per DROP protocol. Asking the following questions.    1. Do you have someone to care for you now that you are home?   2. Are you having pain now that is not relieved by your pain medication?   3. Are you able to drink the recommended daily amount of fluids (48 ounces minimum/day) and protein (60-80 grams/day) as prescribed by the dietitian or nutritional counselor?   4. Are you taking the vitamins and minerals as prescribed?   5. Do you have the "on call" number to contact your surgeon if you have a problem or question?   6. Are your incisions free of redness, swelling or drainage? (If steri strips, address that these can fall off, shower as tolerated)  7. Have your bowels moved since your surgery?  If not, are you passing gas?   8. Are you up and walking 3-4 times per day?      1. Do you have an appointment made to see your surgeon in the next month?   2. Were you provided your discharge medications before your surgery or before you were discharged from the hospital and are you taking them without problem?   3. Were you provided phone numbers to the clinic/surgeon's office?   4. Did you watch the patient education video module in the (clinic, surgeon's office, etc.) before your surgery?  5. Do you have a discharge checklist that was provided to you in the hospital to reference with instructions on how to take care of yourself after surgery?   6. Did you see a dietitian or nutritional counselor while you were in the hospital?   7. Do you have an appointment to see a dietitian or nutritional counselor in the next month?     

## 2015-03-06 ENCOUNTER — Encounter: Payer: PRIVATE HEALTH INSURANCE | Attending: Surgery

## 2015-03-06 DIAGNOSIS — Z713 Dietary counseling and surveillance: Secondary | ICD-10-CM | POA: Insufficient documentation

## 2015-03-06 DIAGNOSIS — Z6841 Body Mass Index (BMI) 40.0 and over, adult: Secondary | ICD-10-CM | POA: Diagnosis not present

## 2015-03-07 NOTE — Progress Notes (Signed)
Bariatric Class:  Appt start time: 1530 end time:  1630.  2 Week Post-Operative Nutrition Class  Patient was seen on 03/06/2015 for [facility]ative [facility]    Surgery date: 02/20/15 Surgery type: Sleeve gastrectomy Start weight at [facility]: 226 lbs on 08/03/14 Weight today: 217.5 lbs  Weight change: 16.2 lbs  TANITA  BODY COMP RESULTS  02/12/15 03/06/15   BMI (kg/m^2) N/A 39.8   Fat Mass (lbs)  108.5   Fat Free Mass (lbs)  109.0   Total Body Water (lbs)  80.0    The following the learning objectives were met by the patient during this course:  Identifies Phase 3A (Soft, High Proteins) Dietary Goals and will begin from 2 weeks post-operatively to 2 months post-operatively  Identifies appropriate sources of fluids and proteins   States protein recommendations and appropriate sources post-operatively  Identifies the need for appropriate texture modifications, mastication, and bite sizes when consuming solids  Identifies appropriate multivitamin and calcium sources post-operatively  Describes the need for physical activity post-operatively and will follow MD recommendations  States when to call healthcare provider regarding medication questions or post-operative complications  Handouts given during class include:  Phase 3A: Soft, High Protein Diet Handout  Follow-Up Plan: Patient will follow-up at [facility] in 6 weeks for 2 month post-op nutrition visit for diet advancement per MD.

## 2015-03-21 ENCOUNTER — Emergency Department (HOSPITAL_COMMUNITY): Payer: PRIVATE HEALTH INSURANCE

## 2015-03-21 ENCOUNTER — Encounter (HOSPITAL_COMMUNITY): Payer: Self-pay | Admitting: Emergency Medicine

## 2015-03-21 ENCOUNTER — Emergency Department (HOSPITAL_COMMUNITY)
Admission: EM | Admit: 2015-03-21 | Discharge: 2015-03-21 | Disposition: A | Discharge status: Home / Self Care | Payer: PRIVATE HEALTH INSURANCE | Attending: Emergency Medicine | Admitting: Emergency Medicine

## 2015-03-21 DIAGNOSIS — Z8639 Personal history of other endocrine, nutritional and metabolic disease: Secondary | ICD-10-CM | POA: Insufficient documentation

## 2015-03-21 DIAGNOSIS — H6641 Suppurative otitis media, unspecified, right ear: Secondary | ICD-10-CM | POA: Diagnosis not present

## 2015-03-21 DIAGNOSIS — J069 Acute upper respiratory infection, unspecified: Secondary | ICD-10-CM | POA: Diagnosis not present

## 2015-03-21 DIAGNOSIS — Z79899 Other long term (current) drug therapy: Secondary | ICD-10-CM | POA: Insufficient documentation

## 2015-03-21 DIAGNOSIS — Z87891 Personal history of nicotine dependence: Secondary | ICD-10-CM | POA: Diagnosis not present

## 2015-03-21 DIAGNOSIS — F419 Anxiety disorder, unspecified: Secondary | ICD-10-CM | POA: Insufficient documentation

## 2015-03-21 DIAGNOSIS — R110 Nausea: Secondary | ICD-10-CM | POA: Insufficient documentation

## 2015-03-21 DIAGNOSIS — H6692 Otitis media, unspecified, left ear: Secondary | ICD-10-CM

## 2015-03-21 DIAGNOSIS — I10 Essential (primary) hypertension: Secondary | ICD-10-CM | POA: Insufficient documentation

## 2015-03-21 DIAGNOSIS — M791 Myalgia: Secondary | ICD-10-CM | POA: Diagnosis not present

## 2015-03-21 DIAGNOSIS — J029 Acute pharyngitis, unspecified: Secondary | ICD-10-CM | POA: Diagnosis present

## 2015-03-21 DIAGNOSIS — F329 Major depressive disorder, single episode, unspecified: Secondary | ICD-10-CM | POA: Diagnosis not present

## 2015-03-21 LAB — RAPID STREP SCREEN (MED CTR MEBANE ONLY): STREPTOCOCCUS, GROUP A SCREEN (DIRECT): NEGATIVE

## 2015-03-21 LAB — INFLUENZA PANEL BY PCR (TYPE A & B)
H1N1 flu by pcr: NOT DETECTED
INFLAPCR: NEGATIVE
Influenza B By PCR: NEGATIVE

## 2015-03-21 MED ORDER — AMOXICILLIN 500 MG PO CAPS
1000.0000 mg | ORAL_CAPSULE | Freq: Three times a day (TID) | ORAL | Status: DC
Start: 2015-03-21 — End: 2018-06-21

## 2015-03-21 NOTE — Discharge Instructions (Signed)
Otitis Media, Adult Otitis media is redness, soreness, and inflammation of the middle ear. Otitis media may be caused by allergies or, most commonly, by infection. Often it occurs as a complication of the common cold. SIGNS AND SYMPTOMS Symptoms of otitis media may include:  Earache.  Fever.  Ringing in your ear.  Headache.  Leakage of fluid from the ear. DIAGNOSIS To diagnose otitis media, your health care provider will examine your ear with an otoscope. This is an instrument that allows your health care provider to see into your ear in order to examine your eardrum. Your health care provider also will ask you questions about your symptoms. TREATMENT  Typically, otitis media resolves on its own within 3-5 days. Your health care provider may prescribe medicine to ease your symptoms of pain. If otitis media does not resolve within 5 days or is recurrent, your health care provider may prescribe antibiotic medicines if he or she suspects that a bacterial infection is the cause. HOME CARE INSTRUCTIONS   If you were prescribed an antibiotic medicine, finish it all even if you start to feel better.  Take medicines only as directed by your health care provider.  Keep all follow-up visits as directed by your health care provider. SEEK MEDICAL CARE IF:  You have otitis media only in one ear, or bleeding from your nose, or both.  You notice a lump on your neck.  You are not getting better in 3-5 days.  You feel worse instead of better. SEEK IMMEDIATE MEDICAL CARE IF:   You have pain that is not controlled with medicine.  You have swelling, redness, or pain around your ear or stiffness in your neck.  You notice that part of your face is paralyzed.  You notice that the bone behind your ear (mastoid) is tender when you touch it. MAKE SURE YOU:   Understand these instructions.  Will watch your condition.  Will get help right away if you are not doing well or get worse.   This  information is not intended to replace advice given to you by your health care provider. Make sure you discuss any questions you have with your health care provider.   Document Released: 02/08/2004 Document Revised: 05/26/2014 Document Reviewed: 11/30/2012 Elsevier Interactive Patient Education 2016 [place] Drops, Adult You have been diagnosed with a condition requiring you to put drops of medicine into your outer ear. HOME CARE INSTRUCTIONS   Put drops in the affected ear as instructed. After putting the drops in, you will need to lie down with the affected ear facing up for ten minutes so the drops will remain in the ear canal and run down and fill the canal. Continue using the ear drops for as long as directed by your health care provider.  Prior to getting up, put a cotton ball gently in your ear canal. Leave enough of the cotton ball out so it can be easily removed. Do not attempt to push this down into the canal with a cotton-tipped swab or other instrument.  Do not irrigate or wash out your ears if you have had a perforated eardrum or mastoid surgery, or unless instructed to do so by your health care provider.  Keep appointments with your health care provider as instructed.  Finish all medicine, or use for the length of time prescribed by your health care provider. Continue the drops even if your problem seems to be doing well after a couple days, or continue as instructed.  SEEK MEDICAL CARE IF:  You become worse or develop increasing pain.  You notice any unusual drainage from your ear (particularly if the drainage has a bad smell).  You develop hearing difficulties.  You experience a serious form of dizziness in which you feel as if the room is spinning, and you feel nauseated (vertigo).  The outside of your ear becomes red or swollen or both. This may be a sign of an allergic reaction. MAKE SURE YOU:   Understand these instructions.  Will watch your  condition.  Will get help right away if you are not doing well or get worse.   This information is not intended to replace advice given to you by your health care provider. Make sure you discuss any questions you have with your health care provider.   Document Released: 04/29/2001 Document Revised: 05/26/2014 Document Reviewed: 11/30/2012 Elsevier Interactive Patient Education 2016 Else[employer] Inc[employer]  Upper Respiratory Infection, Adult Most upper respiratory infections (URIs) are a viral infection of the air passages leading to the lungs. A URI affects the nose, throat, and upper air passages. The most common type of URI is nasopharyngitis and is typically referred to as "the common cold." URIs run their course and usually go away on their own. Most of the time, a URI does not require medical attention, but sometimes a bacterial infection in the upper airways can follow a viral infection. This is called a secondary infection. Sinus and middle ear infections are common types of secondary upper respiratory infections. Bacterial pneumonia can also complicate a URI. A URI can worsen asthma and chronic obstructive pulmonary disease (COPD). Sometimes, these complications can require emergency medical care and may be life threatening.  CAUSES Almost all URIs are caused by viruses. A virus is a type of germ and can spread from one person to another.  RISKS FACTORS You may be at risk for a URI if:   You smoke.   You have chronic heart or lung disease.  You have a weakened defense (immune) system.   You are very young or very old.   You have nasal allergies or asthma.  You work in crowded or poorly ventilated areas.  You work in health care facilities or schools. SIGNS AND SYMPTOMS  Symptoms typically develop 2-3 days after you come in contact with a cold virus. Most viral URIs last 7-10 days. However, viral URIs from the influenza virus (flu virus) can last 14-18 days and are typically more  severe. Symptoms may include:   Runny or stuffy (congested) nose.   Sneezing.   Cough.   Sore throat.   Headache.   Fatigue.   Fever.   Loss of appetite.   Pain in your forehead, behind your eyes, and over your cheekbones (sinus pain).  Muscle aches.  DIAGNOSIS  Your health care provider may diagnose a URI by:  Physical exam.  Tests to check that your symptoms are not due to another condition such as:  Strep throat.  Sinusitis.  Pneumonia.  Asthma. TREATMENT  A URI goes away on its own with time. It cannot be cured with medicines, but medicines may be prescribed or recommended to relieve symptoms. Medicines may help:  Reduce your fever.  Reduce your cough.  Relieve nasal congestion. HOME CARE INSTRUCTIONS   Take medicines only as directed by your health care provider.   Gargle warm saltwater or take cough drops to comfort your throat as directed by your health care provider.  Use a warm mist humidifier  or inhale steam from a shower to increase air moisture. This may make it easier to breathe.  Drink enough fluid to keep your urine clear or pale yellow.   Eat soups and other clear broths and maintain good nutrition.   Rest as needed.   Return to work when your temperature has returned to normal or as your health care provider advises. You may need to stay home longer to avoid infecting others. You can also use a face mask and careful hand washing to prevent spread of the virus.  Increase the usage of your inhaler if you have asthma.   Do not use any tobacco products, including cigarettes, chewing tobacco, or electronic cigarettes. If you need help quitting, ask your health care provider. PREVENTION  The best way to protect yourself from getting a cold is to practice good hygiene.   Avoid oral or hand contact with people with cold symptoms.   Wash your hands often if contact occurs.  There is no clear evidence that vitamin C, vitamin  E, echinacea, or exercise reduces the chance of developing a cold. However, it is always recommended to get plenty of rest, exercise, and practice good nutrition.  SEEK MEDICAL CARE IF:   You are getting worse rather than better.   Your symptoms are not controlled by medicine.   You have chills.  You have worsening shortness of breath.  You have brown or red mucus.  You have yellow or brown nasal discharge.  You have pain in your face, especially when you bend forward.  You have a fever.  You have swollen neck glands.  You have pain while swallowing.  You have white areas in the back of your throat. SEEK IMMEDIATE MEDICAL CARE IF:   You have severe or persistent:  Headache.  Ear pain.  Sinus pain.  Chest pain.  You have chronic lung disease and any of the following:  Wheezing.  Prolonged cough.  Coughing up blood.  A change in your usual mucus.  You have a stiff neck.  You have changes in your:  Vision.  Hearing.  Thinking.  Mood. MAKE SURE YOU:   Understand these instructions.  Will watch your condition.  Will get help right away if you are not doing well or get worse.   This information is not intended to replace advice given to you by your health care provider. Make sure you discuss any questions you have with your health care provider.   Document Released: 10/29/2000 Document Revised: 09/19/2014 Document Reviewed: 08/10/2013 Elsevier Interactive Patient Education [employer].

## 2015-03-21 NOTE — ED Notes (Signed)
Patient states sore throat, nasal congestion, body aches, ear stopped up, fevers since Monday.   Patient states has been taking tylenol for fever at home.

## 2015-03-21 NOTE — ED Provider Notes (Signed)
CSN: [id number]     Arrival date & time 03/21/15  0830 History  By signing my name below, I, [name], attest that this documentation has been prepared under the direction and in the presence of Delsa Grana, PA-C.  Electronically Signed: Tula Nichols, ED Scribe. 03/21/2015. 10:02 AM.   Chief Complaint  Patient presents with  . flu like symptoms    The history is provided by the patient. No language interpreter was used.   HPI Comments: [name] is a 54 y.o. female who presents to the Emergency Department complaining of constant, moderate sore throat that started 3 days ago with associated nausea, left ear pain, non-productive cough, myalgias. She also notes fever of 102 that started today. Pt tried Tylenol this morning with no relief. Pt works in health care. Her granddaughter was diagnosed with strep throat last week. She had a flu shot last week. Pt had laparoscopic gastric sleeve resection 4 weeks ago, but denies any problems following the surgery. Pt denies smoking. She also denies abdominal pain, hemoptysis, ear drainage, rhinorrhea, post-nasal drip, congestion, HA, chest pain, SOB, LE edema, papitations.  She notes that she has been having difficulty with control of her GERD and believes that it may be part of her throat pain.  Pt also states that she had tubes removed from ears roughly 6 months ago.  Past Medical History  Diagnosis Date  . Hypertension   . OSA (obstructive sleep apnea) 03/09/2013  . Hyperlipidemia   . Sleep apnea   . Anxiety   . Depression    Past Surgical History  Procedure Laterality Date  . Partial hysterectomy    . Thyroid surgery    . Back surgery    . Sleep study  04/08/13  . Breath tek h pylori N/A 08/31/2014    Procedure: BREATH TEK H PYLORI;  Surgeon: Alphonsa Overall, MD;  Location: [facility];  Service: General;  Laterality: N/A;  . Laparoscopic gastric sleeve resection N/A 02/20/2015    Procedure: LAPAROSCOPIC GASTRIC SLEEVE RESECTION;   Surgeon: Alphonsa Overall, MD;  Location: [facility];  Service: General;  Laterality: N/A;   Family History  Problem Relation Age of Onset  . COPD Mother   . Hypertension Mother   . Heart disease Mother   . Heart attack Father   . Heart disease Father   . Colon cancer Neg Hx   . Hypertension Sister   . Hypertension Brother    Social History  Substance Use Topics  . Smoking status: Former Smoker -- 0.50 packs/day    Types: Cigarettes    Quit date: 07/02/2014  . Smokeless tobacco: Never Used  . Alcohol Use: No   OB History    No data available     Review of Systems  Constitutional: Positive for fever and chills.  HENT: Positive for ear pain and sore throat. Negative for congestion, ear discharge, postnasal drip and rhinorrhea.   Respiratory: Positive for cough. Negative for choking, chest tightness, shortness of breath, wheezing and stridor.   Cardiovascular: Negative for chest pain, palpitations and leg swelling.  Gastrointestinal: Positive for nausea. Negative for vomiting, abdominal pain, diarrhea and constipation.  Genitourinary: Negative.   Musculoskeletal: Positive for myalgias.  Skin: Negative.  Negative for rash.  Neurological: Negative.   Psychiatric/Behavioral: Negative.   All other systems reviewed and are negative.  Allergies  Prednisone  Home Medications   Prior to Admission medications   Medication Sig Start Date End Date Taking? Authorizing Provider  amLODipine (NORVASC)  10 MG tablet Take 1 tablet by mouth every morning. 01/19/15   Historical Provider, MD  amoxicillin (AMOXIL) 500 MG capsule Take 2 capsules (1,000 mg total) by mouth 3 (three) times daily. 03/21/15   Delsa Grana, PA-C  aspirin EC 81 MG tablet Take 1 tablet (81 mg total) by mouth daily. Patient not taking: Reported on 02/09/2015 01/13/14   Kelvin Cellar, MD  Cholecalciferol (VITAMIN D-3) 5000 UNITS TABS Take 1 tablet by mouth every morning.     Historical Provider, MD  hydrochlorothiazide (MICROZIDE)  12.5 MG capsule Take 12.5 mg by mouth every morning.     Historical Provider, MD  ondansetron (ZOFRAN ODT) 4 MG disintegrating tablet Take 1 tablet (4 mg total) by mouth every 8 (eight) hours as needed for nausea or vomiting. Patient not taking: Reported on 02/09/2015 03/22/14   Mercedes Camprubi-Soms, PA-C  oxyCODONE-acetaminophen (PERCOCET) 5-325 MG per tablet Take 1 tablet by mouth every 6 (six) hours as needed for severe pain. Patient not taking: Reported on 02/09/2015 03/22/14   Mercedes Camprubi-Soms, PA-C  tamsulosin (FLOMAX) 0.4 MG CAPS capsule Take 1 capsule (0.4 mg total) by mouth daily after supper. Take until stone has passed Patient not taking: Reported on 02/09/2015 03/22/14   Mercedes Camprubi-Soms, PA-C  venlafaxine (EFFEXOR) 37.5 MG tablet Take 37.5 mg by mouth every evening.     Historical Provider, MD   BP 136/81 mmHg  Pulse 65  Temp(Src) 98.5 F (36.9 C) (Oral)  Resp 20  Ht 5\' 2"  (1.575 m)  Wt 213 lb (96.616 kg)  BMI 38.95 kg/m2  SpO2 96% Physical Exam  Constitutional: She is oriented to person, place, and time. She appears well-developed and well-nourished. No distress.  HENT:  Head: Normocephalic and atraumatic.  Mouth/Throat: Oropharynx is clear and moist. No oropharyngeal exudate.  Posterior oropharynx erythematous, no exudate Left ear: perforated TM, small circular defect with mild erythema of the edges of the TM, TM opaque Right ear: Opaque TM with erythema, without visible landmarks, obscured cone of light Clear nasal discharge with erythematous nasal mucosa  Eyes: Conjunctivae and EOM are normal. Pupils are equal, round, and reactive to light. Right eye exhibits no discharge. Left eye exhibits no discharge. No scleral icterus.  Neck: Normal range of motion. Neck supple. No JVD present. No tracheal deviation present. No thyromegaly present.  Cardiovascular: Normal rate, regular rhythm, normal heart sounds and intact distal pulses.  Exam reveals no gallop and no  friction rub.   No murmur heard. Pulmonary/Chest: Effort normal and breath sounds normal. No respiratory distress. She has no wheezes. She has no rales. She exhibits no tenderness.  Breath sounds coarse in left lower lung fields No wheezing No rhonchi  No rales  Abdominal: Soft. Bowel sounds are normal. She exhibits no distension and no mass. There is no tenderness. There is no rebound and no guarding.  Musculoskeletal: Normal range of motion. She exhibits no edema or tenderness.  Lymphadenopathy:    She has no cervical adenopathy.  Neurological: She is alert and oriented to person, place, and time. She has normal reflexes. No cranial nerve deficit. She exhibits normal muscle tone. Coordination normal.  Skin: Skin is warm and dry. No rash noted. She is not diaphoretic. No erythema. No pallor.  Psychiatric: She has a normal mood and affect. Her behavior is normal. Judgment and thought content normal.  Nursing note and vitals reviewed.   ED Course  Procedures  DIAGNOSTIC STUDIES: Oxygen Saturation is 100% on RA, normal by my interpretation.  COORDINATION OF CARE: 10:03 AM Discussed treatment plan with pt which includes chest x-ray, strep test and influenza panel. Pt agreed to plan.  Labs Review Labs Reviewed  RAPID STREP SCREEN (NOT AT [facility])  CULTURE, GROUP A STREP  INFLUENZA PANEL BY PCR (TYPE A & B, H1N1)    Imaging Review Dg Chest 2 View  03/21/2015  CLINICAL DATA:  Cough and fever; sore throat EXAM: CHEST  2 VIEW COMPARISON:  January 12, 2014 FINDINGS: Lungs are clear. Heart size and pulmonary vascularity are normal. No adenopathy. No bone lesions. IMPRESSION: No edema or consolidation. Electronically Signed   By: Lowella Grip III M.D.   On: 03/21/2015 10:57   I have personally reviewed and evaluated these images and lab results as part of my medical decision-making.   EKG Interpretation None      MDM   Final diagnoses:  Suppurative otitis media of right ear  without spontaneous rupture of tympanic membrane, recurrence not specified, unspecified chronicity  Left otitis media, recurrence not specified, unspecified chronicity, unspecified otitis media type  URI (upper respiratory infection)  Pharyngitis    PT with URI and associated pharyngitis, likely viral.  She has left TM perforation or defect from recent tubes being removed.  Right TM with signs of acute otitis.  Will treat with Abx, but pt was advised to follow up with her ENT for further eval of left TM. Filed Vitals:   03/21/15 0835 03/21/15 0845 03/21/15 1005 03/21/15 1116  BP: 148/82  115/101 136/81  Pulse: 95  71 65  Temp:   98.3 F (36.8 C) 98.5 F (36.9 C)  TempSrc:   Oral Oral  Resp: 20     Height:  5\' 2"  (1.575 m)    Weight:  213 lb (96.616 kg)    SpO2: 100%  95% 96%   I personally performed the services described in this documentation, which was scribed in my presence. The recorded information has been reviewed and is accurate.      Delsa Grana, PA-C 03/23/15 [id number]  Orlie Dakin, MD 03/28/15 1239

## 2015-03-23 LAB — CULTURE, GROUP A STREP: Strep A Culture: NEGATIVE

## 2015-04-17 ENCOUNTER — Encounter: Payer: Self-pay | Admitting: Dietician

## 2015-04-17 ENCOUNTER — Encounter: Payer: PRIVATE HEALTH INSURANCE | Attending: Surgery | Admitting: Dietician

## 2015-04-17 DIAGNOSIS — Z6841 Body Mass Index (BMI) 40.0 and over, adult: Secondary | ICD-10-CM | POA: Diagnosis not present

## 2015-04-17 DIAGNOSIS — Z713 Dietary counseling and surveillance: Secondary | ICD-10-CM | POA: Insufficient documentation

## 2015-04-17 NOTE — Progress Notes (Signed)
  Follow-up visit:  8 Weeks Post-Operative Sleeve Gastrectomy Surgery  Medical Nutrition Therapy:  Appt start time: [id number] end time:  1730.  Primary concerns today: Post-operative Bariatric Surgery Nutrition Management. Returns with a 17.5 lbs weight loss in past 6 weeks. Having a lot of heartburn and taking Protonix which has not been helping. Not having problems tolerating any foods. No longer using CPAP.  Surgery date: 02/20/15 Surgery type: Sleeve gastrectomy Start weight at [facility]: 226 lbs on 08/03/14 Weight today: 200.0 lbs  Weight change: 17.5 lbs Total weight loss: 26 lbs  TANITA  BODY COMP RESULTS  02/12/15 03/06/15 04/17/15   BMI (kg/m^2) N/A 39.8 36.6   Fat Mass (lbs)  108.5 91.5   Fat Free Mass (lbs)  109.0 108.5   Total Body Water (lbs)  80.0 79.5    Preferred Learning Style:   No preference indicated   Learning Readiness:   Ready  24-hr recall: B (AM): boiled egg or wheat bread plain (0-6g) Snk (AM): Premier Protein (30 g)  L (PM): smoked [place] and cheese (10 g) Snk (PM): none D (PM): 1 oz red meat with 1 oz of green vegetables (7 g) Snk (PM): none   Fluid intake: 64 water, 8 oz unsweet tea with splenda, 1% milk sometimes, 11 oz protein shake Estimated total protein intake: ~53 g  Medications: see list  Supplementation: taking  Using straws: No Drinking while eating: No Hair loss: No Carbonated beverages: No N/V/D/C: No Dumping syndrome: No  Recent physical activity:  Walking most days 30-60 minutes  Progress Towards Goal(s):  In progress.  Handouts given during visit include:  Phase 3B High Protein + Non Starchy Vegetables   Nutritional Diagnosis:  [place]-3.3 Overweight/obesity related to past poor dietary habits and physical inactivity as evidenced by patient w/ recent sleeve gastrectomy surgery following dietary guidelines for continued weight loss.    Intervention:  Nutrition education/diet advancement. Goals:  Follow Phase 3B: High Protein +  Non-Starchy Vegetables  Eat 3-6 small meals/snacks, every 3-5 hrs  Increase lean protein foods to meet 60g goal  Increase fluid intake to 64oz +  Avoid drinking 15 minutes before, during and 30 minutes after eating  Aim for >30 min of physical activity daily  Teaching Method Utilized:  Visual Auditory Hands on  Barriers to learning/adherence to lifestyle change: none  Demonstrated degree of understanding via:  Teach Back   Monitoring/Evaluation:  Dietary intake, exercise, and body weight. Follow up in 1 months for 3 month post-op visit.

## 2015-04-17 NOTE — Patient Instructions (Signed)
Goals:  Follow Phase 3B: High Protein + Non-Starchy Vegetables  Eat 3-6 small meals/snacks, every 3-5 hrs  Increase lean protein foods to meet 60g goal  Increase fluid intake to 64oz +  Avoid drinking 15 minutes before, during and 30 minutes after eating  Aim for >30 min of physical activity daily  Surgery date: 02/20/15 Surgery type: Sleeve gastrectomy Start weight at [facility]: 226 lbs on 08/03/14 Weight today: 200.0 lbs  Weight change: 17.5 lbs Total weight loss: 26 lbs  TANITA  BODY COMP RESULTS  02/12/15 03/06/15 04/17/15   BMI (kg/m^2) N/A 39.8 36.6   Fat Mass (lbs)  108.5 91.5   Fat Free Mass (lbs)  109.0 108.5   Total Body Water (lbs)  80.0 79.5

## 2015-06-13 ENCOUNTER — Ambulatory Visit: Payer: PRIVATE HEALTH INSURANCE | Admitting: Dietician

## 2015-12-07 ENCOUNTER — Ambulatory Visit
Admission: RE | Admit: 2015-12-07 | Discharge: 2015-12-07 | Disposition: A | Discharge status: Home / Self Care | Payer: PRIVATE HEALTH INSURANCE | Source: Ambulatory Visit | Attending: Internal Medicine | Admitting: Internal Medicine

## 2015-12-07 ENCOUNTER — Other Ambulatory Visit (HOSPITAL_COMMUNITY): Payer: Self-pay | Admitting: Internal Medicine

## 2015-12-07 ENCOUNTER — Other Ambulatory Visit: Payer: Self-pay | Admitting: Internal Medicine

## 2015-12-07 DIAGNOSIS — R42 Dizziness and giddiness: Secondary | ICD-10-CM

## 2015-12-07 DIAGNOSIS — R519 Headache, unspecified: Secondary | ICD-10-CM

## 2015-12-07 DIAGNOSIS — R51 Headache: Secondary | ICD-10-CM

## 2015-12-14 ENCOUNTER — Encounter (HOSPITAL_COMMUNITY): Payer: PRIVATE HEALTH INSURANCE

## 2015-12-14 ENCOUNTER — Other Ambulatory Visit (HOSPITAL_COMMUNITY): Payer: PRIVATE HEALTH INSURANCE

## 2015-12-20 ENCOUNTER — Ambulatory Visit (HOSPITAL_BASED_OUTPATIENT_CLINIC_OR_DEPARTMENT_OTHER)
Admission: RE | Admit: 2015-12-20 | Discharge: 2015-12-20 | Disposition: A | Discharge status: Home / Self Care | Payer: PRIVATE HEALTH INSURANCE | Source: Ambulatory Visit | Attending: Internal Medicine | Admitting: Internal Medicine

## 2015-12-20 ENCOUNTER — Ambulatory Visit (HOSPITAL_COMMUNITY)
Admission: RE | Admit: 2015-12-20 | Discharge: 2015-12-20 | Disposition: A | Discharge status: Home / Self Care | Payer: PRIVATE HEALTH INSURANCE | Source: Ambulatory Visit | Attending: Internal Medicine | Admitting: Internal Medicine

## 2015-12-20 DIAGNOSIS — R51 Headache: Secondary | ICD-10-CM | POA: Insufficient documentation

## 2015-12-20 DIAGNOSIS — R42 Dizziness and giddiness: Secondary | ICD-10-CM | POA: Diagnosis present

## 2015-12-20 DIAGNOSIS — R519 Headache, unspecified: Secondary | ICD-10-CM

## 2015-12-20 DIAGNOSIS — I6523 Occlusion and stenosis of bilateral carotid arteries: Secondary | ICD-10-CM | POA: Insufficient documentation

## 2015-12-20 LAB — VAS US CAROTID
LCCADSYS: -68 cm/s
LCCAPDIAS: 22 cm/s
LCCAPSYS: 91 cm/s
LEFT ECA DIAS: -15 cm/s
LEFT VERTEBRAL DIAS: -12 cm/s
LICAPDIAS: -23 cm/s
Left CCA dist dias: -24 cm/s
Left ICA dist dias: -36 cm/s
Left ICA dist sys: -78 cm/s
Left ICA prox sys: -59 cm/s
RCCAPDIAS: 17 cm/s
RCCAPSYS: 78 cm/s
RIGHT ECA DIAS: -19 cm/s
RIGHT VERTEBRAL DIAS: -13 cm/s
Right cca dist sys: -65 cm/s

## 2015-12-20 NOTE — Progress Notes (Signed)
VASCULAR LAB PRELIMINARY  PRELIMINARY  PRELIMINARY  PRELIMINARY  Carotid duplex has been completed.     Bilateral:  1-39% ICA stenosis.  Vertebral artery flow is antegrade.     Rayvion Stumph, RVT, RDMS 12/20/2015, 3:03 PM

## 2015-12-20 NOTE — Progress Notes (Signed)
Echocardiogram 2D Echocardiogram has been performed.  [name] 12/20/2015, 3:33 PM

## 2016-02-06 ENCOUNTER — Ambulatory Visit: Payer: PRIVATE HEALTH INSURANCE | Admitting: Podiatry

## 2016-02-18 ENCOUNTER — Ambulatory Visit: Payer: PRIVATE HEALTH INSURANCE | Admitting: Podiatry

## 2016-02-28 ENCOUNTER — Other Ambulatory Visit: Payer: Self-pay | Admitting: Obstetrics and Gynecology

## 2016-02-28 DIAGNOSIS — Z1231 Encounter for screening mammogram for malignant neoplasm of breast: Secondary | ICD-10-CM

## 2016-03-17 ENCOUNTER — Ambulatory Visit: Payer: PRIVATE HEALTH INSURANCE

## 2016-09-23 ENCOUNTER — Encounter (HOSPITAL_COMMUNITY): Payer: Self-pay

## 2016-10-27 ENCOUNTER — Ambulatory Visit: Payer: PRIVATE HEALTH INSURANCE

## 2016-12-31 ENCOUNTER — Emergency Department (HOSPITAL_COMMUNITY): Payer: PRIVATE HEALTH INSURANCE

## 2016-12-31 ENCOUNTER — Encounter (HOSPITAL_COMMUNITY): Payer: Self-pay

## 2016-12-31 ENCOUNTER — Emergency Department (HOSPITAL_COMMUNITY)
Admission: EM | Admit: 2016-12-31 | Discharge: 2016-12-31 | Disposition: A | Discharge status: Home / Self Care | Payer: PRIVATE HEALTH INSURANCE | Attending: Physician Assistant | Admitting: Physician Assistant

## 2016-12-31 DIAGNOSIS — K2100 Gastro-esophageal reflux disease with esophagitis, without bleeding: Secondary | ICD-10-CM

## 2016-12-31 DIAGNOSIS — E785 Hyperlipidemia, unspecified: Secondary | ICD-10-CM | POA: Insufficient documentation

## 2016-12-31 DIAGNOSIS — I10 Essential (primary) hypertension: Secondary | ICD-10-CM | POA: Insufficient documentation

## 2016-12-31 DIAGNOSIS — F17210 Nicotine dependence, cigarettes, uncomplicated: Secondary | ICD-10-CM | POA: Insufficient documentation

## 2016-12-31 DIAGNOSIS — R079 Chest pain, unspecified: Secondary | ICD-10-CM | POA: Diagnosis present

## 2016-12-31 DIAGNOSIS — Z79899 Other long term (current) drug therapy: Secondary | ICD-10-CM | POA: Diagnosis not present

## 2016-12-31 LAB — CBC
HEMATOCRIT: 43.5 % (ref 36.0–46.0)
HEMOGLOBIN: 14.6 g/dL (ref 12.0–15.0)
MCH: 30.1 pg (ref 26.0–34.0)
MCHC: 33.6 g/dL (ref 30.0–36.0)
MCV: 89.7 fL (ref 78.0–100.0)
Platelets: 240 10*3/uL (ref 150–400)
RBC: 4.85 MIL/uL (ref 3.87–5.11)
RDW: 12.7 % (ref 11.5–15.5)
WBC: 6.1 10*3/uL (ref 4.0–10.5)

## 2016-12-31 LAB — BASIC METABOLIC PANEL
ANION GAP: 8 (ref 5–15)
BUN: 18 mg/dL (ref 6–20)
CHLORIDE: 106 mmol/L (ref 101–111)
CO2: 27 mmol/L (ref 22–32)
Calcium: 9 mg/dL (ref 8.9–10.3)
Creatinine, Ser: 0.85 mg/dL (ref 0.44–1.00)
GFR calc Af Amer: >60 mL/min (ref >60)
GLUCOSE: 103 mg/dL — AB (ref 65–99)
POTASSIUM: 4.4 mmol/L (ref 3.5–5.1)
SODIUM: 141 mmol/L (ref 135–145)

## 2016-12-31 LAB — COMPREHENSIVE METABOLIC PANEL
ALK PHOS: 80 U/L (ref 38–126)
ALT: 15 U/L (ref 14–54)
AST: 18 U/L (ref 15–41)
Albumin: 3.7 g/dL (ref 3.5–5.0)
Anion gap: 8 (ref 5–15)
BILIRUBIN TOTAL: 1.5 mg/dL — AB (ref 0.3–1.2)
BUN: 16 mg/dL (ref 6–20)
CALCIUM: 8.7 mg/dL — AB (ref 8.9–10.3)
CO2: 25 mmol/L (ref 22–32)
CREATININE: 0.8 mg/dL (ref 0.44–1.00)
Chloride: 106 mmol/L (ref 101–111)
Glucose, Bld: 95 mg/dL (ref 65–99)
Potassium: 4.1 mmol/L (ref 3.5–5.1)
Sodium: 139 mmol/L (ref 135–145)
Total Protein: 5.9 g/dL — ABNORMAL LOW (ref 6.5–8.1)

## 2016-12-31 LAB — I-STAT TROPONIN, ED
TROPONIN I, POC: 0 ng/mL (ref 0.00–0.08)
Troponin i, poc: 0 ng/mL (ref 0.00–0.08)

## 2016-12-31 MED ORDER — IOPAMIDOL (ISOVUE-370) INJECTION 76%
INTRAVENOUS | Status: AC
  Administered 2016-12-31: 100 mL via INTRAVENOUS
  Filled 2016-12-31: qty 100

## 2016-12-31 MED ORDER — GI COCKTAIL ~~LOC~~
30.0000 mL | Freq: Once | ORAL | Status: AC
  Administered 2016-12-31: 30 mL via ORAL
  Filled 2016-12-31: qty 30

## 2016-12-31 MED ORDER — ACETAMINOPHEN 325 MG PO TABS
650.0000 mg | ORAL_TABLET | Freq: Once | ORAL | Status: AC
  Administered 2016-12-31: 650 mg via ORAL
  Filled 2016-12-31: qty 2

## 2016-12-31 MED ORDER — FAMOTIDINE 40 MG/5ML PO SUSR
20.0000 mg | Freq: Once | ORAL | Status: DC
  Filled 2016-12-31: qty 2.5

## 2016-12-31 MED ORDER — OMEPRAZOLE 20 MG PO CPDR: 20.0000 mg | DELAYED_RELEASE_CAPSULE | Freq: Every day | ORAL | 0 refills | Status: DC

## 2016-12-31 MED ORDER — ALUM & MAG HYDROXIDE-SIMETH 400-400-40 MG/5ML PO SUSP: 10.0000 mL | Freq: Four times a day (QID) | ORAL | 0 refills | Status: DC | PRN

## 2016-12-31 NOTE — ED Notes (Signed)
Repeat EKG done per Dr. Ned Grace verbal order

## 2016-12-31 NOTE — ED Notes (Signed)
Dr Thomasene Lot made aware of pts ongoing chest pain after gi cocktail

## 2016-12-31 NOTE — ED Triage Notes (Signed)
Pt endorses sudden central chest pain with radiation to the left shoulder that began 1 hour ago. Pt has been having indigestion sx x 2 days. Pt hypertensive in triage. Denies shob, dizziness, n/v.

## 2016-12-31 NOTE — ED Provider Notes (Signed)
[facility] Provider Note   CSN: [id number] Arrival date & time: 12/31/16  1121     History   Chief Complaint Chief Complaint  Patient presents with  . Chest Pain    HPI [name] is a 56 y.o. female.  HPI   56 year old female with past medical history of hypertension sleep apnea and gastric sleeve. She is presenting today with chest pain centrally located radiating to her back. She reports that the tearing sensation.  Patient however also states that she's been having GERD like symptoms for the last week. She reports it is worse at night. Worse when lying down. Wakes her up in the middle of the night.  Past Medical History:  Diagnosis Date  . Anxiety   . Depression   . Hyperlipidemia   . Hypertension   . OSA (obstructive sleep apnea) 03/09/2013  . Sleep apnea     Patient Active Problem List   Diagnosis Date Noted  . Morbid obesity ([place]) 02/20/2015  . Chest pain, unspecified 01/13/2014  . Chest pain 01/13/2014  . Abnormal EKG- inferior TWI 01/13/2014  . OSA (obstructive sleep apnea) 03/09/2013    Past Surgical History:  Procedure Laterality Date  . BACK SURGERY    . BREATH TEK H PYLORI N/A 08/31/2014   Procedure: BREATH TEK H PYLORI;  Surgeon: Alphonsa Overall, MD;  Location: [facility];  Service: General;  Laterality: N/A;  . LAPAROSCOPIC GASTRIC SLEEVE RESECTION N/A 02/20/2015   Procedure: LAPAROSCOPIC GASTRIC SLEEVE RESECTION;  Surgeon: Alphonsa Overall, MD;  Location: [facility];  Service: General;  Laterality: N/A;  . PARTIAL HYSTERECTOMY    . sleep study  04/08/13  . THYROID SURGERY      OB History    No data available       Home Medications    Prior to Admission medications   Medication Sig Start Date End Date Taking? Authorizing Provider  Cholecalciferol (VITAMIN D-3) 5000 UNITS TABS Take 1 tablet by mouth every morning.    Yes [provider]  ibuprofen (ADVIL,MOTRIN) 200 MG tablet Take 200 mg by mouth every 6 (six) hours as  needed for moderate pain.   Yes [provider]  venlafaxine XR (EFFEXOR-XR) 75 MG 24 hr capsule Take 75 mg by mouth daily. 12/11/16  Yes [provider]  alum & mag hydroxide-simeth (MAALOX MAX) 400-400-40 MG/5ML suspension Take 10 mLs by mouth every 6 (six) hours as needed for indigestion. 12/31/16   Nethaniel Mattie Lyn, MD  amoxicillin (AMOXIL) 500 MG capsule Take 2 capsules (1,000 mg total) by mouth 3 (three) times daily. Patient not taking: Reported on 12/31/2016 03/21/15   Delsa Grana, PA-C  aspirin EC 81 MG tablet Take 1 tablet (81 mg total) by mouth daily. Patient not taking: Reported on 02/09/2015 01/13/14   Kelvin Cellar, MD  omeprazole (PRILOSEC) 20 MG capsule Take 1 capsule (20 mg total) by mouth daily. 12/31/16   Randie Bloodgood Lyn, MD  ondansetron (ZOFRAN ODT) 4 MG disintegrating tablet Take 1 tablet (4 mg total) by mouth every 8 (eight) hours as needed for nausea or vomiting. Patient not taking: Reported on 02/09/2015 03/22/14   Street, [place], PA-C  oxyCODONE-acetaminophen (PERCOCET) 5-325 MG per tablet Take 1 tablet by mouth every 6 (six) hours as needed for severe pain. Patient not taking: Reported on 02/09/2015 03/22/14   Street, [place], PA-C  tamsulosin (FLOMAX) 0.4 MG CAPS capsule Take 1 capsule (0.4 mg total) by mouth daily after supper. Take until stone has passed  Patient not taking: Reported on 02/09/2015 03/22/14   Street, [place], PA-C    Family History Family History  Problem Relation Age of Onset  . COPD Mother   . Hypertension Mother   . Heart disease Mother   . Heart attack Father   . Heart disease Father   . Hypertension Sister   . Hypertension Brother   . Colon cancer Neg Hx     Social History Social History  Substance Use Topics  . Smoking status: Current Every Day Smoker    Packs/day: 0.50    Types: Cigarettes  . Smokeless tobacco: Never Used  . Alcohol use No     Allergies   Prednisone   Review of Systems Review of  Systems  Constitutional: Negative for activity change and fatigue.  Eyes: Negative for discharge.  Respiratory: Negative for cough, chest tightness and shortness of breath.   Cardiovascular: Negative for chest pain.  Gastrointestinal: Positive for abdominal pain.  Genitourinary: Negative for difficulty urinating and dysuria.  Musculoskeletal: Negative for joint swelling.  Skin: Negative for rash.  Allergic/Immunologic: Negative for immunocompromised state.  Neurological: Negative for seizures and speech difficulty.  Psychiatric/Behavioral: Negative for agitation and behavioral problems.  All other systems reviewed and are negative.    Physical Exam Updated Vital Signs BP (!) 146/86   Pulse (!) 47   Temp 98 F (36.7 C) (Oral)   Resp 16   Ht 5\' 4"  (1.626 m)   Wt 88.5 kg (195 lb)   SpO2 92%   BMI 33.47 kg/m   Physical Exam  Constitutional: She is oriented to person, place, and time. She appears well-developed and well-nourished.  HENT:  Head: Normocephalic and atraumatic.  Eyes: Right eye exhibits no discharge.  Cardiovascular: Normal rate, regular rhythm and normal heart sounds.   No murmur heard. Pulmonary/Chest: Effort normal and breath sounds normal. She has no wheezes. She has no rales.  Abdominal: Soft. She exhibits no distension. There is no tenderness.  Neurological: She is oriented to person, place, and time.  Skin: Skin is warm and dry. She is not diaphoretic.  Psychiatric: She has a normal mood and affect.  Nursing note and vitals reviewed.    ED Treatments / Results  Labs (all labs ordered are listed, but only abnormal results are displayed) Labs Reviewed  BASIC METABOLIC PANEL - Abnormal; Notable for the following:       Result Value   Glucose, Bld 103 (*)    All other components within normal limits  COMPREHENSIVE METABOLIC PANEL - Abnormal; Notable for the following:    Calcium 8.7 (*)    Total Protein 5.9 (*)    Total Bilirubin 1.5 (*)    All  other components within normal limits  CBC  I-STAT TROPONIN, ED  I-STAT TROPONIN, ED    EKG  EKG Interpretation  Date/Time:  Wednesday December 31 2016 11:52:56 EDT Ventricular Rate:  56 PR Interval:  156 QRS Duration: 80 QT Interval:  434 QTC Calculation: 418 R Axis:   -13 Text Interpretation:  Sinus bradycardia Low voltage QRS Nonspecific ST abnormality Abnormal ECG mild ST falttening, very similar to prior EKG Confirmed by Zenovia Jarred [phone]) on 12/31/2016 12:03:50 PM       Radiology Dg Chest 2 View  Result Date: 12/31/2016 CLINICAL DATA:  Chest pain. EXAM: CHEST  2 VIEW COMPARISON:  03/21/2015 FINDINGS: The heart size and mediastinal contours are within normal limits. Both lungs are clear. The visualized skeletal structures are unremarkable. IMPRESSION: Normal exam.  Electronically Signed   By: Lorriane Shire M.D.   On: 12/31/2016 12:39   Ct Angio Chest Aorta W And/or Wo Contrast  Result Date: 12/31/2016 CLINICAL DATA:  Chest pain radiating to the back and left shoulder. EXAM: CT ANGIOGRAPHY CHEST WITH CONTRAST TECHNIQUE: Multidetector CT imaging of the chest was performed using the standard protocol during bolus administration of intravenous contrast. Multiplanar CT image reconstructions and MIPs were obtained to evaluate the vascular anatomy. CONTRAST:  100 cc Isovue 370 intravenous contrast. COMPARISON:  Chest x-ray from same day. FINDINGS: Cardiovascular: Preferential opacification of the thoracic aorta. No evidence of thoracic aortic aneurysm or dissection. Normal heart size. No pericardial effusion. No pulmonary embolism. Mild enlargement of the main pulmonary artery, measuring up to 3.1 cm. Mediastinum/Nodes: No enlarged mediastinal, hilar, or axillary lymph nodes. Thyroid gland, trachea, and esophagus demonstrate no significant findings. Lungs/Pleura: Mild bibasilar atelectasis. Left lower lobe atelectasis/scarring. 2 mm subpleural pulmonary nodule in the left upper lobe.  No suspicious pulmonary nodule. Mild centrilobular emphysema. No pleural effusion or pneumothorax. Upper Abdomen: No acute abnormality.  Prior sleeve gastrectomy. Musculoskeletal: No chest wall abnormality. No acute or significant osseous findings. Review of the MIP images confirms the above findings. IMPRESSION: 1. No evidence of acute thoracic aortic syndrome. No thoracic aortic aneurysm. 2. No acute intrathoracic process. 3. Mild enlargement of the main pulmonary artery, nonspecific, but can be seen with pulmonary arterial hypertension. 4.  Emphysema (ICD10-J43.9). Electronically Signed   By: Titus Dubin M.D.   On: 12/31/2016 13:05    Procedures Procedures (including critical care time)  Medications Ordered in ED Medications  gi cocktail (Maalox,Lidocaine,Donnatal) (30 mLs Oral Given 12/31/16 1327)  iopamidol (ISOVUE-370) 76 % injection (100 mLs Intravenous Contrast Given 12/31/16 1230)  acetaminophen (TYLENOL) tablet 650 mg (650 mg Oral Given 12/31/16 1501)     Initial Impression / Assessment and Plan / ED Course  I have reviewed the triage vital signs and the nursing notes.  Pertinent labs & imaging results that were available during my care of the patient were reviewed by me and considered in my medical decision making (see chart for details).    Patient is a 56 year old female presenting with epigastric pain radiating to her back. She reports that it is been going on for the last several weeks on and off. She reports it got a lot worse today. She reports worse in the middle the night around 2 to 3 AM. Believe this represents GERD versus gastric reflux disease. However given her acuity on arrival. We did a CT for dissection. This is negative.  EKG is nonischemic. We will do serial troponins. Patient's heart score is 2-3. She no longer has hypertension or hyperlipidemia or DM after gastric sleeve. She has miminimal  ST changes and negative troponin.  Pt symptoms resolved with  famotidine.   We will start a PPI for the next several weeks. We'll give her follow-up with gastroenterology.  Final Clinical Impressions(s) / ED Diagnoses   Final diagnoses:  Gastroesophageal reflux disease with esophagitis    New Prescriptions Discharge Medication List as of 12/31/2016  4:01 PM    START taking these medications   Details  alum & mag hydroxide-simeth (MAALOX MAX) 400-400-40 MG/5ML suspension Take 10 mLs by mouth every 6 (six) hours as needed for indigestion., Starting Wed 12/31/2016, Print    omeprazole (PRILOSEC) 20 MG capsule Take 1 capsule (20 mg total) by mouth daily., Starting Wed 12/31/2016, [employer],  Gaylynn Seiple Lyn, MD 01/01/17 1506

## 2016-12-31 NOTE — ED Notes (Signed)
The pt just moved  Into an apartment and he has difficulty breathing while he is in the apartment and he is ok when hes not there.   He was here earlier and left without being seen.  He has 3 cats and he put them outside today  He thinks it may be the ammonia in the liter box.  No distress at present

## 2016-12-31 NOTE — Discharge Instructions (Signed)
We think this is likely indigestion. Please use Maalox to help with your symptoms. Please take omeprazole every day to help decrease the amount of acid your stomach produces. Please follow up with gastrentology as needed. Please return with any concerns.

## 2016-12-31 NOTE — ED Notes (Signed)
Patient transported to X-ray 

## 2017-01-31 ENCOUNTER — Emergency Department: Payer: PRIVATE HEALTH INSURANCE

## 2017-01-31 ENCOUNTER — Emergency Department
Admission: EM | Admit: 2017-01-31 | Discharge: 2017-02-01 | Disposition: A | Discharge status: Home / Self Care | Payer: PRIVATE HEALTH INSURANCE | Attending: Emergency Medicine | Admitting: Emergency Medicine

## 2017-01-31 ENCOUNTER — Encounter: Payer: Self-pay | Admitting: Emergency Medicine

## 2017-01-31 DIAGNOSIS — W108XXA Fall (on) (from) other stairs and steps, initial encounter: Secondary | ICD-10-CM | POA: Insufficient documentation

## 2017-01-31 DIAGNOSIS — Z79899 Other long term (current) drug therapy: Secondary | ICD-10-CM | POA: Diagnosis not present

## 2017-01-31 DIAGNOSIS — S7001XA Contusion of right hip, initial encounter: Secondary | ICD-10-CM

## 2017-01-31 DIAGNOSIS — Y999 Unspecified external cause status: Secondary | ICD-10-CM | POA: Insufficient documentation

## 2017-01-31 DIAGNOSIS — M1611 Unilateral primary osteoarthritis, right hip: Secondary | ICD-10-CM | POA: Diagnosis not present

## 2017-01-31 DIAGNOSIS — I10 Essential (primary) hypertension: Secondary | ICD-10-CM | POA: Diagnosis not present

## 2017-01-31 DIAGNOSIS — Y9301 Activity, walking, marching and hiking: Secondary | ICD-10-CM | POA: Insufficient documentation

## 2017-01-31 DIAGNOSIS — F17210 Nicotine dependence, cigarettes, uncomplicated: Secondary | ICD-10-CM | POA: Diagnosis not present

## 2017-01-31 DIAGNOSIS — S79911A Unspecified injury of right hip, initial encounter: Secondary | ICD-10-CM | POA: Diagnosis present

## 2017-01-31 DIAGNOSIS — W19XXXA Unspecified fall, initial encounter: Secondary | ICD-10-CM

## 2017-01-31 DIAGNOSIS — Y929 Unspecified place or not applicable: Secondary | ICD-10-CM | POA: Insufficient documentation

## 2017-01-31 MED ORDER — FENTANYL CITRATE (PF) 100 MCG/2ML IJ SOLN
INTRAMUSCULAR | Status: AC
  Administered 2017-01-31: 50 ug via NASAL
  Filled 2017-01-31: qty 2

## 2017-01-31 MED ORDER — FENTANYL CITRATE (PF) 100 MCG/2ML IJ SOLN
50.0000 ug | INTRAMUSCULAR | Status: DC | PRN
  Administered 2017-01-31: 50 ug via NASAL

## 2017-01-31 NOTE — ED Provider Notes (Signed)
[facility] Provider Note   CSN: [id number] Arrival date & time: 01/31/17  2104     History   Chief Complaint Chief Complaint  Patient presents with  . Fall    HPI [name] is a 56 y.o. female presents to the emergency department for evaluation of right groin pain. Patient states around 1 PM today, she was walking down her steps when she missed a step, landed on her right lateral hip while at the same time twisting her hip. She has had severe groin pain since his fall. She denies any other trauma or injury to her body. No headache, neck pain, back pain. Patient was able to stand but with severe groin pain. She denies any buttocks pain. No numbness tingling or radicular symptoms. Patient's pain is 7 out of 10 with lying but 9 out of 10 with standing. HPI  Past Medical History:  Diagnosis Date  . Anxiety   . Depression   . Hyperlipidemia   . Hypertension   . OSA (obstructive sleep apnea) 03/09/2013  . Sleep apnea     Patient Active Problem List   Diagnosis Date Noted  . Morbid obesity ([place]) 02/20/2015  . Chest pain, unspecified 01/13/2014  . Chest pain 01/13/2014  . Abnormal EKG- inferior TWI 01/13/2014  . OSA (obstructive sleep apnea) 03/09/2013    Past Surgical History:  Procedure Laterality Date  . BACK SURGERY    . BREATH TEK H PYLORI N/A 08/31/2014   Procedure: BREATH TEK H PYLORI;  Surgeon: Alphonsa Overall, MD;  Location: [facility];  Service: General;  Laterality: N/A;  . LAPAROSCOPIC GASTRIC SLEEVE RESECTION N/A 02/20/2015   Procedure: LAPAROSCOPIC GASTRIC SLEEVE RESECTION;  Surgeon: Alphonsa Overall, MD;  Location: [facility];  Service: General;  Laterality: N/A;  . PARTIAL HYSTERECTOMY    . sleep study  04/08/13  . THYROID SURGERY      OB History    No data available       Home Medications    Prior to Admission medications   Medication Sig Start Date End Date Taking? Authorizing Provider  alum & mag hydroxide-simeth (MAALOX MAX)  400-400-40 MG/5ML suspension Take 10 mLs by mouth every 6 (six) hours as needed for indigestion. 12/31/16   Mackuen, Courteney Lyn, MD  amoxicillin (AMOXIL) 500 MG capsule Take 2 capsules (1,000 mg total) by mouth 3 (three) times daily. Patient not taking: Reported on 12/31/2016 03/21/15   Delsa Grana, PA-C  aspirin EC 81 MG tablet Take 1 tablet (81 mg total) by mouth daily. Patient not taking: Reported on 02/09/2015 01/13/14   Kelvin Cellar, MD  Cholecalciferol (VITAMIN D-3) 5000 UNITS TABS Take 1 tablet by mouth every morning.     [provider]  ibuprofen (ADVIL,MOTRIN) 200 MG tablet Take 200 mg by mouth every 6 (six) hours as needed for moderate pain.    [provider]  omeprazole (PRILOSEC) 20 MG capsule Take 1 capsule (20 mg total) by mouth daily. 12/31/16   Mackuen, Courteney Lyn, MD  ondansetron (ZOFRAN ODT) 4 MG disintegrating tablet Take 1 tablet (4 mg total) by mouth every 8 (eight) hours as needed for nausea or vomiting. Patient not taking: Reported on 02/09/2015 03/22/14   Street, [place], PA-C  oxyCODONE (ROXICODONE) 5 MG immediate release tablet Take 1 tablet (5 mg total) by mouth every 8 (eight) hours as needed. 02/01/17 02/01/18  Duanne Guess, PA-C  oxyCODONE-acetaminophen (PERCOCET) 5-325 MG per tablet Take 1 tablet by mouth every 6 (six) hours as  needed for severe pain. Patient not taking: Reported on 02/09/2015 03/22/14   Street, [place], PA-C  tamsulosin (FLOMAX) 0.4 MG CAPS capsule Take 1 capsule (0.4 mg total) by mouth daily after supper. Take until stone has passed Patient not taking: Reported on 02/09/2015 03/22/14   Street, [place], PA-C  venlafaxine XR (EFFEXOR-XR) 75 MG 24 hr capsule Take 75 mg by mouth daily. 12/11/16   [provider]    Family History Family History  Problem Relation Age of Onset  . COPD Mother   . Hypertension Mother   . Heart disease Mother   . Heart attack Father   . Heart disease Father   . Hypertension Sister   .  Hypertension Brother   . Colon cancer Neg Hx     Social History Social History  Substance Use Topics  . Smoking status: Current Every Day Smoker    Packs/day: 0.50    Types: Cigarettes  . Smokeless tobacco: Never Used  . Alcohol use No     Allergies   Hydrocodone and Prednisone   Review of Systems Review of Systems  Constitutional: Negative for activity change.  Eyes: Negative for pain and visual disturbance.  Respiratory: Negative for shortness of breath.   Cardiovascular: Negative for chest pain and leg swelling.  Gastrointestinal: Negative for abdominal pain.  Genitourinary: Negative for flank pain and pelvic pain.  Musculoskeletal: Positive for arthralgias (right groin pain) and gait problem (ble to ambulate but with a significant limp.). Negative for back pain, joint swelling, myalgias, neck pain and neck stiffness.  Skin: Negative for wound.  Neurological: Negative for dizziness, syncope, weakness, light-headedness, numbness and headaches.  Psychiatric/Behavioral: Negative for confusion and decreased concentration.     Physical Exam Updated Vital Signs BP (!) 151/87 (BP Location: Right Arm)   Pulse (!) 57   Temp 98.6 F (37 C) (Oral)   Resp 16   Ht 5\' 3"  (1.6 m)   Wt 89.8 kg (198 lb)   SpO2 100%   BMI 35.07 kg/m   Physical Exam  Constitutional: She is oriented to person, place, and time. She appears well-developed and well-nourished. No distress.  HENT:  Head: Normocephalic and atraumatic.  Right Ear: External ear normal.  Left Ear: External ear normal.  Nose: Nose normal.  Eyes: Pupils are equal, round, and reactive to light. Conjunctivae and EOM are normal.  Neck: Normal range of motion.  Cardiovascular: Normal rate.   Pulmonary/Chest: Effort normal and breath sounds normal. No respiratory distress.  Abdominal: Soft. There is no tenderness.  Musculoskeletal: She exhibits tenderness. She exhibits no edema or deformity.  Examination of the cervical  thoracic and lumbar spine shows no spinous process tenderness. Patient is tender along the right hip trochanteric bursa, has severe groin pain with swallowing rolling as well as right hip internal rotation. She is nontender along the pelvis to palpation. Leg lengths were equal. No significant soft tissue swelling. No skin breakdown noted.   Neurological: She is alert and oriented to person, place, and time. No cranial nerve deficit. Coordination normal.  Skin: Skin is warm and dry. No rash noted.  Psychiatric: She has a normal mood and affect. Her behavior is normal. Judgment normal.     ED Treatments / Results  Labs (all labs ordered are listed, but only abnormal results are displayed) Labs Reviewed - No data to display  EKG  EKG Interpretation None       Radiology Ct Hip Right Wo Contrast  Result Date: 02/01/2017 CLINICAL DATA:  Right upper leg and groin pain. EXAM: CT OF THE RIGHT HIP WITHOUT CONTRAST TECHNIQUE: Multidetector CT imaging of the right hip was performed according to the standard protocol. Multiplanar CT image reconstructions were also generated. COMPARISON:  Same day radiographs of the right hip and pelvis. FINDINGS: Bones/Joint/Cartilage There are nonspecific tiny ossific densities measuring up to 6 mm in maximum length off the anterior superior acetabular roof more commonly associated with accessory acetabular ossicles. The possibility of tiny fractures off the acetabular rim is not entirely excluded given mechanism of injury. No joint effusion is noted of the right hip. There is degenerative joint space narrowing of the right hip with mild bony protuberance along the superolateral femoral head - neck junction that may reflect stigmata of femoroacetabular impingement. No acute displaced fracture of the proximal femur or joint dislocation. Ligaments Suboptimally assessed by CT. Muscles and Tendons No intramuscular hemorrhage, edema or mass. Soft tissues No subcutaneous  hematoma or significant soft tissue induration. Surgical clips are seen in the pelvis. IMPRESSION: 1. There are two small ossific densities off the anterior superior acetabular roof more commonly associated with small acetabular ossicles. The possibility of tiny nondisplaced acetabular rim fractures anterosuperiorly are not entirely excluded measuring up to 6 mm maximum. If necessary, MRI to evaluate for marrow edema adjacent to these ossifications may help determine whether these represent small ossicles versus tiny fractures. 2. 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 This can predispose the patient to labral degeneration and/or tears. 3. No soft tissue mass or hematoma. Electronically Signed   By: Ashley Royalty M.D.   On: 02/01/2017 00:10   Dg Hip Unilat  With Pelvis 2-3 Views Right  Result Date: 01/31/2017 CLINICAL DATA:  Right groin pain after slip on porch and this evening. EXAM: DG HIP (WITH OR WITHOUT PELVIS) 2-3V RIGHT COMPARISON:  None. FINDINGS: There is no evidence of hip fracture or dislocation. Mild joint space narrowing of both hips left slightly worse than right with acetabular spurring on the right. Left-sided pedicle screws are seen spanning L4-5 with interbody block in place. Two surgical clips are seen in the right hemipelvis numerous phleboliths the lower pelvis bilaterally. There is no evidence of arthropathy or other focal bone abnormality. IMPRESSION: Osteoarthritis of the hips.  No acute hip or pelvic fracture. Electronically Signed   By: Ashley Royalty M.D.   On: 01/31/2017 22:11    Procedures Procedures (including critical care time)  Medications Ordered in ED Medications  fentaNYL (SUBLIMAZE) injection 50 mcg (50 mcg Nasal Given 01/31/17 2139)  oxyCODONE (Oxy IR/ROXICODONE) immediate release tablet 5 mg (not administered)     Initial Impression / Assessment and Plan / ED Course  I  have reviewed the triage vital signs and the nursing notes.  Pertinent labs & imaging results that were available during my care of the patient were reviewed by me and considered in my medical decision making (see chart for details).     56 year old female with fall and right hip pain. CT of her head negative for acute proximal femur fracture. Patient with significant arthritic changes throughout right hip. Patient will use home walker for ambulation. She will take Tylenol as needed for mild-to-moderate pain and oxycodone as needed for severe pain. She will follow-up with orthopedics. Return to ER for any worsening symptoms or changes in her health.  Final Clinical Impressions(s) / ED Diagnoses   Final diagnoses:  Fall, initial encounter  Contusion  of right hip, initial encounter  Primary osteoarthritis of right hip    New Prescriptions New Prescriptions   OXYCODONE (ROXICODONE) 5 MG IMMEDIATE RELEASE TABLET    Take 1 tablet (5 mg total) by mouth every 8 (eight) hours as needed.     Duanne Guess, PA-C 02/01/17 0023    Earleen Newport, MD 02/02/17 [phone]

## 2017-01-31 NOTE — ED Triage Notes (Signed)
Patient states that she tripped and fell. Patient with complaint of right upper leg and groin pain.

## 2017-01-31 NOTE — ED Notes (Signed)
Patient transported to X-ray 

## 2017-02-01 MED ORDER — OXYCODONE HCL 5 MG PO TABS: 5.0000 mg | ORAL_TABLET | Freq: Three times a day (TID) | ORAL | 0 refills | Status: DC | PRN

## 2017-02-01 MED ORDER — OXYCODONE HCL 5 MG PO TABS
ORAL_TABLET | ORAL | Status: AC
  Filled 2017-02-01: qty 1

## 2017-02-01 MED ORDER — OXYCODONE HCL 5 MG PO TABS
5.0000 mg | ORAL_TABLET | Freq: Once | ORAL | Status: AC
  Administered 2017-02-01: 5 mg via ORAL

## 2017-02-01 NOTE — Discharge Instructions (Signed)
Please apply ice to the right hip 20 minutes every hour for the next 2-3 days. Use walker as needed for ambulation. Tylenol as needed for mild-to-moderate pain. Oxycodone as needed for severe pain. Please call orthopedics scheduled follow-up appointment. Return to the emergency department for any worsening symptoms or changes in health.

## 2017-02-13 ENCOUNTER — Other Ambulatory Visit: Payer: Self-pay | Admitting: Orthopedic Surgery

## 2017-02-23 ENCOUNTER — Other Ambulatory Visit (HOSPITAL_COMMUNITY): Payer: Self-pay | Admitting: Emergency Medicine

## 2017-02-23 NOTE — Patient Instructions (Addendum)
[name]  02/23/2017   Your procedure is scheduled on: 02-27-17   Report to [facility] Main  Entrance Take [place]  elevators to 3rd floor to  [place] [facility] [place].   Call this number if you have problems the morning of surgery [phone]    Remember: ONLY 1 PERSON MAY GO WITH YOU TO SHORT STAY TO GET  READY MORNING OF YOUR SURGERY.  Do not eat food After Midnight.you may have clear liquids from midnight until 730am day of surgery. Nothing by mouth after 730am!     Take these medicines the morning of surgery with A SIP OF WATER: tylenol as needed, amlodipine(norvasc), esomeprazole(nexium), percocet if needed, venlafaxine(effexor)                                 You may not have any metal on your body including hair pins and              piercings  Do not wear jewelry, make-up, lotions, powders or perfumes, deodorant             Do not wear nail polish.  Do not shave  48 hours prior to surgery.     Do not bring valuables to the hospital. [place].  Contacts, dentures or bridgework may not be worn into surgery.  Leave suitcase in the car. After surgery it may be brought to your room.               Please read over the following fact sheets you were given: _____________________________________________________________________  CLEAR LIQUID DIET   Foods Allowed                                                                     Foods Excluded  Coffee and tea, regular and decaf                             liquids that you cannot  Plain Jell-O in any flavor                                             see through such as: Fruit ices (not with fruit pulp)                                     milk, soups, orange juice  Iced Popsicles                                    All solid food Carbonated beverages, regular and diet  Cranberry, grape and apple juices Sports drinks  like Gatorade Lightly seasoned clear broth or consume(fat free) Sugar, honey syrup  Sample Menu Breakfast                                Lunch                                     Supper Cranberry juice                    Beef broth                            Chicken broth Jell-O                                     Grape juice                           Apple juice Coffee or tea                        Jell-O                                      Popsicle                                                Coffee or tea                        Coffee or tea  _____________________________________________________________________  [facility] - Preparing for Surgery Before surgery, you can play an important role.  Because skin is not sterile, your skin needs to be as free of germs as possible.  You can reduce the number of germs on your skin by washing with CHG (chlorahexidine gluconate) soap before surgery.  CHG is an antiseptic cleaner which kills germs and bonds with the skin to continue killing germs even after washing. Please DO NOT use if you have an allergy to CHG or antibacterial soaps.  If your skin becomes reddened/irritated stop using the CHG and inform your nurse when you arrive at Short Stay. Do not shave (including legs and underarms) for at least 48 hours prior to the first CHG shower.  You may shave your face/neck. Please follow these instructions carefully:  1.  Shower with CHG Soap the night before surgery and the  morning of Surgery.  2.  If you choose to wash your hair, wash your hair first as usual with your  normal  shampoo.  3.  After you shampoo, rinse your hair and body thoroughly to remove the  shampoo.                           4.  Use CHG as you would any other liquid soap.  You can apply chg directly  to the skin and wash  Gently with a scrungie or clean washcloth.  5.  Apply the CHG Soap to your body ONLY FROM THE NECK DOWN.   Do not use on face/ open                            Wound or open sores. Avoid contact with eyes, ears mouth and genitals (private parts).                       Wash face,  Genitals (private parts) with your normal soap.             6.  Wash thoroughly, paying special attention to the area where your surgery  will be performed.  7.  Thoroughly rinse your body with warm water from the neck down.  8.  DO NOT shower/wash with your normal soap after using and rinsing off  the CHG Soap.                9.  Pat yourself dry with a clean towel.            10.  Wear clean pajamas.            11.  Place clean sheets on your bed the night of your first shower and do not  sleep with pets. Day of Surgery : Do not apply any lotions/deodorants the morning of surgery.  Please wear clean clothes to the hospital/surgery center.  FAILURE TO FOLLOW THESE INSTRUCTIONS MAY RESULT IN THE CANCELLATION OF YOUR SURGERY PATIENT SIGNATURE_________________________________  NURSE SIGNATURE__________________________________  ________________________________________________________________________   Adam Phenix  An incentive spirometer is a tool that can help keep your lungs clear and active. This tool measures how well you are filling your lungs with each breath. Taking long deep breaths may help reverse or decrease the chance of developing breathing (pulmonary) problems (especially infection) following:  A long period of time when you are unable to move or be active. BEFORE THE PROCEDURE   If the spirometer includes an indicator to show your best effort, your nurse or respiratory therapist will set it to a desired goal.  If possible, sit up straight or lean slightly forward. Try not to slouch.  Hold the incentive spirometer in an upright position. INSTRUCTIONS FOR USE  1. Sit on the edge of your bed if possible, or sit up as far as you can in bed or on a chair. 2. Hold the incentive spirometer in an upright position. 3. Breathe out  normally. 4. Place the mouthpiece in your mouth and seal your lips tightly around it. 5. Breathe in slowly and as deeply as possible, raising the piston or the ball toward the top of the column. 6. Hold your breath for 3-5 seconds or for as long as possible. Allow the piston or ball to fall to the bottom of the column. 7. Remove the mouthpiece from your mouth and breathe out normally. 8. Rest for a few seconds and repeat Steps 1 through 7 at least 10 times every 1-2 hours when you are awake. Take your time and take a few normal breaths between deep breaths. 9. The spirometer may include an indicator to show your best effort. Use the indicator as a goal to work toward during each repetition. 10. After each set of 10 deep breaths, practice coughing to be sure your lungs are clear. If you have an incision (the cut made at the time of  surgery), support your incision when coughing by placing a pillow or rolled up towels firmly against it. Once you are able to get out of bed, walk around indoors and cough well. You may stop using the incentive spirometer when instructed by your caregiver.  RISKS AND COMPLICATIONS  Take your time so you do not get dizzy or light-headed.  If you are in pain, you may need to take or ask for pain medication before doing incentive spirometry. It is harder to take a deep breath if you are having pain. AFTER USE  Rest and breathe slowly and easily.  It can be helpful to keep track of a log of your progress. Your caregiver can provide you with a simple table to help with this. If you are using the spirometer at home, follow these instructions: [place] IF:   You are having difficultly using the spirometer.  You have trouble using the spirometer as often as instructed.  Your pain medication is not giving enough relief while using the spirometer.  You develop fever of 100.5 F (38.1 C) or higher. SEEK IMMEDIATE MEDICAL CARE IF:   You cough up bloody sputum  that had not been present before.  You develop fever of 102 F (38.9 C) or greater.  You develop worsening pain at or near the incision site. MAKE SURE YOU:   Understand these instructions.  Will watch your condition.  Will get help right away if you are not doing well or get worse. Document Released: 09/15/2006 Document Revised: 07/28/2011 Document Reviewed: 11/16/2006 [facility] Patient Information 2014 [place], [place].   ________________________________________________________________________

## 2017-02-23 NOTE — Progress Notes (Signed)
EKG 12-31-16 epic  CT chest 12-31-16 epic  ECHO 12-20-15 epic

## 2017-02-24 ENCOUNTER — Encounter (HOSPITAL_COMMUNITY): Payer: Self-pay

## 2017-02-24 ENCOUNTER — Encounter (HOSPITAL_COMMUNITY)
Admission: RE | Admit: 2017-02-24 | Discharge: 2017-02-24 | Disposition: A | Discharge status: Home / Self Care | Payer: PRIVATE HEALTH INSURANCE | Source: Ambulatory Visit | Attending: Orthopedic Surgery | Admitting: Orthopedic Surgery

## 2017-02-24 HISTORY — DX: Personal history of urinary calculi: Z87.442

## 2017-02-24 HISTORY — DX: Prediabetes: R73.03

## 2017-02-24 HISTORY — DX: Unspecified open wound of unspecified breast, initial encounter: S21.009A

## 2017-02-24 LAB — COMPREHENSIVE METABOLIC PANEL
ALK PHOS: 101 U/L (ref 38–126)
ALT: 14 U/L (ref 14–54)
ANION GAP: 9 (ref 5–15)
AST: 21 U/L (ref 15–41)
Albumin: 4.1 g/dL (ref 3.5–5.0)
BILIRUBIN TOTAL: 1.2 mg/dL (ref 0.3–1.2)
BUN: 22 mg/dL — AB (ref 6–20)
CHLORIDE: 110 mmol/L (ref 101–111)
CO2: 26 mmol/L (ref 22–32)
Calcium: 9 mg/dL (ref 8.9–10.3)
Creatinine, Ser: 0.86 mg/dL (ref 0.44–1.00)
Glucose, Bld: 95 mg/dL (ref 65–99)
POTASSIUM: 5.3 mmol/L — AB (ref 3.5–5.1)
Sodium: 145 mmol/L (ref 135–145)
TOTAL PROTEIN: 7.1 g/dL (ref 6.5–8.1)

## 2017-02-24 LAB — URINALYSIS, ROUTINE W REFLEX MICROSCOPIC
BILIRUBIN URINE: NEGATIVE
GLUCOSE, UA: NEGATIVE mg/dL
Hgb urine dipstick: NEGATIVE
Ketones, ur: NEGATIVE mg/dL
Leukocytes, UA: NEGATIVE
NITRITE: NEGATIVE
PH: 7.5 (ref 5.0–8.0)
Protein, ur: NEGATIVE mg/dL
SPECIFIC GRAVITY, URINE: 1.02 (ref 1.005–1.030)

## 2017-02-24 LAB — CBC WITH DIFFERENTIAL/PLATELET
Basophils Absolute: 0 10*3/uL (ref 0.0–0.1)
Basophils Relative: 1 %
EOS PCT: 2 %
Eosinophils Absolute: 0.1 10*3/uL (ref 0.0–0.7)
HCT: 44.8 % (ref 36.0–46.0)
Hemoglobin: 14.5 g/dL (ref 12.0–15.0)
LYMPHS ABS: 1.8 10*3/uL (ref 0.7–4.0)
LYMPHS PCT: 29 %
MCH: 29.7 pg (ref 26.0–34.0)
MCHC: 32.4 g/dL (ref 30.0–36.0)
MCV: 91.6 fL (ref 78.0–100.0)
MONO ABS: 0.3 10*3/uL (ref 0.1–1.0)
MONOS PCT: 5 %
Neutro Abs: 4 10*3/uL (ref 1.7–7.7)
Neutrophils Relative %: 63 %
PLATELETS: 233 10*3/uL (ref 150–400)
RBC: 4.89 MIL/uL (ref 3.87–5.11)
RDW: 13 % (ref 11.5–15.5)
WBC: 6.2 10*3/uL (ref 4.0–10.5)

## 2017-02-24 LAB — APTT: aPTT: 33 seconds (ref 24–36)

## 2017-02-24 LAB — HEMOGLOBIN A1C
Hgb A1c MFr Bld: 5.4 % (ref 4.8–5.6)
MEAN PLASMA GLUCOSE: 108.28 mg/dL

## 2017-02-24 LAB — PROTIME-INR
INR: 0.87
PROTHROMBIN TIME: 11.7 s (ref 11.4–15.2)

## 2017-02-24 LAB — SURGICAL PCR SCREEN
MRSA, PCR: NEGATIVE
STAPHYLOCOCCUS AUREUS: POSITIVE — AB

## 2017-02-24 LAB — ABO/RH: ABO/RH(D): O POS

## 2017-02-27 ENCOUNTER — Inpatient Hospital Stay (HOSPITAL_COMMUNITY): Payer: PRIVATE HEALTH INSURANCE | Admitting: Anesthesiology

## 2017-02-27 ENCOUNTER — Encounter (HOSPITAL_COMMUNITY)
Admission: RE | Disposition: A | Discharge status: Home / Self Care | Payer: Self-pay | Source: Ambulatory Visit | Attending: Orthopedic Surgery

## 2017-02-27 ENCOUNTER — Inpatient Hospital Stay (HOSPITAL_COMMUNITY): Payer: PRIVATE HEALTH INSURANCE

## 2017-02-27 ENCOUNTER — Inpatient Hospital Stay (HOSPITAL_COMMUNITY)
Admission: RE | Admit: 2017-02-27 | Discharge: 2017-03-01 | DRG: 470 | Disposition: A | Discharge status: Home / Self Care | Payer: PRIVATE HEALTH INSURANCE | Source: Ambulatory Visit | Attending: Orthopedic Surgery | Admitting: Orthopedic Surgery

## 2017-02-27 ENCOUNTER — Encounter (HOSPITAL_COMMUNITY): Payer: Self-pay | Admitting: *Deleted

## 2017-02-27 DIAGNOSIS — I10 Essential (primary) hypertension: Secondary | ICD-10-CM | POA: Diagnosis present

## 2017-02-27 DIAGNOSIS — Z90711 Acquired absence of uterus with remaining cervical stump: Secondary | ICD-10-CM

## 2017-02-27 DIAGNOSIS — Z6835 Body mass index (BMI) 35.0-35.9, adult: Secondary | ICD-10-CM | POA: Diagnosis not present

## 2017-02-27 DIAGNOSIS — Z8249 Family history of ischemic heart disease and other diseases of the circulatory system: Secondary | ICD-10-CM

## 2017-02-27 DIAGNOSIS — G4733 Obstructive sleep apnea (adult) (pediatric): Secondary | ICD-10-CM | POA: Diagnosis present

## 2017-02-27 DIAGNOSIS — M1611 Unilateral primary osteoarthritis, right hip: Secondary | ICD-10-CM | POA: Diagnosis present

## 2017-02-27 DIAGNOSIS — Z9889 Other specified postprocedural states: Secondary | ICD-10-CM

## 2017-02-27 DIAGNOSIS — Z888 Allergy status to other drugs, medicaments and biological substances status: Secondary | ICD-10-CM | POA: Diagnosis not present

## 2017-02-27 DIAGNOSIS — Z87442 Personal history of urinary calculi: Secondary | ICD-10-CM

## 2017-02-27 DIAGNOSIS — F17210 Nicotine dependence, cigarettes, uncomplicated: Secondary | ICD-10-CM | POA: Diagnosis present

## 2017-02-27 DIAGNOSIS — M25551 Pain in right hip: Secondary | ICD-10-CM

## 2017-02-27 DIAGNOSIS — Z825 Family history of asthma and other chronic lower respiratory diseases: Secondary | ICD-10-CM | POA: Diagnosis not present

## 2017-02-27 HISTORY — PX: TOTAL HIP ARTHROPLASTY: SHX124

## 2017-02-27 LAB — TYPE AND SCREEN
ABO/RH(D): O POS
ANTIBODY SCREEN: NEGATIVE

## 2017-02-27 SURGERY — ARTHROPLASTY, HIP, TOTAL, ANTERIOR APPROACH
Anesthesia: Spinal | Site: Hip | Laterality: Right

## 2017-02-27 MED ORDER — DEXAMETHASONE SODIUM PHOSPHATE 10 MG/ML IJ SOLN
INTRAMUSCULAR | Status: AC
  Filled 2017-02-27: qty 1

## 2017-02-27 MED ORDER — VENLAFAXINE HCL ER 75 MG PO CP24
75.0000 mg | ORAL_CAPSULE | Freq: Every day | ORAL | Status: DC
  Administered 2017-02-28 – 2017-03-01 (×2): 75 mg via ORAL
  Filled 2017-02-27 (×2): qty 1

## 2017-02-27 MED ORDER — HYDROMORPHONE HCL-NACL 0.5-0.9 MG/ML-% IV SOSY
PREFILLED_SYRINGE | INTRAVENOUS | Status: AC
  Filled 2017-02-27: qty 1

## 2017-02-27 MED ORDER — MIDAZOLAM HCL 2 MG/2ML IJ SOLN
INTRAMUSCULAR | Status: AC
  Filled 2017-02-27: qty 2

## 2017-02-27 MED ORDER — PROPOFOL 10 MG/ML IV BOLUS
INTRAVENOUS | Status: AC
  Filled 2017-02-27: qty 20

## 2017-02-27 MED ORDER — EPHEDRINE SULFATE-NACL 50-0.9 MG/10ML-% IV SOSY
PREFILLED_SYRINGE | INTRAVENOUS | Status: DC | PRN
  Administered 2017-02-27 (×2): 10 mg via INTRAVENOUS
  Administered 2017-02-27: 5 mg via INTRAVENOUS
  Administered 2017-02-27: 10 mg via INTRAVENOUS

## 2017-02-27 MED ORDER — OXYCODONE-ACETAMINOPHEN 5-325 MG PO TABS: 1.0000 | ORAL_TABLET | Freq: Four times a day (QID) | ORAL | 0 refills | Status: DC | PRN

## 2017-02-27 MED ORDER — ASPIRIN EC 325 MG PO TBEC
325.0000 mg | DELAYED_RELEASE_TABLET | Freq: Two times a day (BID) | ORAL | Status: DC
  Administered 2017-02-28 – 2017-03-01 (×3): 325 mg via ORAL
  Filled 2017-02-27 (×3): qty 1

## 2017-02-27 MED ORDER — GABAPENTIN 300 MG PO CAPS
300.0000 mg | ORAL_CAPSULE | Freq: Two times a day (BID) | ORAL | Status: DC
  Administered 2017-02-27 – 2017-03-01 (×4): 300 mg via ORAL
  Filled 2017-02-27 (×5): qty 1

## 2017-02-27 MED ORDER — HYDROMORPHONE HCL-NACL 0.5-0.9 MG/ML-% IV SOSY
0.5000 mg | PREFILLED_SYRINGE | INTRAVENOUS | Status: DC | PRN
  Administered 2017-02-27 – 2017-02-28 (×2): 1 mg via INTRAVENOUS
  Administered 2017-02-28 – 2017-03-01 (×2): 0.5 mg via INTRAVENOUS
  Administered 2017-03-01 (×2): 1 mg via INTRAVENOUS
  Filled 2017-02-27: qty 1
  Filled 2017-02-27: qty 2
  Filled 2017-02-27: qty 1
  Filled 2017-02-27 (×3): qty 2

## 2017-02-27 MED ORDER — OXYCODONE HCL 5 MG PO TABS
5.0000 mg | ORAL_TABLET | ORAL | Status: DC | PRN
  Administered 2017-02-27 – 2017-03-01 (×11): 10 mg via ORAL
  Filled 2017-02-27 (×12): qty 2

## 2017-02-27 MED ORDER — DEXAMETHASONE SODIUM PHOSPHATE 10 MG/ML IJ SOLN
INTRAMUSCULAR | Status: DC | PRN
  Administered 2017-02-27: 10 mg via INTRAVENOUS

## 2017-02-27 MED ORDER — PHENYLEPHRINE 40 MCG/ML (10ML) SYRINGE FOR IV PUSH (FOR BLOOD PRESSURE SUPPORT)
PREFILLED_SYRINGE | INTRAVENOUS | Status: DC | PRN
  Administered 2017-02-27 (×5): 80 ug via INTRAVENOUS

## 2017-02-27 MED ORDER — ASPIRIN EC 325 MG PO TBEC: 325.0000 mg | DELAYED_RELEASE_TABLET | Freq: Two times a day (BID) | ORAL | 0 refills | Status: DC

## 2017-02-27 MED ORDER — FENTANYL CITRATE (PF) 100 MCG/2ML IJ SOLN
INTRAMUSCULAR | Status: DC | PRN
  Administered 2017-02-27: 100 ug via INTRAVENOUS

## 2017-02-27 MED ORDER — TRANEXAMIC ACID 1000 MG/10ML IV SOLN
1000.0000 mg | Freq: Once | INTRAVENOUS | Status: AC
  Administered 2017-02-27: 1000 mg via INTRAVENOUS
  Filled 2017-02-27: qty 1100

## 2017-02-27 MED ORDER — ONDANSETRON HCL 4 MG PO TABS: 4.0000 mg | ORAL_TABLET | Freq: Four times a day (QID) | ORAL | Status: DC | PRN

## 2017-02-27 MED ORDER — BISACODYL 5 MG PO TBEC: 5.0000 mg | DELAYED_RELEASE_TABLET | Freq: Every day | ORAL | Status: DC | PRN

## 2017-02-27 MED ORDER — AMLODIPINE BESYLATE 5 MG PO TABS
5.0000 mg | ORAL_TABLET | Freq: Every day | ORAL | Status: DC
  Administered 2017-02-28 – 2017-03-01 (×2): 5 mg via ORAL
  Filled 2017-02-27 (×2): qty 1

## 2017-02-27 MED ORDER — BUPIVACAINE LIPOSOME 1.3 % IJ SUSP
20.0000 mL | Freq: Once | INTRAMUSCULAR | Status: AC
  Filled 2017-02-27: qty 20

## 2017-02-27 MED ORDER — EPHEDRINE 5 MG/ML INJ
INTRAVENOUS | Status: AC
  Filled 2017-02-27: qty 10

## 2017-02-27 MED ORDER — FENTANYL CITRATE (PF) 100 MCG/2ML IJ SOLN
INTRAMUSCULAR | Status: AC
  Filled 2017-02-27: qty 2

## 2017-02-27 MED ORDER — BUPIVACAINE HCL (PF) 0.25 % IJ SOLN
INTRAMUSCULAR | Status: DC | PRN
  Administered 2017-02-27: 30 mL

## 2017-02-27 MED ORDER — ONDANSETRON HCL 4 MG/2ML IJ SOLN
INTRAMUSCULAR | Status: DC | PRN
  Administered 2017-02-27: 4 mg via INTRAVENOUS

## 2017-02-27 MED ORDER — BUPIVACAINE LIPOSOME 1.3 % IJ SUSP
INTRAMUSCULAR | Status: DC | PRN
  Administered 2017-02-27: 20 mL

## 2017-02-27 MED ORDER — TRANEXAMIC ACID 1000 MG/10ML IV SOLN
1000.0000 mg | INTRAVENOUS | Status: AC
  Administered 2017-02-27: 1000 mg via INTRAVENOUS
  Filled 2017-02-27: qty 10

## 2017-02-27 MED ORDER — DOCUSATE SODIUM 100 MG PO CAPS
100.0000 mg | ORAL_CAPSULE | Freq: Two times a day (BID) | ORAL | Status: DC
  Administered 2017-02-27 – 2017-03-01 (×4): 100 mg via ORAL
  Filled 2017-02-27 (×4): qty 1

## 2017-02-27 MED ORDER — LIDOCAINE 2% (20 MG/ML) 5 ML SYRINGE
INTRAMUSCULAR | Status: DC | PRN
  Administered 2017-02-27: 50 mg via INTRAVENOUS

## 2017-02-27 MED ORDER — CHLORHEXIDINE GLUCONATE 4 % EX LIQD: 60.0000 mL | Freq: Once | CUTANEOUS | Status: DC

## 2017-02-27 MED ORDER — ONDANSETRON HCL 4 MG/2ML IJ SOLN
INTRAMUSCULAR | Status: AC
  Filled 2017-02-27: qty 2

## 2017-02-27 MED ORDER — DIPHENHYDRAMINE HCL 12.5 MG/5ML PO ELIX: 12.5000 mg | ORAL_SOLUTION | ORAL | Status: DC | PRN

## 2017-02-27 MED ORDER — HYDROMORPHONE HCL-NACL 0.5-0.9 MG/ML-% IV SOSY
0.2500 mg | PREFILLED_SYRINGE | INTRAVENOUS | Status: DC | PRN
  Administered 2017-02-27 (×3): 0.5 mg via INTRAVENOUS

## 2017-02-27 MED ORDER — ACETAMINOPHEN 650 MG RE SUPP: 650.0000 mg | Freq: Four times a day (QID) | RECTAL | Status: DC | PRN

## 2017-02-27 MED ORDER — MIDAZOLAM HCL 5 MG/5ML IJ SOLN
INTRAMUSCULAR | Status: DC | PRN
  Administered 2017-02-27: 2 mg via INTRAVENOUS

## 2017-02-27 MED ORDER — ACETAMINOPHEN 325 MG PO TABS
650.0000 mg | ORAL_TABLET | Freq: Four times a day (QID) | ORAL | Status: DC | PRN
  Administered 2017-02-28: 11:00:00 650 mg via ORAL
  Filled 2017-02-27: qty 2

## 2017-02-27 MED ORDER — CEFAZOLIN SODIUM-DEXTROSE 2-4 GM/100ML-% IV SOLN
2.0000 g | Freq: Four times a day (QID) | INTRAVENOUS | Status: AC
  Administered 2017-02-27 (×2): 2 g via INTRAVENOUS
  Filled 2017-02-27 (×2): qty 100

## 2017-02-27 MED ORDER — TIZANIDINE HCL 2 MG PO TABS: 2.0000 mg | ORAL_TABLET | Freq: Three times a day (TID) | ORAL | 0 refills | Status: DC | PRN

## 2017-02-27 MED ORDER — ALUM & MAG HYDROXIDE-SIMETH 200-200-20 MG/5ML PO SUSP: 30.0000 mL | ORAL | Status: DC | PRN

## 2017-02-27 MED ORDER — METHOCARBAMOL 500 MG PO TABS
500.0000 mg | ORAL_TABLET | Freq: Four times a day (QID) | ORAL | Status: DC | PRN
  Administered 2017-02-27 – 2017-03-01 (×5): 500 mg via ORAL
  Filled 2017-02-27 (×5): qty 1

## 2017-02-27 MED ORDER — LIDOCAINE 2% (20 MG/ML) 5 ML SYRINGE
INTRAMUSCULAR | Status: AC
  Filled 2017-02-27: qty 5

## 2017-02-27 MED ORDER — METHOCARBAMOL 1000 MG/10ML IJ SOLN
500.0000 mg | Freq: Four times a day (QID) | INTRAVENOUS | Status: DC | PRN
  Administered 2017-02-27: 500 mg via INTRAVENOUS
  Filled 2017-02-27: qty 550

## 2017-02-27 MED ORDER — BUPIVACAINE HCL (PF) 0.25 % IJ SOLN
INTRAMUSCULAR | Status: AC
  Filled 2017-02-27: qty 30

## 2017-02-27 MED ORDER — ZOLPIDEM TARTRATE 5 MG PO TABS: 5.0000 mg | ORAL_TABLET | Freq: Every evening | ORAL | Status: DC | PRN

## 2017-02-27 MED ORDER — LACTATED RINGERS IV SOLN
INTRAVENOUS | Status: DC
  Administered 2017-02-27 (×3): via INTRAVENOUS

## 2017-02-27 MED ORDER — PANTOPRAZOLE SODIUM 40 MG PO TBEC
40.0000 mg | DELAYED_RELEASE_TABLET | Freq: Every day | ORAL | Status: DC
  Administered 2017-02-28 – 2017-03-01 (×2): 40 mg via ORAL
  Filled 2017-02-27 (×3): qty 1

## 2017-02-27 MED ORDER — CELECOXIB 200 MG PO CAPS
200.0000 mg | ORAL_CAPSULE | Freq: Two times a day (BID) | ORAL | Status: DC
  Administered 2017-02-27 – 2017-03-01 (×4): 200 mg via ORAL
  Filled 2017-02-27 (×4): qty 1

## 2017-02-27 MED ORDER — CHLORHEXIDINE GLUCONATE CLOTH 2 % EX PADS
6.0000 | MEDICATED_PAD | Freq: Every day | CUTANEOUS | Status: DC
  Administered 2017-03-01: 6 via TOPICAL

## 2017-02-27 MED ORDER — POLYETHYLENE GLYCOL 3350 17 G PO PACK: 17.0000 g | PACK | Freq: Every day | ORAL | Status: DC | PRN

## 2017-02-27 MED ORDER — MUPIROCIN 2 % EX OINT
1.0000 "application " | TOPICAL_OINTMENT | Freq: Two times a day (BID) | CUTANEOUS | Status: DC
  Administered 2017-02-27 – 2017-03-01 (×4): 1 via NASAL
  Filled 2017-02-27: qty 22

## 2017-02-27 MED ORDER — ONDANSETRON HCL 4 MG/2ML IJ SOLN
4.0000 mg | Freq: Four times a day (QID) | INTRAMUSCULAR | Status: DC | PRN
  Administered 2017-02-27 – 2017-02-28 (×2): 4 mg via INTRAVENOUS
  Filled 2017-02-27 (×2): qty 2

## 2017-02-27 MED ORDER — DOCUSATE SODIUM 100 MG PO CAPS: 100.0000 mg | ORAL_CAPSULE | Freq: Two times a day (BID) | ORAL | 0 refills | Status: DC

## 2017-02-27 MED ORDER — SODIUM CHLORIDE 0.9 % IV SOLN
INTRAVENOUS | Status: DC
  Administered 2017-02-27 – 2017-02-28 (×2): via INTRAVENOUS

## 2017-02-27 MED ORDER — PROPOFOL 500 MG/50ML IV EMUL
INTRAVENOUS | Status: DC | PRN
  Administered 2017-02-27: 100 ug/kg/min via INTRAVENOUS

## 2017-02-27 MED ORDER — MAGNESIUM CITRATE PO SOLN: 1.0000 | Freq: Once | ORAL | Status: DC | PRN

## 2017-02-27 MED ORDER — PHENYLEPHRINE 40 MCG/ML (10ML) SYRINGE FOR IV PUSH (FOR BLOOD PRESSURE SUPPORT)
PREFILLED_SYRINGE | INTRAVENOUS | Status: AC
  Filled 2017-02-27: qty 10

## 2017-02-27 MED ORDER — BUPIVACAINE IN DEXTROSE 0.75-8.25 % IT SOLN
INTRATHECAL | Status: DC | PRN
Start: 2017-02-27 — End: 2017-02-27
  Administered 2017-02-27: 2 mL via INTRATHECAL

## 2017-02-27 MED ORDER — STERILE WATER FOR IRRIGATION IR SOLN
Status: DC | PRN
  Administered 2017-02-27: 2000 mL

## 2017-02-27 MED ORDER — SODIUM CHLORIDE 0.9 % IR SOLN
Status: DC | PRN
  Administered 2017-02-27: 1000 mL

## 2017-02-27 MED ORDER — CEFAZOLIN SODIUM-DEXTROSE 2-4 GM/100ML-% IV SOLN
2.0000 g | INTRAVENOUS | Status: AC
  Administered 2017-02-27: 2 g via INTRAVENOUS
  Filled 2017-02-27: qty 100

## 2017-02-27 SURGICAL SUPPLY — 34 items
BAG ZIPLOCK 12X15 (MISCELLANEOUS) IMPLANT
BENZOIN TINCTURE PRP APPL 2/3 (GAUZE/BANDAGES/DRESSINGS) ×3 IMPLANT
BLADE SAW SGTL 18X1.27X75 (BLADE) ×2 IMPLANT
BLADE SAW SGTL 18X1.27X75MM (BLADE) ×1
BNDG COHESIVE 6X5 TAN STRL LF (GAUZE/BANDAGES/DRESSINGS) IMPLANT
CAPT HIP TOTAL 2 ×3 IMPLANT
CELLS DAT CNTRL 66122 CELL SVR (MISCELLANEOUS) ×1 IMPLANT
CLOSURE WOUND 1/2 X4 (GAUZE/BANDAGES/DRESSINGS) ×1
COVER PERINEAL POST (MISCELLANEOUS) ×3 IMPLANT
COVER SURGICAL LIGHT HANDLE (MISCELLANEOUS) ×3 IMPLANT
DRAPE U-SHAPE 47X51 STRL (DRAPES) ×6 IMPLANT
DRSG AQUACEL AG ADV 3.5X 6 (GAUZE/BANDAGES/DRESSINGS) ×3 IMPLANT
DRSG AQUACEL AG ADV 3.5X10 (GAUZE/BANDAGES/DRESSINGS) ×3 IMPLANT
DURAPREP 26ML APPLICATOR (WOUND CARE) ×3 IMPLANT
ELECT REM PT RETURN 15FT ADLT (MISCELLANEOUS) ×3 IMPLANT
GAUZE XEROFORM 1X8 LF (GAUZE/BANDAGES/DRESSINGS) IMPLANT
GLOVE BIOGEL PI IND STRL 8 (GLOVE) ×2 IMPLANT
GLOVE BIOGEL PI INDICATOR 8 (GLOVE) ×4
GLOVE ECLIPSE 7.5 STRL STRAW (GLOVE) ×6 IMPLANT
GOWN STRL REUS W/TWL XL LVL3 (GOWN DISPOSABLE) ×6 IMPLANT
HOLDER FOLEY CATH W/STRAP (MISCELLANEOUS) ×3 IMPLANT
HOOD PEEL AWAY FLYTE STAYCOOL (MISCELLANEOUS) ×9 IMPLANT
PACK ANTERIOR HIP CUSTOM (KITS) ×3 IMPLANT
RTRCTR WOUND ALEXIS 18CM MED (MISCELLANEOUS) ×3
STAPLER VISISTAT 35W (STAPLE) IMPLANT
STRIP CLOSURE SKIN 1/2X4 (GAUZE/BANDAGES/DRESSINGS) ×2 IMPLANT
SUT ETHIBOND NAB CT1 #1 30IN (SUTURE) ×6 IMPLANT
SUT MNCRL AB 3-0 PS2 18 (SUTURE) IMPLANT
SUT VIC AB 0 CT1 36 (SUTURE) ×3 IMPLANT
SUT VIC AB 1 CT1 36 (SUTURE) ×6 IMPLANT
SUT VIC AB 2-0 CT1 27 (SUTURE) ×4
SUT VIC AB 2-0 CT1 TAPERPNT 27 (SUTURE) ×2 IMPLANT
TRAY FOLEY CATH 14FRSI W/METER (CATHETERS) ×3 IMPLANT
TRAY FOLEY W/METER SILVER 16FR (SET/KITS/TRAYS/PACK) IMPLANT

## 2017-02-27 NOTE — H&P (Addendum)
TOTAL HIP ADMISSION H&P  Patient is admitted for right total hip arthroplasty.  Subjective:  Chief Complaint: right hip pain  HPI: [name], 56 y.o. female, has a history of pain and functional disability in the right hip(s) due to arthritis and patient has failed non-surgical conservative treatments for greater than 12 weeks to include NSAID's and/or analgesics, corticosteriod injections and activity modification.  Onset of symptoms was gradual starting 1 years ago with gradually worsening course since that time.The patient noted no past surgery on the right hip(s).  Patient currently rates pain in the right hip at 10 out of 10 with activity. Patient has night pain, worsening of pain with activity and weight bearing, trendelenberg gait, pain that interfers with activities of daily living, pain with passive range of motion and active motion. Patient has evidence of minimal degenerative arthritis by imaging studies. The patient underwent intra-articular hip injection with essentially 100% relief of pain. She had had prolonged workup with physical exam completely indicative of arthritis of the hip but radiographic findings not dramatic.After prolonged workup we did intra-articular hip injection which gave her complete relief of pain and after thorough discussion she has decided to undergo hip replacement because of the severity of pain and failure of all conservative careThis condition presents safety issues increasing the risk of falls. This patient has had failure of all reasonable conservative care.  There is no current active infection.  Patient Active Problem List   Diagnosis Date Noted  . Morbid obesity ([place]) 02/20/2015  . Chest pain, unspecified 01/13/2014  . Chest pain 01/13/2014  . Abnormal EKG- inferior TWI 01/13/2014  . OSA (obstructive sleep apnea) 03/09/2013   Past Medical History:  Diagnosis Date  . Anxiety   . Depression   . History of kidney stones   . Hyperlipidemia   .  Hypertension   . OSA (obstructive sleep apnea) 03/09/2013   denies; does not use CPAP   . Pre-diabetes    last A1C in 2015 5.9% see 01-13-14 lab epic   . Sleep apnea   . Wound, breast    superficial appearing , reddened area to left upper breast; per patient, has been in sun recently , denies pain or drainage , "just itchy"     Past Surgical History:  Procedure Laterality Date  . BACK SURGERY    . BREATH TEK H PYLORI N/A 08/31/2014   Procedure: BREATH TEK H PYLORI;  Surgeon: Alphonsa Overall, MD;  Location: [facility];  Service: General;  Laterality: N/A;  . LAPAROSCOPIC GASTRIC SLEEVE RESECTION N/A 02/20/2015   Procedure: LAPAROSCOPIC GASTRIC SLEEVE RESECTION;  Surgeon: Alphonsa Overall, MD;  Location: [facility];  Service: General;  Laterality: N/A;  . PARTIAL HYSTERECTOMY    . sleep study  04/08/13  . THYROID SURGERY      No prescriptions prior to admission.   Allergies  Allergen Reactions  . Cortisone Swelling  . Hydrocodone Itching  . Prednisone Swelling    Social History  Substance Use Topics  . Smoking status: Current Every Day Smoker    Packs/day: 0.50    Years: 20.00    Types: Cigarettes  . Smokeless tobacco: Never Used  . Alcohol use No    Family History  Problem Relation Age of Onset  . COPD Mother   . Hypertension Mother   . Heart disease Mother   . Heart attack Father   . Heart disease Father   . Hypertension Sister   . Hypertension Brother   . Colon  cancer Neg Hx      ROS ROS: I have reviewed the patient's review of systems thoroughly and there are no positive responses as relates to the HPI. Objective:  Physical Exam Well-developed well-nourished patient in no acute distress. Alert and oriented x3 HEENT:within normal limits Cardiac: Regular rate and rhythm Pulmonary: Lungs clear to auscultation Abdomen: Soft and nontender.  Normal active bowel sounds  Musculoskeletal: (right hip: Severe pain with internal rotation. Limited internal rotation. Painful  range of motion. Neurovascularly intact distally. Vital signs in last 24 hours:   Vitals:   02/27/17 1014  BP: (!) 147/92  Pulse: 73  Resp: 16  Temp: 98.5 F (36.9 C)  SpO2: 100%   Labs: Recent Results (from the past 2160 hour(s))  Basic metabolic panel     Status: Abnormal   Collection Time: 12/31/16 11:30 AM  Result Value Ref Range   Sodium 141 135 - 145 mmol/L   Potassium 4.4 3.5 - 5.1 mmol/L   Chloride 106 101 - 111 mmol/L   CO2 27 22 - 32 mmol/L   Glucose, Bld 103 (H) 65 - 99 mg/dL   BUN 18 6 - 20 mg/dL   Creatinine, Ser 0.85 0.44 - 1.00 mg/dL   Calcium 9.0 8.9 - 10.3 mg/dL   GFR calc non Af Amer >60 >60 mL/min   GFR calc Af Amer >60 >60 mL/min    Comment: (NOTE) The eGFR has been calculated using the CKD EPI equation. This calculation has not been validated in all clinical situations. eGFR's persistently <60 mL/min signify possible Chronic Kidney Disease.    Anion gap 8 5 - 15  CBC     Status: None   Collection Time: 12/31/16 11:30 AM  Result Value Ref Range   WBC 6.1 4.0 - 10.5 K/uL   RBC 4.85 3.87 - 5.11 MIL/uL   Hemoglobin 14.6 12.0 - 15.0 g/dL   HCT 43.5 36.0 - 46.0 %   MCV 89.7 78.0 - 100.0 fL   MCH 30.1 26.0 - 34.0 pg   MCHC 33.6 30.0 - 36.0 g/dL   RDW 12.7 11.5 - 15.5 %   Platelets 240 150 - 400 K/uL  I-stat troponin, ED     Status: None   Collection Time: 12/31/16 12:04 PM  Result Value Ref Range   Troponin i, poc 0.00 0.00 - 0.08 ng/mL   Comment 3            Comment: Due to the release kinetics of cTnI, a negative result within the first hours of the onset of symptoms does not rule out myocardial infarction with certainty. If myocardial infarction is still suspected, repeat the test at appropriate intervals.   Comprehensive metabolic panel     Status: Abnormal   Collection Time: 12/31/16  3:00 PM  Result Value Ref Range   Sodium 139 135 - 145 mmol/L   Potassium 4.1 3.5 - 5.1 mmol/L   Chloride 106 101 - 111 mmol/L   CO2 25 22 - 32 mmol/L    Glucose, Bld 95 65 - 99 mg/dL   BUN 16 6 - 20 mg/dL   Creatinine, Ser 0.80 0.44 - 1.00 mg/dL   Calcium 8.7 (L) 8.9 - 10.3 mg/dL   Total Protein 5.9 (L) 6.5 - 8.1 g/dL   Albumin 3.7 3.5 - 5.0 g/dL   AST 18 15 - 41 U/L   ALT 15 14 - 54 U/L   Alkaline Phosphatase 80 38 - 126 U/L   Total Bilirubin 1.5 (H)  0.3 - 1.2 mg/dL   GFR calc non Af Amer >60 >60 mL/min   GFR calc Af Amer >60 >60 mL/min    Comment: (NOTE) The eGFR has been calculated using the CKD EPI equation. This calculation has not been validated in all clinical situations. eGFR's persistently <60 mL/min signify possible Chronic Kidney Disease.    Anion gap 8 5 - 15  I-stat troponin, ED     Status: None   Collection Time: 12/31/16  3:05 PM  Result Value Ref Range   Troponin i, poc 0.00 0.00 - 0.08 ng/mL   Comment 3            Comment: Due to the release kinetics of cTnI, a negative result within the first hours of the onset of symptoms does not rule out myocardial infarction with certainty. If myocardial infarction is still suspected, repeat the test at appropriate intervals.   Surgical pcr screen     Status: Abnormal   Collection Time: 02/24/17 10:25 AM  Result Value Ref Range   MRSA, PCR NEGATIVE NEGATIVE   Staphylococcus aureus POSITIVE (A) NEGATIVE    Comment: (NOTE) The Xpert SA Assay (FDA approved for NASAL specimens in patients 31 years of age and older), is one component of a comprehensive surveillance program. It is not intended to diagnose infection nor to guide or monitor treatment.   Urinalysis, Routine w reflex microscopic     Status: None   Collection Time: 02/24/17 10:45 AM  Result Value Ref Range   Color, Urine YELLOW YELLOW   APPearance CLEAR CLEAR   Specific Gravity, Urine 1.020 1.005 - 1.030   pH 7.5 5.0 - 8.0   Glucose, UA NEGATIVE NEGATIVE mg/dL   Hgb urine dipstick NEGATIVE NEGATIVE   Bilirubin Urine NEGATIVE NEGATIVE   Ketones, ur NEGATIVE NEGATIVE mg/dL   Protein, ur NEGATIVE  NEGATIVE mg/dL   Nitrite NEGATIVE NEGATIVE   Leukocytes, UA NEGATIVE NEGATIVE    Comment: Microscopic not done on urines with negative protein, blood, leukocytes, nitrite, or glucose < 500 mg/dL.  ABO/Rh     Status: None   Collection Time: 02/24/17 10:50 AM  Result Value Ref Range   ABO/RH(D) O POS   Type and screen Order type and screen if day of surgery is less than 15 days from draw of preadmission visit or order morning of surgery if day of surgery is greater than 6 days from preadmission visit.     Status: None   Collection Time: 02/24/17 10:52 AM  Result Value Ref Range   ABO/RH(D) O POS    Antibody Screen NEG    Sample Expiration 03/02/2017    Extend sample reason NO TRANSFUSIONS OR PREGNANCY IN THE PAST 3 MONTHS   Hemoglobin A1c     Status: None   Collection Time: 02/24/17 11:02 AM  Result Value Ref Range   Hgb A1c MFr Bld 5.4 4.8 - 5.6 %    Comment: (NOTE) Pre diabetes:          5.7%-6.4% Diabetes:              >6.4% Glycemic control for   <7.0% adults with diabetes    Mean Plasma Glucose 108.28 mg/dL    Comment: Performed at [facility] [address], [place], [place] [address]  APTT     Status: None   Collection Time: 02/24/17 11:02 AM  Result Value Ref Range   aPTT 33 24 - 36 seconds  CBC WITH DIFFERENTIAL  Status: None   Collection Time: 02/24/17 11:02 AM  Result Value Ref Range   WBC 6.2 4.0 - 10.5 K/uL   RBC 4.89 3.87 - 5.11 MIL/uL   Hemoglobin 14.5 12.0 - 15.0 g/dL   HCT 44.8 36.0 - 46.0 %   MCV 91.6 78.0 - 100.0 fL   MCH 29.7 26.0 - 34.0 pg   MCHC 32.4 30.0 - 36.0 g/dL   RDW 13.0 11.5 - 15.5 %   Platelets 233 150 - 400 K/uL   Neutrophils Relative % 63 %   Neutro Abs 4.0 1.7 - 7.7 K/uL   Lymphocytes Relative 29 %   Lymphs Abs 1.8 0.7 - 4.0 K/uL   Monocytes Relative 5 %   Monocytes Absolute 0.3 0.1 - 1.0 K/uL   Eosinophils Relative 2 %   Eosinophils Absolute 0.1 0.0 - 0.7 K/uL   Basophils Relative 1 %   Basophils Absolute 0.0 0.0 - 0.1  K/uL  Comprehensive metabolic panel     Status: Abnormal   Collection Time: 02/24/17 11:02 AM  Result Value Ref Range   Sodium 145 135 - 145 mmol/L   Potassium 5.3 (H) 3.5 - 5.1 mmol/L   Chloride 110 101 - 111 mmol/L   CO2 26 22 - 32 mmol/L   Glucose, Bld 95 65 - 99 mg/dL   BUN 22 (H) 6 - 20 mg/dL   Creatinine, Ser 0.86 0.44 - 1.00 mg/dL   Calcium 9.0 8.9 - 10.3 mg/dL   Total Protein 7.1 6.5 - 8.1 g/dL   Albumin 4.1 3.5 - 5.0 g/dL   AST 21 15 - 41 U/L   ALT 14 14 - 54 U/L   Alkaline Phosphatase 101 38 - 126 U/L   Total Bilirubin 1.2 0.3 - 1.2 mg/dL   GFR calc non Af Amer >60 >60 mL/min   GFR calc Af Amer >60 >60 mL/min    Comment: (NOTE) The eGFR has been calculated using the CKD EPI equation. This calculation has not been validated in all clinical situations. eGFR's persistently <60 mL/min signify possible Chronic Kidney Disease.    Anion gap 9 5 - 15  Protime-INR     Status: None   Collection Time: 02/24/17 11:02 AM  Result Value Ref Range   Prothrombin Time 11.7 11.4 - 15.2 seconds   INR 0.87     Estimated body mass index is 35.85 kg/m as calculated from the following:   Height as of 02/24/17: 5' 3.5" (1.613 m).   Weight as of 02/24/17: 93.3 kg (205 lb 9.6 oz).   Imaging Review Plain radiographs demonstrate mild degenerative joint disease of the right hip(s). The bone quality appears to be fair for age and reported activity level.  Assessment/Plan:  End stage arthritis, right hip(s)  The patient history, physical examination, clinical judgement of the provider and imaging studies are consistent with end stage degenerative joint disease of the right hip(s) and total hip arthroplasty is deemed medically necessary. The treatment options including medical management, injection therapy, arthroscopy and arthroplasty were discussed at length. The risks and benefits of total hip arthroplasty were presented and reviewed. The risks due to aseptic loosening, infection, stiffness,  dislocation/subluxation,  thromboembolic complications and other imponderables were discussed.  The patient acknowledged the explanation, agreed to proceed with the plan and consent was signed. Patient is being admitted for inpatient treatment for surgery, pain control, PT, OT, prophylactic antibiotics, VTE prophylaxis, progressive ambulation and ADL's and discharge planning.The patient is planning to be discharged home with  home health services

## 2017-02-27 NOTE — Progress Notes (Signed)
Dr. Roderic Palau notified of patient's blood pressures in PACU- O.K. To go to floor

## 2017-02-27 NOTE — Anesthesia Preprocedure Evaluation (Addendum)
Anesthesia Evaluation  Patient identified by MRN, date of birth, ID band Patient awake    Reviewed: Allergy & Precautions, H&P , NPO status , Patient's Chart, lab work & pertinent test results  Airway Mallampati: III  TM Distance: >3 FB Neck ROM: Full    Dental no notable dental hx. (+) Teeth Intact, Dental Advisory Given   Pulmonary Current Smoker,    Pulmonary exam normal breath sounds clear to auscultation       Cardiovascular hypertension, Pt. on medications  Rhythm:Regular Rate:Normal     Neuro/Psych Anxiety Depression negative neurological ROS     GI/Hepatic negative GI ROS, Neg liver ROS,   Endo/Other  negative endocrine ROS  Renal/GU negative Renal ROS  negative genitourinary   Musculoskeletal   Abdominal   Peds  Hematology negative hematology ROS (+)   Anesthesia Other Findings   Reproductive/Obstetrics negative OB ROS                            Anesthesia Physical Anesthesia Plan  ASA: II  Anesthesia Plan: Spinal   Post-op Pain Management:    Induction: Intravenous  PONV Risk Score and Plan: 2 and Ondansetron, Dexamethasone, Propofol infusion and Midazolam  Airway Management Planned: Simple Face Mask  Additional Equipment:   Intra-op Plan:   Post-operative Plan:   Informed Consent: I have reviewed the patients History and Physical, chart, labs and discussed the procedure including the risks, benefits and alternatives for the proposed anesthesia with the patient or authorized representative who has indicated his/her understanding and acceptance.   Dental advisory given  Plan Discussed with: CRNA and Surgeon  Anesthesia Plan Comments:        Anesthesia Quick Evaluation

## 2017-02-27 NOTE — Anesthesia Postprocedure Evaluation (Signed)
Anesthesia Post Note  Patient: [name]  Procedure(s) Performed: RIGHT TOTAL HIP ARTHROPLASTY ANTERIOR APPROACH (Right Hip)     Patient location during evaluation: PACU Anesthesia Type: Spinal Level of consciousness: awake and alert Pain management: pain level controlled Vital Signs Assessment: post-procedure vital signs reviewed and stable Respiratory status: spontaneous breathing, respiratory function stable and patient connected to nasal cannula oxygen Cardiovascular status: blood pressure returned to baseline and stable Postop Assessment: spinal receding and no apparent nausea or vomiting Anesthetic complications: no    Last Vitals:  Vitals:   02/27/17 1446 02/27/17 1500  BP: 94/64   Pulse: 65   Resp: 14 12  Temp:    SpO2: 96%     Last Pain:  Vitals:   02/27/17 1445  TempSrc:   PainSc: 2                  Taylin Mans,W. EDMOND

## 2017-02-27 NOTE — Progress Notes (Signed)
Pre op EKG and CXR ordered. Patient has normal EKG and CXR from 12/31/16. Orders discontinued.

## 2017-02-27 NOTE — Discharge Instructions (Signed)

## 2017-02-27 NOTE — Transfer of Care (Signed)
Immediate Anesthesia Transfer of Care Note  Patient: [name]  Procedure(s) Performed: RIGHT TOTAL HIP ARTHROPLASTY ANTERIOR APPROACH (Right Hip)  Patient Location: PACU  Anesthesia Type:Spinal  Level of Consciousness: awake, alert  and oriented  Airway & Oxygen Therapy: Patient Spontanous Breathing and Patient connected to face mask oxygen  Post-op Assessment: Report given to RN and Post -op Vital signs reviewed and stable  Post vital signs: Reviewed and stable  Last Vitals:  Vitals:   02/27/17 1014  BP: (!) 147/92  Pulse: 73  Resp: 16  Temp: 36.9 C  SpO2: 100%    Last Pain:  Vitals:   02/27/17 1051  TempSrc:   PainSc: 5       Patients Stated Pain Goal: 4 (28/63/81 [id number])  Complications: No apparent anesthesia complications

## 2017-02-27 NOTE — Anesthesia Procedure Notes (Signed)
Spinal  Patient location during procedure: OR End time: 02/27/2017 12:21 PM Staffing Resident/CRNA: Noralyn Pick D Performed: anesthesiologist  Preanesthetic Checklist Completed: patient identified, site marked, surgical consent, pre-op evaluation, timeout performed, IV checked, risks and benefits discussed and monitors and equipment checked Spinal Block Patient position: sitting Prep: Betadine Patient monitoring: heart rate, continuous pulse ox and blood pressure Approach: midline Location: L2-3 Injection technique: single-shot Needle Needle type: Sprotte  Needle gauge: 24 G Needle length: 9 cm Assessment Sensory level: T6 Additional Notes Expiration date of kit checked and confirmed. Patient tolerated procedure well, without complications.

## 2017-02-28 LAB — CBC
HEMATOCRIT: 33.7 % — AB (ref 36.0–46.0)
Hemoglobin: 11.1 g/dL — ABNORMAL LOW (ref 12.0–15.0)
MCH: 30.1 pg (ref 26.0–34.0)
MCHC: 32.9 g/dL (ref 30.0–36.0)
MCV: 91.3 fL (ref 78.0–100.0)
PLATELETS: 216 10*3/uL (ref 150–400)
RBC: 3.69 MIL/uL — ABNORMAL LOW (ref 3.87–5.11)
RDW: 13 % (ref 11.5–15.5)
WBC: 16.3 10*3/uL — AB (ref 4.0–10.5)

## 2017-02-28 NOTE — Care Management Note (Signed)
Case Management Note  Patient Details  Name: [name] MRN: [id number] Date of Birth: Jul 22, 1960  Subjective/Objective:   S/p anterior R THR                 Action/Plan: Discharge Planning: NCM spoke to pt and offered choice for [facility]. Pt requested [facility] for HH. Pt requesting RW and 3n1 bedside commode. States family at home to assist with care. Contacted [facility] with new referral for [facility] and DME. DME will be deliver to room prior to dc.  PCP  Jani Gravel MD    Expected Discharge Date:  02/28/17               Expected Discharge Plan:  [place]  In-House Referral:  NA  Discharge planning Services  CM Consult  Post Acute Care Choice:  Home Health Choice offered to:  Patient  DME Arranged:  3-N-1, Walker rolling DME Agency:  [place]:  PT [facility] Agency:  [place]  Status of Service:  Completed, signed off  If discussed at [place] of Stay Meetings, dates discussed:    Additional Comments:  Erenest Rasher, RN 02/28/2017, 12:43 PM

## 2017-02-28 NOTE — Progress Notes (Signed)
Physical Therapy Treatment Patient Details Name: [name] MRN: [id number] DOB: Dec 02, 1960 Today's Date: 02/28/2017    History of Present Illness 56 yo female with onset of OA R hip increasing symptoms and now has THA with direct anterior approach and WBAT.      PT Comments    Pt is seen for mobility to bed with minor gait and not tolerant of there ex due to pain complaints.  Nursing had not come to get pt back to bed so will anticipate her need to move more often to avoid stiffness and pain related to disuse.  Her nurse is aware that PT returned pt to bed and agreed to help monitor her safety and mobility. Also talked with nursing about assisting pt to extend her posture more over the next hour.   Follow Up Recommendations  Home health PT;Supervision for mobility/OOB     Equipment Recommendations  Rolling walker with 5" wheels    Recommendations for Other Services       Precautions / Restrictions Precautions Precautions: Fall;Other (comment) (WBAT) Precaution Comments: monitor her tolerance for mobility due to pain Restrictions Weight Bearing Restrictions: No    Mobility  Bed Mobility Overal bed mobility: Needs Assistance Bed Mobility: Supine to Sit     Supine to sit: Min assist     General bed mobility comments: minor help to assist pivot to side of bed  Transfers Overall transfer level: Needs assistance Equipment used: Rolling walker (2 wheeled) Transfers: Sit to/from Stand Sit to Stand: Min assist         General transfer comment: minor standing help from higher bed  Ambulation/Gait Ambulation/Gait assistance: Min guard;Min assist   Assistive device: Rolling walker (2 wheeled);1 person hand held assist Gait Pattern/deviations: Step-to pattern;Step-through pattern;Decreased stride length;Wide base of support;Drifts right/left;Trunk flexed;Shuffle Gait velocity: reduced   General Gait Details: pt began to decrease WB on RLE as she walked longer on the  hall   Stairs            Wheelchair Mobility    Modified Rankin (Stroke Patients Only)       Balance Overall balance assessment: Needs assistance Sitting-balance support: Feet supported Sitting balance-Leahy Scale: Fair     Standing balance support: Bilateral upper extremity supported;During functional activity Standing balance-Leahy Scale: Poor                              Cognition Arousal/Alertness: Awake/alert Behavior During Therapy: WFL for tasks assessed/performed Overall Cognitive Status: Within Functional Limits for tasks assessed                                        Exercises General Exercises - Lower Extremity Ankle Circles/Pumps: AROM;Both;10 reps Quad Sets: AROM;Both;10 reps Gluteal Sets: AROM;Both;10 reps    General Comments General comments (skin integrity, edema, etc.): Pt is demonstrating edema and bruising under water proof bandage on R knee with plan to ice and manage pain with meds      Pertinent Vitals/Pain Pain Assessment: 0-10 Pain Score: 8  Pain Location: R hip with initial standing Pain Descriptors / Indicators: Aching;Operative site guarding;Burning Pain Intervention(s): Limited activity within patient's tolerance;Monitored during session;Premedicated before session;Repositioned;Ice applied    Home Living Family/patient expects to be discharged to:: Private residence Living Arrangements: Spouse/significant other Available Help at Discharge: Family;Available 24 hours/day Type of Home: House  Home Access: Stairs to enter Entrance Stairs-Rails: Right;Left;Can reach both Home Layout: One level Home Equipment: None Additional Comments: has been home with no AD for symptoms    Prior Function Level of Independence: Independent          PT Goals (current goals can now be found in the care plan section) Acute Rehab PT Goals Patient Stated Goal: to hurt less and walk better PT Goal Formulation: With  patient Time For Goal Achievement: 03/14/17 Potential to Achieve Goals: Good    Frequency    Min 3X/week      PT Plan      Co-evaluation              AM-PAC PT "6 Clicks" Daily Activity  Outcome Measure  Difficulty turning over in bed (including adjusting bedclothes, sheets and blankets)?: Unable Difficulty moving from lying on back to sitting on the side of the bed? : Unable Difficulty sitting down on and standing up from a chair with arms (e.g., wheelchair, bedside commode, etc,.)?: Unable Help needed moving to and from a bed to chair (including a wheelchair)?: A Lot Help needed walking in hospital room?: A Lot Help needed climbing 3-5 steps with a railing? : Total 6 Click Score: 8    End of Session Equipment Utilized During Treatment: Gait belt Activity Tolerance: Patient tolerated treatment well;Patient limited by fatigue;Patient limited by pain;Treatment limited secondary to medical complications (Comment) Patient left: in chair;with call bell/phone within reach;with family/visitor present Nurse Communication: Mobility status (need for pain meds) PT Visit Diagnosis: Unsteadiness on feet (R26.81);Muscle weakness (generalized) (M62.81);Ataxic gait (R26.0);Pain Pain - Right/Left: Right Pain - part of body: Knee     Time: 1545-1606 PT Time Calculation (min) (ACUTE ONLY): 21 min  Charges:  $Gait Training: 8-22 mins $Therapeutic Exercise: 8-22 mins                    G Codes:  Functional Assessment Tool Used: AM-PAC 6 Clicks Basic Mobility     Ramond Dial 02/28/2017, 6:24 PM   Mee Hives, PT MS Acute Rehab Dept. Number: [place] and [place]

## 2017-02-28 NOTE — Progress Notes (Cosign Needed)
    Patient seen early this morning and is doing well PO day 1 S/P ANT R THR per Dr Berenice Primas team. She reports moderate pain and just received her morning pain meds. She has not yet been up with PT. She is eating well and drinking well, + passing gas. She still has her foley in place and is eager to have this removed.    Physical Exam: Vitals:   02/28/17 0550 02/28/17 0855  BP: 99/69 102/68  Pulse: 62 66  Resp: 16 16  Temp: 98 F (36.7 C) 98.1 F (36.7 C)  SpO2: 94% 95%   CBC Latest Ref Rng & Units 02/28/2017 02/24/2017 12/31/2016  WBC 4.0 - 10.5 K/uL 16.3(H) 6.2 6.1  Hemoglobin 12.0 - 15.0 g/dL 11.1(L) 14.5 14.6  Hematocrit 36.0 - 46.0 % 33.7(L) 44.8 43.5  Platelets 150 - 400 K/uL 216 233 240    Dressing in place, CDI, ice bags in place, no ecchy, distal compartments soft and non-tender, SCD's in place, pt resting comfortably in bed after eating breakfast, foley in place NVI  POD #1 s/p ANT R THR doing well with expected PO hip discomfort  - D/C Foley  - Up with PT/OT, encourage ambulation, pt informed of importance of ambulation and activity  -Likely D/C today after second PT session if cleared  -Needs HH and supplies established  - Percocet for pain, Robaxin for muscle spasms  -Scripts printed and signed in and chart for D/C ASA 325 BID x 4 weeks for DVT prophylaxis   -Script printed and signed in chart - Likely d/c home today after PT/OT

## 2017-02-28 NOTE — Plan of Care (Signed)
Problem: Pain Managment: Goal: General experience of comfort will improve Outcome: Not Progressing Medicated twice for pain this shift with mild pain relief  Problem: Skin Integrity: Goal: Risk for impaired skin integrity will decrease Outcome: Progressing No skin impairement noted  Problem: Activity: Goal: Risk for activity intolerance will decrease Outcome: Not Progressing Out of bed with to [facility] with difficulties  Problem: Bowel/Gastric: Goal: Will not experience complications related to bowel motility Outcome: Progressing No bowel or gastric issues reported  Problem: Activity: Goal: Will remain free from falls Outcome: Progressing VS WNL, no S/S of infection noted

## 2017-02-28 NOTE — Evaluation (Signed)
Physical Therapy Evaluation Patient Details Name: [name] MRN: [id number] DOB: Nov 02, 1960 Today's Date: 02/28/2017   History of Present Illness  56 yo female with onset of OA R hip increasing symptoms and now has THA with direct anterior approach and WBAT.    Clinical Impression  Pt is up to walk in hallway today, then instructed her in warm up THA exercises to use for avoidance of burning pain on lateral R thigh at the start of gait.  Will follow up acutely to increase strength and tolerance of wbing postures on RLE from surgery.  Her plan is to go home with HHPT and with significant other and will try to increase her mobility and safety by then.      Follow Up Recommendations Home health PT;Supervision for mobility/OOB    Equipment Recommendations  Rolling walker with 5" wheels    Recommendations for Other Services       Precautions / Restrictions Precautions Precautions: Fall;Other (comment) (WBAT) Precaution Comments: monitor her tolerance for mobility due to pain Restrictions Weight Bearing Restrictions: No      Mobility  Bed Mobility Overal bed mobility: Needs Assistance Bed Mobility: Supine to Sit     Supine to sit: Min assist     General bed mobility comments: minor help to assist pivot to side of bed  Transfers Overall transfer level: Needs assistance Equipment used: Rolling walker (2 wheeled) Transfers: Sit to/from Stand Sit to Stand: Min assist         General transfer comment: minor standing help from higher bed  Ambulation/Gait Ambulation/Gait assistance: Min guard;Min assist Ambulation Distance (Feet): 80 Feet Assistive device: Rolling walker (2 wheeled);1 person hand held assist Gait Pattern/deviations: Step-to pattern;Step-through pattern;Decreased stride length;Wide base of support;Drifts right/left;Trunk flexed;Shuffle Gait velocity: reduced Gait velocity interpretation: Below normal speed for age/gender General Gait Details: pt began  to decrease WB on RLE as she walked longer on the hall  Stairs            Wheelchair Mobility    Modified Rankin (Stroke Patients Only)       Balance Overall balance assessment: Needs assistance Sitting-balance support: Feet supported Sitting balance-Leahy Scale: Fair     Standing balance support: Bilateral upper extremity supported;During functional activity Standing balance-Leahy Scale: Poor                               Pertinent Vitals/Pain Pain Assessment: 0-10 Pain Score: 8  Pain Location: R hip with initial standing Pain Descriptors / Indicators: Aching;Operative site guarding;Burning Pain Intervention(s): Limited activity within patient's tolerance;Monitored during session;Premedicated before session;Repositioned;Ice applied    Home Living Family/patient expects to be discharged to:: Private residence Living Arrangements: Spouse/significant other Available Help at Discharge: Family;Available 24 hours/day Type of Home: House Home Access: Stairs to enter Entrance Stairs-Rails: Right;Left;Can reach both Entrance Stairs-Number of Steps: 3+1 Home Layout: One level Home Equipment: None Additional Comments: has been home with no AD for symptoms    Prior Function Level of Independence: Independent               Hand Dominance        Extremity/Trunk Assessment   Upper Extremity Assessment Upper Extremity Assessment: Overall WFL for tasks assessed    Lower Extremity Assessment Lower Extremity Assessment: RLE deficits/detail RLE Deficits / Details: R hip was 3- strength with pain initial movement and after a longer walk RLE: Unable to fully assess due to pain RLE  Coordination: decreased fine motor;decreased gross motor    Cervical / Trunk Assessment Cervical / Trunk Assessment: Normal  Communication   Communication: No difficulties  Cognition Arousal/Alertness: Awake/alert Behavior During Therapy: WFL for tasks  assessed/performed Overall Cognitive Status: Within Functional Limits for tasks assessed                                        General Comments General comments (skin integrity, edema, etc.): Pt is demonstrating edema and bruising under water proof bandage on R knee with plan to ice and manage pain with meds    Exercises General Exercises - Lower Extremity Ankle Circles/Pumps: AROM;Both;10 reps Quad Sets: AROM;Both;10 reps Gluteal Sets: AROM;Both;10 reps   Assessment/Plan    PT Assessment Patient needs continued PT services  PT Problem List Decreased strength;Decreased range of motion;Decreased activity tolerance;Decreased balance;Decreased mobility;Decreased coordination;Decreased cognition;Decreased knowledge of use of DME;Decreased safety awareness;Obesity;Decreased skin integrity;Pain       PT Treatment Interventions DME instruction;Gait training;Stair training;Functional mobility training;Therapeutic activities;Therapeutic exercise;Balance training;Neuromuscular re-education;Patient/family education    PT Goals (Current goals can be found in the Care Plan section)  Acute Rehab PT Goals Patient Stated Goal: to hurt less and walk better PT Goal Formulation: With patient Time For Goal Achievement: 03/14/17 Potential to Achieve Goals: Good    Frequency Min 3X/week   Barriers to discharge Inaccessible home environment has stairs to enter house with lengthy level     Co-evaluation               AM-PAC PT "6 Clicks" Daily Activity  Outcome Measure Difficulty turning over in bed (including adjusting bedclothes, sheets and blankets)?: Unable Difficulty moving from lying on back to sitting on the side of the bed? : Unable Difficulty sitting down on and standing up from a chair with arms (e.g., wheelchair, bedside commode, etc,.)?: Unable Help needed moving to and from a bed to chair (including a wheelchair)?: A Lot Help needed walking in hospital room?: A  Lot Help needed climbing 3-5 steps with a railing? : Total 6 Click Score: 8    End of Session Equipment Utilized During Treatment: Gait belt Activity Tolerance: Patient tolerated treatment well;Patient limited by fatigue;Patient limited by pain;Treatment limited secondary to medical complications (Comment) Patient left: in chair;with call bell/phone within reach;with family/visitor present Nurse Communication: Mobility status (need for pain meds) PT Visit Diagnosis: Unsteadiness on feet (R26.81);Muscle weakness (generalized) (M62.81);Ataxic gait (R26.0);Pain Pain - Right/Left: Right Pain - part of body: Knee    Time: 1335-1415 PT Time Calculation (min) (ACUTE ONLY): 40 min   Charges:   PT Evaluation $PT Eval Moderate Complexity: 1 Mod PT Treatments $Gait Training: 8-22 mins $Therapeutic Exercise: 8-22 mins   PT G Codes:   PT G-Codes **NOT FOR INPATIENT CLASS** Functional Assessment Tool Used: AM-PAC 6 Clicks Basic Mobility    Ramond Dial 02/28/2017, 6:18 PM   Mee Hives, PT MS Acute Rehab Dept. Number: [place]

## 2017-03-01 LAB — CBC
HCT: 30 % — ABNORMAL LOW (ref 36.0–46.0)
Hemoglobin: 9.8 g/dL — ABNORMAL LOW (ref 12.0–15.0)
MCH: 30.2 pg (ref 26.0–34.0)
MCHC: 32.7 g/dL (ref 30.0–36.0)
MCV: 92.3 fL (ref 78.0–100.0)
PLATELETS: 195 10*3/uL (ref 150–400)
RBC: 3.25 MIL/uL — ABNORMAL LOW (ref 3.87–5.11)
RDW: 13.4 % (ref 11.5–15.5)
WBC: 12.6 10*3/uL — AB (ref 4.0–10.5)

## 2017-03-01 MED ORDER — LIDOCAINE 5 % EX OINT
1.0000 "application " | TOPICAL_OINTMENT | Freq: Four times a day (QID) | CUTANEOUS | Status: DC | PRN
  Filled 2017-03-01: qty 35.44

## 2017-03-01 NOTE — Progress Notes (Signed)
    Patient doing well PO day 2 S/P ANT R THR per Dr Berenice Primas team. She reports moderate pain and doing well. She describes some burning pain about her anterior-lateral right thigh and mild numbness. She has been up with PT with good progress. She is eating well and drinking well, + passing gas. She is eager to progress home.    Physical Exam: Vitals:   02/28/17 2202 03/01/17 0646  BP: 122/70 109/65  Pulse: (!) 55 (!) 52  Resp: 15 18  Temp: 98.5 F (36.9 C) (!) 97.3 F (36.3 C)  SpO2: 98% 98%   CBC Latest Ref Rng & Units 03/01/2017 02/28/2017 02/24/2017  WBC 4.0 - 10.5 K/uL 12.6(H) 16.3(H) 6.2  Hemoglobin 12.0 - 15.0 g/dL 9.8(L) 11.1(L) 14.5  Hematocrit 36.0 - 46.0 % 30.0(L) 33.7(L) 44.8  Platelets 150 - 400 K/uL 195 216 233    Dressing in place, CDI, ice bags in place, no ecchy, distal compartments soft and non-tender, SCD's in place, pt resting comfortably in bed, no warmth or swelling about the right thigh  POD #2 s/p ANT R THR doing well with expected PO hip discomfort  - Up with PT/OT, encourage ambulation, pt informed of importance of ambulation and activity             -HH set up - Potential lateral femoral cutaneous nerve root irritation  -Trial lidocaine cream  - Percocet for pain, Robaxin for muscle spasms             -Scripts printed and signed in and chart for D/C ASA 325 BID x 4 weeks for DVT prophylaxis              -Script printed and signed in chart - D/C home today

## 2017-03-01 NOTE — Progress Notes (Signed)
Physical Therapy Treatment Patient Details Name: [name] MRN: [id number] DOB: [birth year] Today's Date: 03/01/2017    History of Present Illness 56 yo female with onset of OA R hip increasing symptoms and now has THA with direct anterior approach and WBAT.      PT Comments     Pt still with severe pain in R anterior lateral thigh (has not improved and gets worse with amy movement) . Increases with any movement and severe with any weight bearing on RLE . This is limiting her ability to move and she is trying so hard (in tears).    Her session today was limited to just getting out of bed and walking with RW in hallway limited distance. Did not try exercises due to her pain.      Follow Up Recommendations  Home health PT;Supervision for mobility/OOB     Equipment Recommendations  Rolling walker with 5" wheels    Recommendations for Other Services       Precautions / Restrictions Precautions Precautions: Fall;Other (comment) (WBAT) Precaution Comments: monitor her tolerance for mobility due to pain Restrictions Weight Bearing Restrictions: No    Mobility  Bed Mobility Overal bed mobility: Needs Assistance Bed Mobility: Supine to Sit;Sit to Supine     Supine to sit: Min assist Sit to supine: Min assist   General bed mobility comments: help to assist the R LE , and use of trapeze for repositioning due to severe pain   Transfers Overall transfer level: Needs assistance Equipment used: Rolling walker (2 wheeled) Transfers: Sit to/from Stand Sit to Stand: Min assist         General transfer comment: minor standing help from higher bed  Ambulation/Gait Ambulation/Gait assistance: Min assist Ambulation Distance (Feet): 25 Feet Assistive device: Rolling walker (2 wheeled) Gait Pattern/deviations: Step-to pattern     General Gait Details: pt step pattern, had to utilize Ue sto decrease WB on R LE due to severe pain with any weight bearing    Stairs             Wheelchair Mobility    Modified Rankin (Stroke Patients Only)       Balance Overall balance assessment: Needs assistance Sitting-balance support: Feet supported Sitting balance-Leahy Scale: Fair     Standing balance support: Bilateral upper extremity supported;During functional activity Standing balance-Leahy Scale: Poor                              Cognition Arousal/Alertness: Awake/alert Behavior During Therapy: WFL for tasks assessed/performed Overall Cognitive Status: Within Functional Limits for tasks assessed                                        Exercises      General Comments        Pertinent Vitals/Pain Pain Score: 9  Pain Location: Pt had pain yesterday and continues to be really bad today as well, with any movement and exacerbates with even the slightest WB on R LE . Located in specific area in R anterior lateral thigh area.  Pain Descriptors / Indicators: Aching;Operative site guarding;Burning Pain Intervention(s): Limited activity within patient's tolerance;Monitored during session;Premedicated before session;Ice applied    Home Living                      Prior Function  PT Goals (current goals can now be found in the care plan section) Acute Rehab PT Goals Patient Stated Goal: to hurt less and walk better PT Goal Formulation: With patient Time For Goal Achievement: 03/14/17 Potential to Achieve Goals: Good Progress towards PT goals: Progressing toward goals (slowly due to unusual painin R anterior thigh area limiting pt's ability to progress)    Frequency    7X/week      PT Plan Current plan remains appropriate    Co-evaluation              AM-PAC PT "6 Clicks" Daily Activity  Outcome Measure  Difficulty turning over in bed (including adjusting bedclothes, sheets and blankets)?: Unable Difficulty moving from lying on back to sitting on the side of the bed? :  Unable Difficulty sitting down on and standing up from a chair with arms (e.g., wheelchair, bedside commode, etc,.)?: Unable Help needed moving to and from a bed to chair (including a wheelchair)?: A Lot Help needed walking in hospital room?: A Lot Help needed climbing 3-5 steps with a railing? : Total 6 Click Score: 8    End of Session   Activity Tolerance: Patient limited by pain Patient left: with call bell/phone within reach;in bed Nurse Communication: Mobility status (need for pain meds) PT Visit Diagnosis: Unsteadiness on feet (R26.81);Muscle weakness (generalized) (M62.81);Pain Pain - Right/Left: Right Pain - part of body: Knee     Time: 0940-1009 PT Time Calculation (min) (ACUTE ONLY): 29 min  Charges:  $Gait Training: 8-22 mins $Therapeutic Activity: 8-22 mins                    G Codes:  Functional Assessment Tool Used: AM-PAC 6 Clicks Basic Mobility    [name], PT Pager: [phone] 03/01/2017    [name], Gatha Mayer 03/01/2017, 11:01 AM

## 2017-03-02 ENCOUNTER — Encounter (HOSPITAL_COMMUNITY): Payer: Self-pay | Admitting: Orthopedic Surgery

## 2017-03-02 NOTE — Brief Op Note (Signed)
02/27/2017  1:41 PM  PATIENT:  [name]  56 y.o. female  PRE-OPERATIVE DIAGNOSIS:  RIGHT HIP OSTEOARTHRITIS   POST-OPERATIVE DIAGNOSIS:  RIGHT HIP OSTEOARTHRITIS   PROCEDURE:  Procedure(s) with comments: RIGHT TOTAL HIP ARTHROPLASTY ANTERIOR APPROACH (Right) - Panel Lenght: 120 mins  SURGEON:  Surgeon(s) and Role:    Dorna Leitz, MD - Primary  PHYSICIAN ASSISTANT:   ASSISTANTS: bethune   ANESTHESIA:   spinal  EBL:  Total I/O In: 1320 [P.O.:1200; I.V.:120] Out: 975 [Urine:975]  BLOOD ADMINISTERED:none  DRAINS: none   LOCAL MEDICATIONS USED:  MARCAINE    and OTHER experel  SPECIMEN:  No Specimen  DISPOSITION OF SPECIMEN:  N/A  COUNTS:  YES  TOURNIQUET:  * No tourniquets in log *  DICTATION: .Other Dictation: Dictation Number [id number]  PLAN OF CARE: Admit to inpatient   PATIENT DISPOSITION:  PACU - hemodynamically stable.   Delay start of Pharmacological VTE agent (>24hrs) due to surgical blood loss or risk of bleeding: no

## 2017-03-02 NOTE — Op Note (Deleted)
  The note originally documented on this encounter has been moved the the encounter in which it belongs.  

## 2017-03-02 NOTE — Op Note (Signed)
NAME:  [name], [name] NO.:  [id number]  MEDICAL RECORD NO.:  [id number]  LOCATION:                               FACILITY:  [facility]  PHYSICIAN:  Alta Corning, M.D.   DATE OF BIRTH:  03-25-1961  DATE OF PROCEDURE:  02/27/2017 DATE OF DISCHARGE:                              OPERATIVE REPORT   PREOPERATIVE DIAGNOSIS:  End-stage degenerative joint disease, right hip with significant and chronic ongoing pain.  POSTOPERATIVE DIAGNOSIS:  End-stage degenerative joint disease, right hip with significant and chronic ongoing pain.  PROCEDURE:  1.Right total hip replacement with a Corail stem size 11, 50- mm Pinnacle cup, a 32-mm delta ceramic hip ball +1, and a +4 neutral liner excepting of a 32-mm hip ball.2. Interpretation of multiple intraoperative fluoroscopic images  SURGEON:  Alta Corning, M.D.  ASSISTANT:  Gary Fleet, P.A.  ANESTHESIA:  Spinal.  BRIEF HISTORY:  [name] is the 56 year old female with a long history of significant complaints of right hip pain.  She has severe pain with internal-external rotation.  X-ray showed some narrowing, but certainly not bone-on-bone change.  We had prolonged workup including activity modification, anti-inflammatory medication, MRI examination and followed by intra-articular hip injection is only thing given her pain relief and she was having severe unrelenting pain prior to that and after failure of conservative care, intra-articular hip injection showing great relief.  She was taken to the operating room for right total hip replacement.  DESCRIPTION OF PROCEDURE:  The patient was taken to the operating room. After adequate anesthesia was obtained with spinal anesthetic, the patient was placed supine on the Hana bed.  She was then positioned, and prepped and draped.  Following this, an incision was made for an anterior approach to the hip, subcutaneous tissue down to the level of the tensor fascia, divided in  line with its fibers and the tensor fascia was retracted laterally.  Retractors were put in place above the hip joint and the hip capsule was opened and provisional neck cut made.  At this point, retractors were put in place for the acetabulum, it was identified sequentially reamed to a level of 49 mm, and a 50-mm Pinnacle cup was placed.  Once this was done, the cup was placed in 45 degrees of lateral opening and 30 degrees of anteversion.  This was confirmed by x- ray.  Hole eliminator was placed and the +4 neutral liner was placed. Attention was then turned to the stem side.  Retractors were put in place to elevate the leg and the hip was extended and externally rotated.  Following this, attention was turned towards opening the canal, was opened with a combination of a rongeur and cookie cutter, box cutter.  Once this was done, I sequentially rasped up to a level of 11 and 11 trial was placed.  Neutral head was then placed 32 mm and the hip reduced.  Anatomic reduction was achieved with symmetric leg length.  At this point, the size 11 trial was removed.  The final Corail standard offset stem was used with a size 11.  We re-trialed a +0 hip ball, and this gave [place] symmetric leg  length.  At this point, we opened and placed the final ceramic hip ball.  Again reduced fluoro images taken at this point to assess size and location, and attention then turned towards the final closure.  At this point, the capsule was closed with 1 Vicryl running with the tensor fascia was closed with 0 Vicryl running, and the skin was closed with 0 and 2-0 Vicryl and 3-0 Monocryl subcuticular. Benzoin and Steri-Strips were applied.  Sterile compressive dressing was applied.  The patient was taken to the recovery room and was noted to be in satisfactory condition.  Estimated blood loss for the procedure was approximately 300 mL with the final can be gotten from anesthetic record.  Of note, multiple intraoperative  fluoroscopic images were taken to assess fit, fill and leg length.     Alta Corning, M.D.   ______________________________ Alta Corning, M.D.    Corliss Skains  D:  03/02/2017  T:  03/02/2017  Job:  [id number]

## 2017-03-05 NOTE — Discharge Summary (Signed)
Patient ID: [name] MRN: [id number] DOB/AGE: [birth year] 56 y.o.  Admit date: 02/27/2017 Discharge date: 03/01/2017  Admission Diagnoses:  Principal Problem:   Primary osteoarthritis of right hip   Discharge Diagnoses:  Same  Past Medical History:  Diagnosis Date  . Anxiety   . Depression   . History of kidney stones   . Hyperlipidemia   . Hypertension   . OSA (obstructive sleep apnea) 03/09/2013   denies; does not use CPAP   . Pre-diabetes    last A1C in 2015 5.9% see 01-13-14 lab epic   . Sleep apnea   . Wound, breast    superficial appearing , reddened area to left upper breast; per patient, has been in sun recently , denies pain or drainage , "just itchy"     Surgeries: Procedure(s): RIGHT TOTAL HIP ARTHROPLASTY ANTERIOR APPROACH on 02/27/2017   Consultants: None  Discharged Condition: Improved  Hospital Course: [name] is an 56 y.o. female who was admitted 02/27/2017 for operative treatment of Primary osteoarthritis of right hip. Patient has severe unremitting pain that affects sleep, daily activities, and work/hobbies. After pre-op clearance the patient was taken to the operating room on 02/27/2017 and underwent  Procedure(s): RIGHT TOTAL HIP ARTHROPLASTY ANTERIOR APPROACH.    Patient was given perioperative antibiotics:  Anti-infectives    Start     Dose/Rate Route Frequency Ordered Stop   02/27/17 1800  ceFAZolin (ANCEF) IVPB 2g/100 mL premix     2 g 200 mL/hr over 30 Minutes Intravenous Every 6 hours 02/27/17 1550 02/28/17 0000   02/27/17 1016  ceFAZolin (ANCEF) IVPB 2g/100 mL premix     2 g 200 mL/hr over 30 Minutes Intravenous On call to O.R. 02/27/17 1016 02/27/17 1223       Patient was given sequential compression devices, early ambulation to prevent DVT.  Patient benefited maximally from hospital stay and there were no complications.    Recent vital signs: BP 109/65 (BP Location: Left Arm)   Pulse (!) 52   Temp (!) 97.3 F (36.3  C) (Oral)   Resp 18   Ht 5' 3.5" (1.613 m)   Wt 93 kg (205 lb)   SpO2 98%   BMI 35.74 kg/m    Discharge Medications:   Allergies as of 03/01/2017      Reactions   Cortisone Swelling   Hydrocodone Itching   Prednisone Swelling      Medication List    STOP taking these medications   ibuprofen 200 MG tablet Commonly known as:  ADVIL,MOTRIN   omeprazole 20 MG capsule Commonly known as:  PRILOSEC   oxyCODONE 5 MG immediate release tablet Commonly known as:  ROXICODONE   tamsulosin 0.4 MG Caps capsule Commonly known as:  FLOMAX     TAKE these medications   acetaminophen 500 MG tablet Commonly known as:  TYLENOL Take 500 mg by mouth every 6 (six) hours as needed for mild pain, moderate pain or headache.   alum & mag hydroxide-simeth 789-381-01 MG/5ML suspension Commonly known as:  MAALOX MAX Take 10 mLs by mouth every 6 (six) hours as needed for indigestion.   amLODipine 5 MG tablet Commonly known as:  NORVASC Take 5 mg by mouth daily.   amoxicillin 500 MG capsule Commonly known as:  AMOXIL Take 2 capsules (1,000 mg total) by mouth 3 (three) times daily.   aspirin EC 81 MG tablet Take 1 tablet (81 mg total) by mouth daily. What changed:  Another medication with  the same name was added. Make sure you understand how and when to take each.   aspirin EC 325 MG tablet Take 1 tablet (325 mg total) by mouth 2 (two) times daily after a meal. Take x 1 month post op to decrease risk of blood clots. What changed:  You were already taking a medication with the same name, and this prescription was added. Make sure you understand how and when to take each.   docusate sodium 100 MG capsule Commonly known as:  COLACE Take 1 capsule (100 mg total) by mouth 2 (two) times daily.   esomeprazole 20 MG capsule Commonly known as:  NEXIUM Take 20 mg by mouth daily.   ondansetron 4 MG disintegrating tablet Commonly known as:  ZOFRAN ODT Take 1 tablet (4 mg total) by mouth every 8  (eight) hours as needed for nausea or vomiting.   oxyCODONE-acetaminophen 5-325 MG tablet Commonly known as:  PERCOCET/ROXICET Take 1-2 tablets by mouth every 6 (six) hours as needed for severe pain.   promethazine 25 MG tablet Commonly known as:  PHENERGAN Take 25 mg by mouth every 6 (six) hours as needed (helps with hyperness). Take with pain medication.   tiZANidine 2 MG tablet Commonly known as:  ZANAFLEX Take 1 tablet (2 mg total) by mouth every 8 (eight) hours as needed for muscle spasms.   venlafaxine XR 75 MG 24 hr capsule Commonly known as:  EFFEXOR-XR Take 75 mg by mouth daily.   Vitamin D-3 5000 units Tabs Take 1 tablet by mouth every morning.       Diagnostic Studies: Dg C-arm 1-60 Min-no Report  Result Date: 02/27/2017 Fluoroscopy was utilized by the requesting physician.  No radiographic interpretation.    Disposition: 01-Home or Self Care  Discharge Instructions    Discharge patient    Complete by:  As directed    Pending PT clearance   Discharge disposition:  01-Home or Self Care   Discharge patient date:  02/28/2017   Discharge patient    Complete by:  As directed    Discharge disposition:  01-Home or Self Care   Discharge patient date:  03/01/2017     POD #2 s/p ANT R THR doing well with expected PO hip discomfort  - Up with PT/OT, encourage ambulation, pt informed of importance of ambulation and activity -HH set up - Potential lateral femoral cutaneous nerve root irritation           -Trial lidocaine cream  - Percocet for pain, Robaxin for muscle spasms -Scripts printed and signed in and chart for D/C ASA 325 BID x 4 weeks for DVT prophylaxis  -Script printed and signed in chart -Written scripts for pain signed and in chart -D/C instructions sheet printed and in chart -D/C today  -F/U in office 2 weeks   Signed: Justice Britain 03/05/2017, 11:49 AM

## 2017-03-31 ENCOUNTER — Other Ambulatory Visit: Payer: Self-pay | Admitting: Obstetrics and Gynecology

## 2017-03-31 DIAGNOSIS — Z1231 Encounter for screening mammogram for malignant neoplasm of breast: Secondary | ICD-10-CM

## 2017-05-04 ENCOUNTER — Ambulatory Visit
Admission: RE | Admit: 2017-05-04 | Discharge: 2017-05-04 | Disposition: A | Discharge status: Home / Self Care | Payer: PRIVATE HEALTH INSURANCE | Source: Ambulatory Visit | Attending: Obstetrics and Gynecology | Admitting: Obstetrics and Gynecology

## 2017-05-04 DIAGNOSIS — Z1231 Encounter for screening mammogram for malignant neoplasm of breast: Secondary | ICD-10-CM

## 2017-09-24 ENCOUNTER — Encounter (HOSPITAL_COMMUNITY): Payer: Self-pay

## 2017-12-29 ENCOUNTER — Other Ambulatory Visit: Payer: Self-pay | Admitting: Radiology

## 2017-12-29 DIAGNOSIS — Z1239 Encounter for other screening for malignant neoplasm of breast: Secondary | ICD-10-CM

## 2018-01-04 ENCOUNTER — Ambulatory Visit
Admission: RE | Admit: 2018-01-04 | Discharge: 2018-01-04 | Disposition: A | Discharge status: Home / Self Care | Payer: Self-pay | Source: Ambulatory Visit | Attending: Radiology | Admitting: Radiology

## 2018-01-04 DIAGNOSIS — Z1239 Encounter for other screening for malignant neoplasm of breast: Secondary | ICD-10-CM

## 2018-01-04 MED ORDER — GADOBUTROL 1 MMOL/ML IV SOLN
9.0000 mL | Freq: Once | INTRAVENOUS | Status: AC | PRN
  Administered 2018-01-04: 9 mL via INTRAVENOUS

## 2018-01-07 ENCOUNTER — Other Ambulatory Visit: Payer: Self-pay | Admitting: Obstetrics and Gynecology

## 2018-01-07 DIAGNOSIS — R928 Other abnormal and inconclusive findings on diagnostic imaging of breast: Secondary | ICD-10-CM

## 2018-01-11 ENCOUNTER — Ambulatory Visit
Admission: RE | Admit: 2018-01-11 | Discharge: 2018-01-11 | Disposition: A | Discharge status: Home / Self Care | Payer: PRIVATE HEALTH INSURANCE | Source: Ambulatory Visit | Attending: Obstetrics and Gynecology | Admitting: Obstetrics and Gynecology

## 2018-01-11 DIAGNOSIS — R928 Other abnormal and inconclusive findings on diagnostic imaging of breast: Secondary | ICD-10-CM

## 2018-01-11 MED ORDER — GADOBENATE DIMEGLUMINE 529 MG/ML IV SOLN
20.0000 mL | Freq: Once | INTRAVENOUS | Status: AC | PRN
  Administered 2018-01-11: 20 mL via INTRAVENOUS

## 2018-01-14 ENCOUNTER — Other Ambulatory Visit: Payer: Self-pay | Admitting: Surgery

## 2018-01-14 ENCOUNTER — Ambulatory Visit: Payer: Self-pay | Admitting: Surgery

## 2018-01-14 DIAGNOSIS — D241 Benign neoplasm of right breast: Secondary | ICD-10-CM

## 2018-01-28 NOTE — Pre-Procedure Instructions (Signed)
[name]  01/28/2018      [facility] - [address], [place] [address] Phone: [phone] Fax: [phone]    Your procedure is scheduled on Sept. 20  Report to [facility] Admitting at 5:30 A.M.  Call this number if you have problems the morning of surgery:  [phone]   Remember:  Do not eat after midnight.  You may drink clear liquids until 4:30 A.M. .  Clear liquids allowed are:                    Water, Juice (non-citric and without pulp), Carbonated beverages, Clear Tea, Black Coffee only, Plain Jell-O only, Gatorade and Plain Popsicles only    Take these medicines the morning of surgery with A SIP OF WATER :              Amlodipine(norvasc)             Pantoprazole(protonix)             Venlafaxine(effexor)                             7 days prior to surgery STOP taking any Aspirin(unless otherwise instructed by your surgeon), Aleve, Naproxen, Ibuprofen, Motrin, Advil, Goody's, BC's, all herbal medications, fish oil, and all vitamins    Do not wear jewelry, make-up or nail polish.  Do not wear lotions, powders, or perfumes, or deodorant.  Do not shave 48 hours prior to surgery.  Men may shave face and neck.  Do not bring valuables to the hospital.  [facility] is not responsible for any belongings or valuables.  Contacts, dentures or bridgework may not be worn into surgery.  Leave your suitcase in the car.  After surgery it may be brought to your room.  For patients admitted to the hospital, discharge time will be determined by your treatment team.  Patients discharged the day of surgery will not be allowed to drive home.    Special instructions:   - Preparing For Surgery  Before surgery, you can play an important role. Because skin is not sterile, your skin needs to be as free of germs as possible. You can reduce the number of germs on your skin by washing with CHG  (chlorahexidine gluconate) Soap before surgery.  CHG is an antiseptic cleaner which kills germs and bonds with the skin to continue killing germs even after washing.    Oral Hygiene is also important to reduce your risk of infection.  Remember - BRUSH YOUR TEETH THE MORNING OF SURGERY WITH YOUR REGULAR TOOTHPASTE  Please do not use if you have an allergy to CHG or antibacterial soaps. If your skin becomes reddened/irritated stop using the CHG.  Do not shave (including legs and underarms) for at least 48 hours prior to first CHG shower. It is OK to shave your face.  Please follow these instructions carefully.   1. Shower the NIGHT BEFORE SURGERY and the MORNING OF SURGERY with CHG.   2. If you chose to wash your hair, wash your hair first as usual with your normal shampoo.  3. After you shampoo, rinse your hair and body thoroughly to remove the shampoo.  4. Use CHG as you would any other liquid soap. You can apply CHG directly to the skin and wash gently with a scrungie or a clean  washcloth.   5. Apply the CHG Soap to your body ONLY FROM THE NECK DOWN.  Do not use on open wounds or open sores. Avoid contact with your eyes, ears, mouth and genitals (private parts). Wash Face and genitals (private parts)  with your normal soap.  6. Wash thoroughly, paying special attention to the area where your surgery will be performed.  7. Thoroughly rinse your body with warm water from the neck down.  8. DO NOT shower/wash with your normal soap after using and rinsing off the CHG Soap.  9. Pat yourself dry with a CLEAN TOWEL.  10. Wear CLEAN PAJAMAS to bed the night before surgery, wear comfortable clothes the morning of surgery  11. Place CLEAN SHEETS on your bed the night of your first shower and DO NOT SLEEP WITH PETS.    Day of Surgery:  Do not apply any deodorants/lotions.  Please wear clean clothes to the hospital/surgery center.   Remember to brush your teeth WITH YOUR REGULAR  TOOTHPASTE.    Please read over the following fact sheets that you were given. Coughing and Deep Breathing and Surgical Site Infection Prevention

## 2018-01-29 ENCOUNTER — Encounter (HOSPITAL_COMMUNITY): Payer: Self-pay

## 2018-01-29 ENCOUNTER — Other Ambulatory Visit: Payer: Self-pay

## 2018-01-29 ENCOUNTER — Encounter (HOSPITAL_COMMUNITY)
Admission: RE | Admit: 2018-01-29 | Discharge: 2018-01-29 | Disposition: A | Discharge status: Home / Self Care | Payer: PRIVATE HEALTH INSURANCE | Source: Ambulatory Visit | Attending: Surgery | Admitting: Surgery

## 2018-01-29 DIAGNOSIS — I444 Left anterior fascicular block: Secondary | ICD-10-CM | POA: Diagnosis not present

## 2018-01-29 DIAGNOSIS — D241 Benign neoplasm of right breast: Secondary | ICD-10-CM | POA: Insufficient documentation

## 2018-01-29 DIAGNOSIS — Z01818 Encounter for other preprocedural examination: Secondary | ICD-10-CM | POA: Insufficient documentation

## 2018-01-29 DIAGNOSIS — R9431 Abnormal electrocardiogram [ECG] [EKG]: Secondary | ICD-10-CM | POA: Insufficient documentation

## 2018-01-29 LAB — BASIC METABOLIC PANEL
Anion gap: 8 (ref 5–15)
BUN: 20 mg/dL (ref 6–20)
CHLORIDE: 110 mmol/L (ref 98–111)
CO2: 24 mmol/L (ref 22–32)
Calcium: 8.6 mg/dL — ABNORMAL LOW (ref 8.9–10.3)
Creatinine, Ser: 0.84 mg/dL (ref 0.44–1.00)
GFR calc non Af Amer: >60 mL/min (ref >60)
Glucose, Bld: 91 mg/dL (ref 70–99)
POTASSIUM: 4 mmol/L (ref 3.5–5.1)
SODIUM: 142 mmol/L (ref 135–145)

## 2018-01-29 LAB — CBC
HCT: 43 % (ref 36.0–46.0)
HEMOGLOBIN: 13.8 g/dL (ref 12.0–15.0)
MCH: 29.6 pg (ref 26.0–34.0)
MCHC: 32.1 g/dL (ref 30.0–36.0)
MCV: 92.1 fL (ref 78.0–100.0)
Platelets: 225 10*3/uL (ref 150–400)
RBC: 4.67 MIL/uL (ref 3.87–5.11)
RDW: 13.2 % (ref 11.5–15.5)
WBC: 7.2 10*3/uL (ref 4.0–10.5)

## 2018-01-29 NOTE — Progress Notes (Addendum)
PCP: Jani Gravel, MD  Cardiologist: pt denies  EKG: obtained at PAT appointment  Stress test: pt denies  ECHO: 8/2-17 in EPIC  Cardiac Cath: pt denies  Chest x-ray: denies past year

## 2018-02-04 ENCOUNTER — Ambulatory Visit
Admission: RE | Admit: 2018-02-04 | Discharge: 2018-02-04 | Disposition: A | Discharge status: Home / Self Care | Payer: PRIVATE HEALTH INSURANCE | Source: Ambulatory Visit | Attending: Surgery | Admitting: Surgery

## 2018-02-04 DIAGNOSIS — D241 Benign neoplasm of right breast: Secondary | ICD-10-CM

## 2018-02-04 NOTE — Anesthesia Preprocedure Evaluation (Addendum)
Anesthesia Evaluation  Patient identified by MRN, date of birth, ID band Patient awake    Reviewed: Allergy & Precautions, NPO status , Patient's Chart, lab work & pertinent test results  Airway Mallampati: I  TM Distance: >3 FB Neck ROM: Full    Dental no notable dental hx. (+) Teeth Intact, Dental Advisory Given   Pulmonary sleep apnea (doesn't use CPAP) , Current Smoker,    Pulmonary exam normal breath sounds clear to auscultation       Cardiovascular hypertension, Pt. on medications Normal cardiovascular exam Rhythm:Regular Rate:Normal  TTE 12/2015 EF 60-65%, no valvular abnormalities   Neuro/Psych Anxiety Depression negative neurological ROS     GI/Hepatic Neg liver ROS, GERD  Medicated,  Endo/Other  negative endocrine ROS  Renal/GU negative Renal ROS  negative genitourinary   Musculoskeletal  (+) Arthritis ,   Abdominal   Peds  Hematology negative hematology ROS (+)   Anesthesia Other Findings Right breast papilloma  Reproductive/Obstetrics                           Anesthesia Physical Anesthesia Plan  ASA: II  Anesthesia Plan: General   Post-op Pain Management:    Induction: Intravenous  PONV Risk Score and Plan: 2 and Midazolam, Dexamethasone and Ondansetron  Airway Management Planned: Oral ETT and LMA  Additional Equipment:   Intra-op Plan:   Post-operative Plan: Extubation in OR  Informed Consent: I have reviewed the patients History and Physical, chart, labs and discussed the procedure including the risks, benefits and alternatives for the proposed anesthesia with the patient or authorized representative who has indicated his/her understanding and acceptance.   Dental advisory given  Plan Discussed with: CRNA  Anesthesia Plan Comments:        Anesthesia Quick Evaluation

## 2018-02-04 NOTE — H&P (Signed)
[name]  Location: [facility] Surgery Patient #: [id number] DOB: 09/29/1960 Married / Language: English / Race: White Female  History of Present Illness   The patient is a 57 year old female who presents for a bariatric surgery evaluation.  Her PCP is Dr. Georges Mouse  She comes by herself.  I just saw her 12/24/2017. She had a sleeve gastrectomy on 02/20/2015.  [facility] got some new MRI equipment that the employs volunteered to try. She had the MRI on 04 January 2018 that showed a non mass enhancement in the medial central right breast. She underwent a biopsy of this area of her right breast on 01/11/2018 that showed a papilloma. They recommendation is to remove this area the breast. (She said that 20 employees volunteered - one other women was found to have cancer)  I gave her information on breast biopsy. She will need a seed localization of this area in her right breast. Will schedule the surgery.  For her sleeve -- Plan: 1) Protonix 40 mg BID (she was on Nexium, also a PPI, but we'll trying switching), 2) Nutirition consult, 3) Start food diary to follow what she is eating  History of weight problems (01/2015): She went to an information session that I presented. She is interested in a sleeve gastrectomy. The patient has been overweight much of her adult life. She has tried multiple diets including: Weight Watchers, Atkins, Slim fast, protein shakes, and Curves. She has tried [employer] program and over-the-counter diet pills.  Past Medical History: 1. Sleeve gastrectomy on 02/20/2015 - Avyaan Summer Initial weight - 226, BMI - 40.4 1. HTN x 7 years Off meds - 05/2015 2. Quit smoking Feb 2016 But has restarted some 3. OSA - has CPAP, but has claustrophobia and cannot wear the mask Off CPAP, though she did not use. 05/2015 4. Fatty liver 5. Hysterectomy 1984 6. Colonoscopy 2011/09/02 - not sure who did the  procedure 7. Back surgery - Dr. Carloyn Manner - 09-01-2009 Better, but still has pain 8. Parathyoidectomy 09-02-2006 - Gerkin resolved kidney stones that she was having 9. She had a work up for chest pain in around 09-01-2013 at [facility]. She is not sure if a cardiologist organized this or how this was organized.  Past Medical History: Married. Her husband is a Clinical cytogeneticist for [employer]. She works for [employer] in [place] 2 children: Robbie September 01, 2032 and Amber - 77 son died of a ruptured cerebral aneurysm in 2016/09/01   Allergies Sabino Gasser; 01/14/2018 11:27 AM) PredniSONE *CORTICOSTEROIDS*  Penicillins  Allergies Reconciled   Medication History Sabino Gasser; 01/14/2018 11:27 AM) Pantoprazole Sodium (40MG  Tablet DR, 1 (one) Oral two times daily, Taken starting 12/24/2017) Active. Cholecalciferol (50000UNIT Capsule, Oral) Active. Effexor XR (37.5MG  Capsule ER 24HR, Oral) Active. Cyanocobalamin (1000MCG Capsule, Oral) Active. Multivitamin Adult (Oral) Active. Medications Reconciled  Vitals Sabino Gasser; 01/14/2018 11:28 AM) 01/14/2018 11:27 AM Weight: 208.13 lb Height: 62.75in Body Surface Area: 1.96 m Body Mass Index: 37.16 kg/m  Temp.: 98.87F(Oral)  Pulse: 76 (Regular)  BP: 122/80 (Sitting, Left Arm, Standard)  Physical Exam  General: WN obese WF alert and generally healthy appearing. HEENT: Normal. Pupils equal.  Neck: Supple. No mass. No thyroid mass.  Lymph Nodes: No supraclavicular or cervical nodes.  Lungs: Clear to auscultation and symmetric breath sounds. Heart: RRR. No murmur or rub.  Abdomen: Soft. No mass. No tenderness. No hernia. Normal bowel sounds.  Incisions look good. She is having no abdominal pain.  Extremities: Good strength and ROM in upper and lower extremities.  Assessment & Plan  1.  PAPILLOMA OF RIGHT BREAST (D24.1)  Plan  1) Right breast lumpectomy with seed localization  2.  HISTORY OF SLEEVE  GASTRECTOMY (Z90.3)  Story: Sleeve gastrectomy - 02/20/2015 - D. Kristian Hazzard   Initial weight - 226, BMI - 40.4  Plan:  1) Protonix 40 mg BID  2) Nutirition consult  3) Start food diary to follow what she is eating  3.  MORBID OBESITY WITH BMI OF 40.0-44.9, ADULT (E66.01)  Story: Sleeve gastrectomy - 02/20/2015 - D. Kolina Kube  4. HTN x 7 years Off meds - 05/2015 5. Quit smoking Feb 2016 But has restarted some 6. OSA - has CPAP, but has claustrophobia and cannot wear the mask Off CPAP, though she did not use. 05/2015 7. Fatty liver 8. Back surgery - Dr. Carloyn Manner - 2011 Better, but still has pain 9. Parathyoidectomy - 2008 - Gerkin resolved kidney stones that she was having   Alphonsa Overall, MD, [facility] Surgery Pager: [phone] Office phone:  [phone]

## 2018-02-05 ENCOUNTER — Encounter (HOSPITAL_COMMUNITY): Payer: Self-pay | Admitting: *Deleted

## 2018-02-05 ENCOUNTER — Encounter (HOSPITAL_COMMUNITY)
Admission: RE | Disposition: A | Discharge status: Home / Self Care | Payer: Self-pay | Source: Ambulatory Visit | Attending: Surgery

## 2018-02-05 ENCOUNTER — Ambulatory Visit (HOSPITAL_COMMUNITY): Payer: PRIVATE HEALTH INSURANCE | Admitting: Anesthesiology

## 2018-02-05 ENCOUNTER — Ambulatory Visit (HOSPITAL_COMMUNITY)
Admission: RE | Admit: 2018-02-05 | Discharge: 2018-02-05 | Disposition: A | Discharge status: Home / Self Care | Payer: PRIVATE HEALTH INSURANCE | Source: Ambulatory Visit | Attending: Surgery | Admitting: Surgery

## 2018-02-05 ENCOUNTER — Ambulatory Visit
Admission: RE | Admit: 2018-02-05 | Discharge: 2018-02-05 | Disposition: A | Discharge status: Home / Self Care | Payer: PRIVATE HEALTH INSURANCE | Source: Ambulatory Visit | Attending: Surgery | Admitting: Surgery

## 2018-02-05 DIAGNOSIS — D241 Benign neoplasm of right breast: Secondary | ICD-10-CM | POA: Diagnosis not present

## 2018-02-05 DIAGNOSIS — M1990 Unspecified osteoarthritis, unspecified site: Secondary | ICD-10-CM | POA: Diagnosis not present

## 2018-02-05 DIAGNOSIS — K760 Fatty (change of) liver, not elsewhere classified: Secondary | ICD-10-CM | POA: Diagnosis not present

## 2018-02-05 DIAGNOSIS — Z87891 Personal history of nicotine dependence: Secondary | ICD-10-CM | POA: Insufficient documentation

## 2018-02-05 DIAGNOSIS — K219 Gastro-esophageal reflux disease without esophagitis: Secondary | ICD-10-CM | POA: Diagnosis not present

## 2018-02-05 DIAGNOSIS — Z9884 Bariatric surgery status: Secondary | ICD-10-CM | POA: Diagnosis not present

## 2018-02-05 DIAGNOSIS — Z6841 Body Mass Index (BMI) 40.0 and over, adult: Secondary | ICD-10-CM | POA: Insufficient documentation

## 2018-02-05 DIAGNOSIS — G4733 Obstructive sleep apnea (adult) (pediatric): Secondary | ICD-10-CM | POA: Diagnosis not present

## 2018-02-05 DIAGNOSIS — I10 Essential (primary) hypertension: Secondary | ICD-10-CM | POA: Insufficient documentation

## 2018-02-05 HISTORY — PX: BREAST LUMPECTOMY WITH RADIOACTIVE SEED LOCALIZATION: SHX6424

## 2018-02-05 SURGERY — BREAST LUMPECTOMY WITH RADIOACTIVE SEED LOCALIZATION
Anesthesia: General | Site: Breast | Laterality: Right

## 2018-02-05 MED ORDER — BUPIVACAINE-EPINEPHRINE (PF) 0.25% -1:200000 IJ SOLN
INTRAMUSCULAR | Status: AC
  Filled 2018-02-05: qty 30

## 2018-02-05 MED ORDER — MIDAZOLAM HCL 2 MG/2ML IJ SOLN
INTRAMUSCULAR | Status: AC
  Filled 2018-02-05: qty 2

## 2018-02-05 MED ORDER — PROPOFOL 10 MG/ML IV BOLUS
INTRAVENOUS | Status: DC | PRN
  Administered 2018-02-05: 120 mg via INTRAVENOUS

## 2018-02-05 MED ORDER — MIDAZOLAM HCL 2 MG/2ML IJ SOLN
INTRAMUSCULAR | Status: DC | PRN
  Administered 2018-02-05: 2 mg via INTRAVENOUS

## 2018-02-05 MED ORDER — PHENYLEPHRINE 40 MCG/ML (10ML) SYRINGE FOR IV PUSH (FOR BLOOD PRESSURE SUPPORT)
PREFILLED_SYRINGE | INTRAVENOUS | Status: DC | PRN
  Administered 2018-02-05 (×2): 80 ug via INTRAVENOUS

## 2018-02-05 MED ORDER — LACTATED RINGERS IV SOLN
INTRAVENOUS | Status: DC | PRN
  Administered 2018-02-05: 08:00:00 via INTRAVENOUS

## 2018-02-05 MED ORDER — EPHEDRINE 5 MG/ML INJ
INTRAVENOUS | Status: AC
  Filled 2018-02-05: qty 10

## 2018-02-05 MED ORDER — 0.9 % SODIUM CHLORIDE (POUR BTL) OPTIME
TOPICAL | Status: DC | PRN
  Administered 2018-02-05: 1000 mL

## 2018-02-05 MED ORDER — EPHEDRINE SULFATE-NACL 50-0.9 MG/10ML-% IV SOSY
PREFILLED_SYRINGE | INTRAVENOUS | Status: DC | PRN
  Administered 2018-02-05 (×2): 10 mg via INTRAVENOUS
  Administered 2018-02-05: 15 mg via INTRAVENOUS
  Administered 2018-02-05: 5 mg via INTRAVENOUS

## 2018-02-05 MED ORDER — PROPOFOL 10 MG/ML IV BOLUS
INTRAVENOUS | Status: AC
  Filled 2018-02-05: qty 20

## 2018-02-05 MED ORDER — CEFAZOLIN SODIUM-DEXTROSE 2-4 GM/100ML-% IV SOLN
2.0000 g | INTRAVENOUS | Status: AC
  Administered 2018-02-05: 2 g via INTRAVENOUS
  Filled 2018-02-05: qty 100

## 2018-02-05 MED ORDER — LIDOCAINE 2% (20 MG/ML) 5 ML SYRINGE
INTRAMUSCULAR | Status: AC
  Filled 2018-02-05: qty 5

## 2018-02-05 MED ORDER — ONDANSETRON HCL 4 MG/2ML IJ SOLN
INTRAMUSCULAR | Status: DC | PRN
  Administered 2018-02-05: 4 mg via INTRAVENOUS

## 2018-02-05 MED ORDER — CHLORHEXIDINE GLUCONATE CLOTH 2 % EX PADS: 6.0000 | MEDICATED_PAD | Freq: Once | CUTANEOUS | Status: DC

## 2018-02-05 MED ORDER — FENTANYL CITRATE (PF) 100 MCG/2ML IJ SOLN: 25.0000 ug | INTRAMUSCULAR | Status: DC | PRN

## 2018-02-05 MED ORDER — FENTANYL CITRATE (PF) 250 MCG/5ML IJ SOLN
INTRAMUSCULAR | Status: AC
  Filled 2018-02-05: qty 5

## 2018-02-05 MED ORDER — FENTANYL CITRATE (PF) 250 MCG/5ML IJ SOLN
INTRAMUSCULAR | Status: DC | PRN
  Administered 2018-02-05: 25 ug via INTRAVENOUS
  Administered 2018-02-05: 100 ug via INTRAVENOUS
  Administered 2018-02-05: 25 ug via INTRAVENOUS

## 2018-02-05 MED ORDER — BUPIVACAINE-EPINEPHRINE 0.25% -1:200000 IJ SOLN
INTRAMUSCULAR | Status: DC | PRN
  Administered 2018-02-05: 30 mL

## 2018-02-05 MED ORDER — LIDOCAINE 2% (20 MG/ML) 5 ML SYRINGE
INTRAMUSCULAR | Status: DC | PRN
  Administered 2018-02-05: 60 mg via INTRAVENOUS

## 2018-02-05 MED ORDER — ACETAMINOPHEN 500 MG PO TABS
1000.0000 mg | ORAL_TABLET | ORAL | Status: AC
  Administered 2018-02-05: 1000 mg via ORAL
  Filled 2018-02-05: qty 2

## 2018-02-05 MED ORDER — GABAPENTIN 300 MG PO CAPS
300.0000 mg | ORAL_CAPSULE | ORAL | Status: AC
  Administered 2018-02-05: 300 mg via ORAL
  Filled 2018-02-05: qty 1

## 2018-02-05 MED ORDER — TRAMADOL HCL 50 MG PO TABS: 50.0000 mg | ORAL_TABLET | Freq: Four times a day (QID) | ORAL | 0 refills | Status: AC | PRN

## 2018-02-05 MED ORDER — PHENYLEPHRINE 40 MCG/ML (10ML) SYRINGE FOR IV PUSH (FOR BLOOD PRESSURE SUPPORT)
PREFILLED_SYRINGE | INTRAVENOUS | Status: AC
  Filled 2018-02-05: qty 10

## 2018-02-05 MED ORDER — ONDANSETRON HCL 4 MG/2ML IJ SOLN
INTRAMUSCULAR | Status: AC
  Filled 2018-02-05: qty 2

## 2018-02-05 SURGICAL SUPPLY — 43 items
BINDER BREAST LRG (GAUZE/BANDAGES/DRESSINGS) IMPLANT
BINDER BREAST XLRG (GAUZE/BANDAGES/DRESSINGS) ×3 IMPLANT
BLADE SURG 15 STRL LF DISP TIS (BLADE) ×1 IMPLANT
BLADE SURG 15 STRL SS (BLADE) ×2
CANISTER SUCT 3000ML PPV (MISCELLANEOUS) IMPLANT
CHLORAPREP W/TINT 26ML (MISCELLANEOUS) ×3 IMPLANT
CLIP VESOCCLUDE SM WIDE 6/CT (CLIP) ×3 IMPLANT
COVER PROBE W GEL 5X96 (DRAPES) ×3 IMPLANT
COVER SURGICAL LIGHT HANDLE (MISCELLANEOUS) ×3 IMPLANT
DECANTER SPIKE VIAL GLASS SM (MISCELLANEOUS) ×3 IMPLANT
DERMABOND ADVANCED (GAUZE/BANDAGES/DRESSINGS) ×2
DERMABOND ADVANCED .7 DNX12 (GAUZE/BANDAGES/DRESSINGS) ×1 IMPLANT
DEVICE DUBIN SPECIMEN MAMMOGRA (MISCELLANEOUS) ×3 IMPLANT
DRAPE CHEST BREAST 15X10 FENES (DRAPES) ×3 IMPLANT
DRAPE UTILITY XL STRL (DRAPES) ×3 IMPLANT
DRSG PAD ABDOMINAL 8X10 ST (GAUZE/BANDAGES/DRESSINGS) ×3 IMPLANT
ELECT COATED BLADE 2.86 ST (ELECTRODE) ×3 IMPLANT
ELECT REM PT RETURN 9FT ADLT (ELECTROSURGICAL) ×3
ELECTRODE REM PT RTRN 9FT ADLT (ELECTROSURGICAL) ×1 IMPLANT
GAUZE SPONGE 4X4 12PLY STRL (GAUZE/BANDAGES/DRESSINGS) ×3 IMPLANT
GLOVE SURG SIGNA 7.5 PF LTX (GLOVE) ×3 IMPLANT
GOWN STRL REUS W/ TWL LRG LVL3 (GOWN DISPOSABLE) ×1 IMPLANT
GOWN STRL REUS W/ TWL XL LVL3 (GOWN DISPOSABLE) ×1 IMPLANT
GOWN STRL REUS W/TWL LRG LVL3 (GOWN DISPOSABLE) ×2
GOWN STRL REUS W/TWL XL LVL3 (GOWN DISPOSABLE) ×2
ILLUMINATOR WAVEGUIDE N/F (MISCELLANEOUS) IMPLANT
KIT BASIN OR (CUSTOM PROCEDURE TRAY) ×3 IMPLANT
KIT MARKER MARGIN INK (KITS) ×3 IMPLANT
LIGHT WAVEGUIDE WIDE FLAT (MISCELLANEOUS) IMPLANT
NEEDLE HYPO 25GX1X1/2 BEV (NEEDLE) ×3 IMPLANT
NS IRRIG 1000ML POUR BTL (IV SOLUTION) ×3 IMPLANT
PACK SURGICAL SETUP 50X90 (CUSTOM PROCEDURE TRAY) ×3 IMPLANT
PENCIL BUTTON HOLSTER BLD 10FT (ELECTRODE) ×3 IMPLANT
SPONGE LAP 18X18 X RAY DECT (DISPOSABLE) ×3 IMPLANT
SUT MNCRL AB 4-0 PS2 18 (SUTURE) ×3 IMPLANT
SUT VIC AB 3-0 SH 18 (SUTURE) ×3 IMPLANT
SYR BULB 3OZ (MISCELLANEOUS) ×3 IMPLANT
SYR CONTROL 10ML LL (SYRINGE) ×3 IMPLANT
TOWEL OR 17X24 6PK STRL BLUE (TOWEL DISPOSABLE) ×3 IMPLANT
TOWEL OR 17X26 10 PK STRL BLUE (TOWEL DISPOSABLE) ×3 IMPLANT
TUBE CONNECTING 12'X1/4 (SUCTIONS) ×1
TUBE CONNECTING 12X1/4 (SUCTIONS) ×2 IMPLANT
YANKAUER SUCT BULB TIP NO VENT (SUCTIONS) ×3 IMPLANT

## 2018-02-05 NOTE — Transfer of Care (Signed)
Immediate Anesthesia Transfer of Care Note  Patient: [name]  Procedure(s) Performed: BREAST LUMPECTOMY WITH RADIOACTIVE SEED LOCALIZATION (Right Breast)  Patient Location: PACU  Anesthesia Type:General  Level of Consciousness: awake, alert  and oriented  Airway & Oxygen Therapy: Patient Spontanous Breathing  Post-op Assessment: Report given to RN and Post -op Vital signs reviewed and stable  Post vital signs: Reviewed and stable  Last Vitals:  Vitals Value Taken Time  BP    Temp    Pulse    Resp    SpO2      Last Pain:  Vitals:   02/05/18 0926  TempSrc:   PainSc: 0-No pain         Complications: No apparent anesthesia complications

## 2018-02-05 NOTE — Anesthesia Postprocedure Evaluation (Signed)
Anesthesia Post Note  Patient: [name]  Procedure(s) Performed: BREAST LUMPECTOMY WITH RADIOACTIVE SEED LOCALIZATION (Right Breast)     Patient location during evaluation: PACU Anesthesia Type: General Level of consciousness: awake and alert Pain management: pain level controlled Vital Signs Assessment: post-procedure vital signs reviewed and stable Respiratory status: spontaneous breathing, nonlabored ventilation, respiratory function stable and patient connected to nasal cannula oxygen Cardiovascular status: blood pressure returned to baseline and stable Postop Assessment: no apparent nausea or vomiting Anesthetic complications: no    Last Vitals:  Vitals:   02/05/18 0926 02/05/18 0944  BP: 125/68 (!) 147/85  Pulse: 67 67  Resp: 17 18  Temp: (!) 36.2 C   SpO2: 94% 98%    Last Pain:  Vitals:   02/05/18 0944  TempSrc:   PainSc: 0-No pain                 Joceline Hinchcliff L Cevin Rubinstein

## 2018-02-05 NOTE — Op Note (Signed)
02/05/2018  8:45 AM  PATIENT:  [name] DOB: May 09, 1961 MRN: [id number]  PREOP DIAGNOSIS:   RIGHT BREAST PAPILLOMA  POSTOP DIAGNOSIS:    Right breast papilloma, 2 o'clock position   PROCEDURE:   Procedure(s):  Right BREAST LUMPECTOMY WITH RADIOACTIVE SEED LOCALIZATION  SURGEON:   Alphonsa Overall, M.D.  ANESTHESIA:   general  Anesthesiologist: Freddrick March, MD CRNA: Marsa Aris, CRNA  General  EBL:  minimal  ml  DRAINS:  none   LOCAL MEDICATIONS USED:   30 cc 1/4% Marcaine  SPECIMEN:   Right breast lumpectomy  COUNTS CORRECT:  YES  INDICATIONS FOR PROCEDURE:  [name] is a 57 y.o. (DOB: 12/13/1960) white female whose primary care physician is Jani Gravel, MD and comes for right breast lumpectomy.   She had a biopsy of the right breat which showed a papilloma on pathology.  She comes for excision of this lesion.     The indications and potential complications of surgery were explained to the patient. Potential complications include, but are not limited to, bleeding, infection, the need for further surgery, and nerve injury.     She had a I131 seed placed on 02/04/2018 in her right breast at [facility].  The seed is in the 2 o'clock position of the right breast.     OPERATIVE NOTE:   The patient was taken to operating room # 2 at [facility] where she underwent a general anesthesia  supervised by Anesthesiologist: Freddrick March, MD CRNA: Marsa Aris, CRNA. Her right breast and axilla were prepped with  ChloraPrep and sterilely draped.    A time-out and the surgical check list was reviewed.    The seed is at the 2 o'clock position of the right breast.  I made a medial circumareolar incision to get to the mass.    I used the Neoprobe to identify the I131 seed.  I tried to excise an area around the tumor of at least 1 cm.  She also had a hematoma medial to the clip that I excised.   I excised this block of breast tissue approximately 3 cm by 3 cm  in  diameter.   I painted the lumpectomy specimen with the 6 color paint kit and did a specimen mammogram which confirmed the mass, clip, and the seed were all in the right position in the specimen.  The specimen was sent to pathology who called back to confirm that they have the seed and the specimen.   I then irrigated the wound with saline. I infiltrated approximately 30 mL of 1/4% Marcaine in the incision. I then closed the wound in layers using 3-0 Vicryl sutures for the deep layer. At the skin, I closed the incisions with a 4-0 Monocryl suture. The incision was then painted with Dermabond.  She had gauze place over the wounds and placed in a breast binder.   The patient tolerated the procedure well, was transported to the recovery room in good condition. Sponge and needle count were correct at the end of the case.   Final pathology is pending.   Alphonsa Overall, MD, [facility] Pager: [phone] Office phone:  [phone]

## 2018-02-05 NOTE — Anesthesia Procedure Notes (Signed)
Procedure Name: LMA Insertion Date/Time: 02/05/2018 7:42 AM Performed by: Marsa Aris, CRNA Pre-anesthesia Checklist: Patient identified, Emergency Drugs available, Suction available, Patient being monitored and Timeout performed Patient Re-evaluated:Patient Re-evaluated prior to induction Oxygen Delivery Method: Circle System Utilized Preoxygenation: Pre-oxygenation with 100% oxygen Induction Type: IV induction Ventilation: Mask ventilation without difficulty LMA: LMA inserted LMA Size: 4.0 Number of attempts: 1 Airway Equipment and Method: Bite block Placement Confirmation: positive ETCO2 Tube secured with: Tape Dental Injury: Teeth and Oropharynx as per pre-operative assessment

## 2018-02-05 NOTE — Discharge Instructions (Signed)
[facility] [place] [facility] - DISCHARGE INSTRUCTIONS TO PATIENT  Return to work on:  02/10/2018  Activity:  Driving - May drive in one or two days, if off pain meds   Lifting - No lifting more than 15 pounds for 5 days, then no limit  Wound Care:   Leave bandage on for 2 days, then remove and shower  Diet:  As tolerated  Follow up appointment:  Call Dr. Pollie Friar office [facility]) at [phone] for an appointment in 2 to 3 weeks.  Medications and dosages:  Resume your home medications.  You have a prescription for:  Vicodin  Call Dr. Lucia Gaskins or his office  [phone]) if you have:  Temperature greater than 100.4,  Persistent nausea and vomiting,  Severe uncontrolled pain,  Redness, tenderness, or signs of infection (pain, swelling, redness, odor or green/yellow discharge around the site),  Difficulty breathing, headache or visual disturbances,  Any other questions or concerns you may have after discharge.  In an emergency, call 911 or go to an Emergency Department at a nearby hospital.

## 2018-02-06 ENCOUNTER — Encounter (HOSPITAL_COMMUNITY): Payer: Self-pay | Admitting: Surgery

## 2018-04-07 ENCOUNTER — Other Ambulatory Visit: Payer: Self-pay | Admitting: Internal Medicine

## 2018-04-07 DIAGNOSIS — R1031 Right lower quadrant pain: Secondary | ICD-10-CM

## 2018-04-07 DIAGNOSIS — R1084 Generalized abdominal pain: Secondary | ICD-10-CM

## 2018-04-07 DIAGNOSIS — R1013 Epigastric pain: Secondary | ICD-10-CM

## 2018-04-08 ENCOUNTER — Ambulatory Visit
Admission: RE | Admit: 2018-04-08 | Discharge: 2018-04-08 | Disposition: A | Discharge status: Home / Self Care | Payer: PRIVATE HEALTH INSURANCE | Source: Ambulatory Visit | Attending: Internal Medicine | Admitting: Internal Medicine

## 2018-04-08 DIAGNOSIS — R1013 Epigastric pain: Secondary | ICD-10-CM

## 2018-04-08 DIAGNOSIS — R1031 Right lower quadrant pain: Secondary | ICD-10-CM

## 2018-04-08 DIAGNOSIS — R1084 Generalized abdominal pain: Secondary | ICD-10-CM

## 2018-04-08 MED ORDER — IOPAMIDOL (ISOVUE-300) INJECTION 61%
100.0000 mL | Freq: Once | INTRAVENOUS | Status: AC | PRN
  Administered 2018-04-08: 100 mL via INTRAVENOUS

## 2018-04-09 ENCOUNTER — Other Ambulatory Visit: Payer: PRIVATE HEALTH INSURANCE

## 2018-05-07 ENCOUNTER — Ambulatory Visit: Payer: PRIVATE HEALTH INSURANCE | Admitting: Internal Medicine

## 2018-05-11 ENCOUNTER — Ambulatory Visit: Payer: PRIVATE HEALTH INSURANCE | Admitting: Physician Assistant

## 2018-06-21 ENCOUNTER — Encounter (HOSPITAL_COMMUNITY): Payer: Self-pay | Admitting: Emergency Medicine

## 2018-06-21 ENCOUNTER — Other Ambulatory Visit: Payer: Self-pay

## 2018-06-21 ENCOUNTER — Ambulatory Visit (HOSPITAL_COMMUNITY)
Admission: EM | Admit: 2018-06-21 | Discharge: 2018-06-21 | Disposition: A | Discharge status: Home / Self Care | Payer: PRIVATE HEALTH INSURANCE | Attending: Family Medicine | Admitting: Family Medicine

## 2018-06-21 DIAGNOSIS — J069 Acute upper respiratory infection, unspecified: Secondary | ICD-10-CM

## 2018-06-21 DIAGNOSIS — B9789 Other viral agents as the cause of diseases classified elsewhere: Secondary | ICD-10-CM

## 2018-06-21 MED ORDER — HYDROCODONE-HOMATROPINE 5-1.5 MG/5ML PO SYRP: 5.0000 mL | ORAL_SOLUTION | Freq: Four times a day (QID) | ORAL | 0 refills | Status: DC | PRN

## 2018-06-21 MED ORDER — GUAIFENESIN ER 600 MG PO TB12: 600.0000 mg | ORAL_TABLET | Freq: Two times a day (BID) | ORAL | 0 refills | Status: DC

## 2018-06-21 MED ORDER — NAPROXEN 500 MG PO TABS: 500.0000 mg | ORAL_TABLET | Freq: Two times a day (BID) | ORAL | 0 refills | Status: DC

## 2018-06-21 NOTE — Discharge Instructions (Signed)
I believe he had a viral upper respiratory infection I am giving you a prescription for Hycodan cough syrup to help with the cough.  Be aware this will make you drowsy. Mucinex for cough, congestion and to thin mucus.  He can take this during the day. Naproxen twice a day for pain, body aches Follow up as needed for continued or worsening symptoms

## 2018-06-21 NOTE — ED Triage Notes (Signed)
Patient complains of aches and pain, chills, body aches.  Onset of symptoms was saturday

## 2018-06-21 NOTE — ED Provider Notes (Signed)
[place]    CSN: [id number] Arrival date & time: 06/21/18  0840     History   Chief Complaint Chief Complaint  Patient presents with  . URI    HPI [name] is a 58 y.o. female.   Patient is a 58 year old female with past medical history of anxiety, depression, hyperlipidemia, thyroid disease, hypertension, pre-diabetes, OSA. She presents today with 3 days of cough, congestion, low-grade fever, myalgias.  Symptoms have been constant and remain the same.  She has been using Tylenol cold and sinus without much relief.  She is a current everyday smoker.  She has had a few recent sick contacts.  No recent traveling.  ROS per HPI      Past Medical History:  Diagnosis Date  . Anxiety   . Depression   . Fatty liver   . History of kidney stones   . Hyperlipidemia   . Hyperparathyroidism ([place])   . Hypertension   . Nephrolithiasis   . Obesity   . OSA (obstructive sleep apnea) 03/09/2013   denies; does not use CPAP   . Pre-diabetes    last A1C in 2015 5.9% see 01-13-14 lab epic   . Sleep apnea   . Vitamin D deficiency   . Wound, breast    superficial appearing , reddened area to left upper breast; per patient, has been in sun recently , denies pain or drainage , "just itchy"     Patient Active Problem List   Diagnosis Date Noted  . Primary osteoarthritis of right hip 02/27/2017  . Morbid obesity ([place]) 02/20/2015  . Chest pain, unspecified 01/13/2014  . Chest pain 01/13/2014  . Abnormal EKG- inferior TWI 01/13/2014  . OSA (obstructive sleep apnea) 03/09/2013    Past Surgical History:  Procedure Laterality Date  . BACK SURGERY    . BREAST LUMPECTOMY WITH RADIOACTIVE SEED LOCALIZATION Right 02/05/2018   Procedure: BREAST LUMPECTOMY WITH RADIOACTIVE SEED LOCALIZATION;  Surgeon: Alphonsa Overall, MD;  Location: [facility];  Service: General;  Laterality: Right;  . BREATH TEK H PYLORI N/A 08/31/2014   Procedure: BREATH TEK H PYLORI;  Surgeon: Alphonsa Overall, MD;   Location: [facility];  Service: General;  Laterality: N/A;  . LAPAROSCOPIC GASTRIC SLEEVE RESECTION N/A 02/20/2015   Procedure: LAPAROSCOPIC GASTRIC SLEEVE RESECTION;  Surgeon: Alphonsa Overall, MD;  Location: [facility];  Service: General;  Laterality: N/A;  . PARTIAL HYSTERECTOMY    . sleep study  04/08/13  . THYROID SURGERY    . TOTAL HIP ARTHROPLASTY Right 02/27/2017   Procedure: RIGHT TOTAL HIP ARTHROPLASTY ANTERIOR APPROACH;  Surgeon: Dorna Leitz, MD;  Location: [facility];  Service: Orthopedics;  Laterality: Right;  Panel Lenght: 120 mins    OB History   No obstetric history on file.      Home Medications    Prior to Admission medications   Medication Sig Start Date End Date Taking? Authorizing Provider  amLODipine (NORVASC) 5 MG tablet Take 5 mg by mouth daily.    [provider]  aspirin EC 81 MG tablet Take 1 tablet (81 mg total) by mouth daily. Patient not taking: Reported on 02/09/2015 01/13/14   Kelvin Cellar, MD  Cholecalciferol (VITAMIN D-3) 5000 UNITS TABS Take 5,000 Units by mouth every morning.     [provider]  guaiFENesin (MUCINEX) 600 MG 12 hr tablet Take 1 tablet (600 mg total) by mouth 2 (two) times daily. 06/21/18   Orvan July, NP  HYDROcodone-homatropine (HYCODAN) 5-1.5 MG/5ML syrup  Take 5 mLs by mouth every 6 (six) hours as needed for cough. 06/21/18   Loura Halt A, NP  naproxen (NAPROSYN) 500 MG tablet Take 1 tablet (500 mg total) by mouth 2 (two) times daily. 06/21/18   Loura Halt A, NP  pantoprazole (PROTONIX) 40 MG tablet Take 40 mg by mouth 2 (two) times daily.    [provider]  traMADol (ULTRAM) 50 MG tablet Take 1 tablet (50 mg total) by mouth every 6 (six) hours as needed for moderate pain. 02/05/18 02/05/19  Alphonsa Overall, MD  venlafaxine XR (EFFEXOR-XR) 75 MG 24 hr capsule Take 75 mg by mouth daily. 12/11/16   [provider]    Family History Family History  Problem Relation Age of Onset  . COPD Mother   .  Hypertension Mother   . Heart disease Mother   . Heart attack Mother   . Heart attack Father        died age 69 with the heart attack  . Heart disease Father   . Hypertension Sister   . Hypertension Brother   . Hypertension Sister   . Other Son        died age 47, autopsy pending possible aneurysm  . Colon cancer Neg Hx   . Breast cancer Neg Hx     Social History Social History   Tobacco Use  . Smoking status: Current Every Day Smoker    Packs/day: 0.25    Years: 20.00    Pack years: 5.00    Types: Cigarettes  . Smokeless tobacco: Never Used  Substance Use Topics  . Alcohol use: No    Alcohol/week: 0.0 standard drinks  . Drug use: No     Allergies   Cortisone; Prednisone; and Hydrocodone   Review of Systems Review of Systems   Physical Exam Triage Vital Signs ED Triage Vitals  Enc Vitals Group     BP 06/21/18 0850 127/77     Pulse Rate 06/21/18 0850 68     Resp 06/21/18 0850 18     Temp 06/21/18 0850 98.1 F (36.7 C)     Temp Source 06/21/18 0850 Oral     SpO2 06/21/18 0850 100 %     Weight --      Height --      Head Circumference --      Peak Flow --      Pain Score 06/21/18 0907 8     Pain Loc --      Pain Edu? --      Excl. in [place]? --    No data found.  Updated Vital Signs BP 127/77 (BP Location: Left Arm)   Pulse 68   Temp 98.1 F (36.7 C) (Oral)   Resp 18   SpO2 100%   Visual Acuity Right Eye Distance:   Left Eye Distance:   Bilateral Distance:    Right Eye Near:   Left Eye Near:    Bilateral Near:     Physical Exam Vitals signs and nursing note reviewed.  Constitutional:      General: She is not in acute distress.    Appearance: Normal appearance. She is well-developed. She is not ill-appearing.  HENT:     Head: Normocephalic and atraumatic.     Right Ear: Tympanic membrane and ear canal normal.     Left Ear: Tympanic membrane and ear canal normal.     Nose: Congestion present.     Mouth/Throat:     Pharynx: Oropharynx  is  clear.  Eyes:     Conjunctiva/sclera: Conjunctivae normal.  Neck:     Musculoskeletal: Normal range of motion and neck supple.  Cardiovascular:     Rate and Jennifer: Normal rate and regular Jennifer.     Heart sounds: No murmur.  Pulmonary:     Effort: Pulmonary effort is normal. No respiratory distress.     Breath sounds: Normal breath sounds.  Musculoskeletal: Normal range of motion.  Skin:    General: Skin is warm and dry.  Neurological:     Mental Status: She is alert.  Psychiatric:        Mood and Affect: Mood normal.      UC Treatments / Results  Labs (all labs ordered are listed, but only abnormal results are displayed) Labs Reviewed - No data to display  EKG None  Radiology No results found.  Procedures Procedures (including critical care time)  Medications Ordered in UC Medications - No data to display  Initial Impression / Assessment and Plan / UC Course  I have reviewed the triage vital signs and the nursing notes.  Pertinent labs & imaging results that were available during my care of the patient were reviewed by me and considered in my medical decision making (see chart for details).     Symptoms consistent with a viral upper respiratory infection Mucinex for cough, congestion and a thin mucus Hycodan cough syrup for bedtime Naproxen twice daily with food for body aches Follow up as needed for continued or worsening symptoms  Final Clinical Impressions(s) / UC Diagnoses   Final diagnoses:  Viral URI with cough     Discharge Instructions     I believe he had a viral upper respiratory infection I am giving you a prescription for Hycodan cough syrup to help with the cough.  Be aware this will make you drowsy. Mucinex for cough, congestion and to thin mucus.  He can take this during the day. Naproxen twice a day for pain, body aches Follow up as needed for continued or worsening symptoms     ED Prescriptions    Medication Sig Dispense Auth.  Provider   guaiFENesin (MUCINEX) 600 MG 12 hr tablet Take 1 tablet (600 mg total) by mouth 2 (two) times daily. 14 tablet Talayla Doyel A, NP   HYDROcodone-homatropine (HYCODAN) 5-1.5 MG/5ML syrup Take 5 mLs by mouth every 6 (six) hours as needed for cough. 120 mL Keni Elison A, NP   naproxen (NAPROSYN) 500 MG tablet Take 1 tablet (500 mg total) by mouth 2 (two) times daily. 30 tablet Loura Halt A, NP     Controlled Substance Prescriptions [place] Controlled Substance Registry consulted? Not Applicable   Orvan July, NP 06/21/18 1015

## 2018-06-30 ENCOUNTER — Ambulatory Visit
Admission: RE | Admit: 2018-06-30 | Discharge: 2018-06-30 | Disposition: A | Discharge status: Home / Self Care | Payer: PRIVATE HEALTH INSURANCE | Source: Ambulatory Visit | Attending: Registered Nurse | Admitting: Registered Nurse

## 2018-06-30 ENCOUNTER — Other Ambulatory Visit: Payer: Self-pay | Admitting: Registered Nurse

## 2018-06-30 DIAGNOSIS — S0990XA Unspecified injury of head, initial encounter: Secondary | ICD-10-CM

## 2018-06-30 DIAGNOSIS — R55 Syncope and collapse: Secondary | ICD-10-CM

## 2018-12-25 ENCOUNTER — Emergency Department (HOSPITAL_COMMUNITY)
Admission: EM | Admit: 2018-12-25 | Discharge: 2018-12-25 | Disposition: A | Discharge status: Home / Self Care | Payer: PRIVATE HEALTH INSURANCE | Attending: Emergency Medicine | Admitting: Emergency Medicine

## 2018-12-25 ENCOUNTER — Encounter (HOSPITAL_COMMUNITY): Payer: Self-pay | Admitting: Emergency Medicine

## 2018-12-25 ENCOUNTER — Other Ambulatory Visit: Payer: Self-pay

## 2018-12-25 DIAGNOSIS — R319 Hematuria, unspecified: Secondary | ICD-10-CM | POA: Insufficient documentation

## 2018-12-25 DIAGNOSIS — R350 Frequency of micturition: Secondary | ICD-10-CM | POA: Insufficient documentation

## 2018-12-25 DIAGNOSIS — N3090 Cystitis, unspecified without hematuria: Secondary | ICD-10-CM

## 2018-12-25 DIAGNOSIS — R7303 Prediabetes: Secondary | ICD-10-CM | POA: Insufficient documentation

## 2018-12-25 DIAGNOSIS — R3915 Urgency of urination: Secondary | ICD-10-CM | POA: Diagnosis not present

## 2018-12-25 DIAGNOSIS — Z79899 Other long term (current) drug therapy: Secondary | ICD-10-CM | POA: Insufficient documentation

## 2018-12-25 DIAGNOSIS — I10 Essential (primary) hypertension: Secondary | ICD-10-CM | POA: Diagnosis not present

## 2018-12-25 DIAGNOSIS — Z96641 Presence of right artificial hip joint: Secondary | ICD-10-CM | POA: Diagnosis not present

## 2018-12-25 DIAGNOSIS — F17210 Nicotine dependence, cigarettes, uncomplicated: Secondary | ICD-10-CM | POA: Diagnosis not present

## 2018-12-25 DIAGNOSIS — R300 Dysuria: Secondary | ICD-10-CM | POA: Diagnosis present

## 2018-12-25 LAB — URINALYSIS, ROUTINE W REFLEX MICROSCOPIC
Bacteria, UA: NONE SEEN
Bilirubin Urine: NEGATIVE
Glucose, UA: NEGATIVE mg/dL
Ketones, ur: NEGATIVE mg/dL
Nitrite: NEGATIVE
Protein, ur: 100 mg/dL — AB
RBC / HPF: >50 RBC/hpf — ABNORMAL HIGH (ref 0–5)
Specific Gravity, Urine: 1.02 (ref 1.005–1.030)
pH: 6 (ref 5.0–8.0)

## 2018-12-25 MED ORDER — PHENAZOPYRIDINE HCL 200 MG PO TABS: 200.0000 mg | ORAL_TABLET | Freq: Three times a day (TID) | ORAL | 0 refills | Status: DC

## 2018-12-25 MED ORDER — CEPHALEXIN 500 MG PO CAPS: 500.0000 mg | ORAL_CAPSULE | Freq: Four times a day (QID) | ORAL | 0 refills | Status: DC

## 2018-12-25 NOTE — ED Provider Notes (Signed)
[facility] DEPT Provider Note   CSN: [id number] Arrival date & time: 12/25/18  1930    History   Chief Complaint Chief Complaint  Patient presents with  . Hematuria    HPI [name] is a 58 y.o. female presents to the ER for evaluation of dysuria.  Onset today.  Associated with hematuria, urinary urgency, urinary frequency, bladder "pressure".  States her symptoms are similar to previous UTIs.  States for the last 2 weeks she has had very mild, intermittent pressure in her bladder when he urinates but all the other symptoms started this morning.  She has been taken Tylenol as needed without significant pain.  Reports history of kidney stones in the past but states her symptoms are not as severe or typical of her kidney stones.  She denies any associated fever, nausea, vomiting, flank pain, diarrhea, constipation.     HPI  Past Medical History:  Diagnosis Date  . Anxiety   . Depression   . Fatty liver   . History of kidney stones   . Hyperlipidemia   . Hyperparathyroidism ([place])   . Hypertension   . Nephrolithiasis   . Obesity   . OSA (obstructive sleep apnea) 03/09/2013   denies; does not use CPAP   . Pre-diabetes    last A1C in 2015 5.9% see 01-13-14 lab epic   . Sleep apnea   . Vitamin D deficiency   . Wound, breast    superficial appearing , reddened area to left upper breast; per patient, has been in sun recently , denies pain or drainage , "just itchy"     Patient Active Problem List   Diagnosis Date Noted  . Primary osteoarthritis of right hip 02/27/2017  . Morbid obesity ([place]) 02/20/2015  . Chest pain, unspecified 01/13/2014  . Chest pain 01/13/2014  . Abnormal EKG- inferior TWI 01/13/2014  . OSA (obstructive sleep apnea) 03/09/2013    Past Surgical History:  Procedure Laterality Date  . BACK SURGERY    . BREAST LUMPECTOMY WITH RADIOACTIVE SEED LOCALIZATION Right 02/05/2018   Procedure: BREAST LUMPECTOMY WITH RADIOACTIVE  SEED LOCALIZATION;  Surgeon: Alphonsa Overall, MD;  Location: [facility];  Service: General;  Laterality: Right;  . BREATH TEK H PYLORI N/A 08/31/2014   Procedure: BREATH TEK H PYLORI;  Surgeon: Alphonsa Overall, MD;  Location: [facility];  Service: General;  Laterality: N/A;  . LAPAROSCOPIC GASTRIC SLEEVE RESECTION N/A 02/20/2015   Procedure: LAPAROSCOPIC GASTRIC SLEEVE RESECTION;  Surgeon: Alphonsa Overall, MD;  Location: [facility];  Service: General;  Laterality: N/A;  . PARTIAL HYSTERECTOMY    . sleep study  04/08/13  . THYROID SURGERY    . TOTAL HIP ARTHROPLASTY Right 02/27/2017   Procedure: RIGHT TOTAL HIP ARTHROPLASTY ANTERIOR APPROACH;  Surgeon: Dorna Leitz, MD;  Location: [facility];  Service: Orthopedics;  Laterality: Right;  Panel Lenght: 120 mins     OB History   No obstetric history on file.      Home Medications    Prior to Admission medications   Medication Sig Start Date End Date Taking? Authorizing Provider  acetaminophen (TYLENOL) 500 MG tablet Take 1,000 mg by mouth every 6 (six) hours as needed for moderate pain.   Yes [provider]  amLODipine (NORVASC) 5 MG tablet Take 5 mg by mouth daily.   Yes [provider]  Cholecalciferol (VITAMIN D-3) 5000 UNITS TABS Take 5,000 Units by mouth every morning.    Yes [provider]  pantoprazole (PROTONIX)  40 MG tablet Take 40 mg by mouth 2 (two) times daily.   Yes [provider]  venlafaxine XR (EFFEXOR-XR) 75 MG 24 hr capsule Take 75 mg by mouth daily. 12/11/16  Yes [provider]  aspirin EC 81 MG tablet Take 1 tablet (81 mg total) by mouth daily. Patient not taking: Reported on 02/09/2015 01/13/14   Kelvin Cellar, MD  cephALEXin (KEFLEX) 500 MG capsule Take 1 capsule (500 mg total) by mouth 4 (four) times daily. 12/25/18   Kinnie Feil, PA-C  guaiFENesin (MUCINEX) 600 MG 12 hr tablet Take 1 tablet (600 mg total) by mouth 2 (two) times daily. Patient not taking: Reported on 12/25/2018 06/21/18    Loura Halt A, NP  HYDROcodone-homatropine (HYCODAN) 5-1.5 MG/5ML syrup Take 5 mLs by mouth every 6 (six) hours as needed for cough. Patient not taking: Reported on 12/25/2018 06/21/18   Loura Halt A, NP  naproxen (NAPROSYN) 500 MG tablet Take 1 tablet (500 mg total) by mouth 2 (two) times daily. Patient not taking: Reported on 12/25/2018 06/21/18   Loura Halt A, NP  phenazopyridine (PYRIDIUM) 200 MG tablet Take 1 tablet (200 mg total) by mouth 3 (three) times daily. 12/25/18   Kinnie Feil, PA-C  traMADol (ULTRAM) 50 MG tablet Take 1 tablet (50 mg total) by mouth every 6 (six) hours as needed for moderate pain. Patient not taking: Reported on 12/25/2018 02/05/18 02/05/19  Alphonsa Overall, MD    Family History Family History  Problem Relation Age of Onset  . COPD Mother   . Hypertension Mother   . Heart disease Mother   . Heart attack Mother   . Heart attack Father        died age 75 with the heart attack  . Heart disease Father   . Hypertension Sister   . Hypertension Brother   . Hypertension Sister   . Other Son        died age 9, autopsy pending possible aneurysm  . Colon cancer Neg Hx   . Breast cancer Neg Hx     Social History Social History   Tobacco Use  . Smoking status: Current Every Day Smoker    Packs/day: 0.25    Years: 20.00    Pack years: 5.00    Types: Cigarettes  . Smokeless tobacco: Never Used  Substance Use Topics  . Alcohol use: No    Alcohol/week: 0.0 standard drinks  . Drug use: No     Allergies   Cortisone, Prednisone, and Hydrocodone   Review of Systems Review of Systems  Genitourinary: Positive for difficulty urinating, dysuria, frequency and hematuria.  All other systems reviewed and are negative.    Physical Exam Updated Vital Signs BP 140/78   Pulse (!) 51   Temp 98.4 F (36.9 C) (Oral)   Resp 18   Ht 5\' 2"  (1.575 m)   Wt 89.8 kg   SpO2 97%   BMI 36.21 kg/m   Physical Exam Vitals signs and nursing note reviewed.   Constitutional:      Appearance: She is well-developed.     Comments: Non toxic in NAD  HENT:     Head: Normocephalic and atraumatic.     Nose: Nose normal.  Eyes:     Conjunctiva/sclera: Conjunctivae normal.  Neck:     Musculoskeletal: Normal range of motion.  Cardiovascular:     Rate and Rhythm: Normal rate and regular rhythm.  Pulmonary:     Effort: Pulmonary effort is normal.  Breath sounds: Normal breath sounds.  Abdominal:     General: Bowel sounds are normal.     Palpations: Abdomen is soft.     Tenderness: There is abdominal tenderness.     Comments: Focal suprapubic tenderness. No G/R/R. No CVA tenderness. Negative Murphy's and McBurney's. Active BS to lower quadrants.   Musculoskeletal: Normal range of motion.  Skin:    General: Skin is warm and dry.     Capillary Refill: Capillary refill takes less than 2 seconds.  Neurological:     Mental Status: She is alert.  Psychiatric:        Behavior: Behavior normal.      ED Treatments / Results  Labs (all labs ordered are listed, but only abnormal results are displayed) Labs Reviewed  URINALYSIS, ROUTINE W REFLEX MICROSCOPIC - Abnormal; Notable for the following components:      Result Value   APPearance CLOUDY (*)    Hgb urine dipstick LARGE (*)    Protein, ur 100 (*)    Leukocytes,Ua MODERATE (*)    RBC / HPF >50 (*)    All other components within normal limits  URINE CULTURE    EKG None  Radiology No results found.  Procedures Procedures (including critical care time)  Medications Ordered in ED Medications - No data to display   Initial Impression / Assessment and Plan / ED Course  I have reviewed the triage vital signs and the nursing notes.  Pertinent labs & imaging results that were available during my care of the patient were reviewed by me and considered in my medical decision making (see chart for details).  Clinical Course as of Dec 25 2155  Sat Dec 25, 2018  2151 Hgb urine  dipstick(!): LARGE [CG]  2151 RBC / HPF(!): >50 [CG]  2153 Chalmers Guest): MODERATE [CG]  2153 RBC / HPF(!): >50 [CG]  2153 WBC, UA: 21-50 [CG]  2153 Ca Oxalate Crys, UA: PRESENT [CG]    Clinical Course User Index [CG] Kinnie Feil, PA-C   Patient presents with classic UTI symptoms.  She has had these symptoms in the past and feels similar to previous UTIs.  Afebrile.  Suprapubic tenderness but no CVA tenderness, fever, nausea, vomiting.  UA shows moderate leukocytes, microscopic hematuria, 21-50 WBC, mucus and calcium oxalate crystals.  Discussed UA is suspicious for infection with patient.  Discussed that given hematuria we always consider possibility of kidney stone trying to pass, infected kidney stone however she denies any systemic symptoms, flank pain.  States that this does not feel similar to previous kidney stones.  Given her benign history, exam, lack of fever, no CVA tenderness or flank pain I considered kidney stone, infected kidney stone, pyelonephritis unlikely.  I discussed this MDM with patient and she agrees and states that she does not think this is a kidney stone.  She is comfortable deferring lab work, CT.  We will discharge her with Keflex, Pyridium, oral hydration.  Lengthy discussion about signs and symptoms to monitor for that would warrant immediate return to the ER.  Patient felt comfortable with this and is in agreement.  Discussed with EDP.  Final Clinical Impressions(s) / ED Diagnoses   Final diagnoses:  Cystitis    ED Discharge Orders         Ordered    cephALEXin (KEFLEX) 500 MG capsule  4 times daily     12/25/18 2153    phenazopyridine (PYRIDIUM) 200 MG tablet  3 times daily  12/25/18 2153           Kinnie Feil, PA-C 12/25/18 2200    Quintella Reichert, MD 12/26/18 [phone]

## 2018-12-25 NOTE — Discharge Instructions (Addendum)
You were seen in the ER for pressure in your bladder, burning with urination, blood in your urine, urinary urgency.  Your urine looks infected.  I suspect symptoms are from a bladder infection.  We discussed that it is possible that this could be related to a kidney stone trying to pass or a kidney infection however you did not report any symptoms of these conditions such as fever, flank pain, severe kidney type pain, nausea, vomiting.  We opted to defer any imaging or lab today and we will treat you for a bladder infection.  Take antibiotic as prescribed.  Take Pyridium which will help with bladder pain.  Stay hydrated.Alternate ibuprofen and acetaminophen every 6-8 hours for pain  Return immediately to the ER if your symptoms worsen over the next 2 days or if you develop flank pain, inability to void urine, fever

## 2018-12-25 NOTE — ED Triage Notes (Signed)
Pt reports having pelvic pressure for the last 3 weeks and then today began having painful urination with hematuria.

## 2018-12-28 LAB — URINE CULTURE: Culture: 60000 — AB

## 2018-12-29 ENCOUNTER — Telehealth: Payer: Self-pay

## 2018-12-29 NOTE — Telephone Encounter (Signed)
Post ED Visit - Positive Culture Follow-up  Culture report reviewed by antimicrobial stewardship pharmacist: [facility] Team []  Elenor Quinones, Pharm.D. []  Heide Guile, Pharm.D., BCPS AQ-ID []  Parks Neptune, Pharm.D., BCPS []  Alycia Rossetti, Pharm.D., BCPS []  [place], Pharm.D., BCPS, AAHIVP []  Legrand Como, Pharm.D., BCPS, AAHIVP []  Salome Arnt, PharmD, BCPS []  Johnnette Gourd, PharmD, BCPS []  Hughes Better, PharmD, BCPS []  Leeroy Cha, PharmD []  Laqueta Linden, PharmD, BCPS []  Albertina Parr, PharmD  [facility] Team []  Leodis Sias, PharmD []  Lindell Spar, PharmD []  Royetta Asal, PharmD []  Graylin Shiver, Rph []  Rema Fendt) Glennon Mac, PharmD []  Arlyn Dunning, PharmD []  Netta Cedars, PharmD []  Dia Sitter, PharmD []  Leone Haven, PharmD []  Gretta Arab, PharmD []  Theodis Shove, PharmD []  Peggyann Juba, PharmD [x]  Reuel Boom, PharmD   Positive urine culture Treated with Cephalexin, organism sensitive to the same and no further patient follow-up is required at this time.  Genia Del 12/29/2018, 12:45 PM

## 2019-01-10 ENCOUNTER — Other Ambulatory Visit: Payer: Self-pay

## 2019-01-10 ENCOUNTER — Inpatient Hospital Stay (HOSPITAL_COMMUNITY)
Admission: EM | Admit: 2019-01-10 | Discharge: 2019-01-13 | DRG: 690 | Disposition: A | Discharge status: Home / Self Care | Payer: PRIVATE HEALTH INSURANCE | Attending: Family Medicine | Admitting: Family Medicine

## 2019-01-10 ENCOUNTER — Emergency Department (HOSPITAL_COMMUNITY): Payer: PRIVATE HEALTH INSURANCE

## 2019-01-10 DIAGNOSIS — E892 Postprocedural hypoparathyroidism: Secondary | ICD-10-CM | POA: Diagnosis present

## 2019-01-10 DIAGNOSIS — E785 Hyperlipidemia, unspecified: Secondary | ICD-10-CM | POA: Diagnosis present

## 2019-01-10 DIAGNOSIS — B9620 Unspecified Escherichia coli [E. coli] as the cause of diseases classified elsewhere: Secondary | ICD-10-CM | POA: Diagnosis present

## 2019-01-10 DIAGNOSIS — G4733 Obstructive sleep apnea (adult) (pediatric): Secondary | ICD-10-CM | POA: Diagnosis present

## 2019-01-10 DIAGNOSIS — N390 Urinary tract infection, site not specified: Secondary | ICD-10-CM | POA: Diagnosis not present

## 2019-01-10 DIAGNOSIS — N12 Tubulo-interstitial nephritis, not specified as acute or chronic: Principal | ICD-10-CM

## 2019-01-10 DIAGNOSIS — R109 Unspecified abdominal pain: Secondary | ICD-10-CM | POA: Diagnosis not present

## 2019-01-10 DIAGNOSIS — Z6838 Body mass index (BMI) 38.0-38.9, adult: Secondary | ICD-10-CM

## 2019-01-10 DIAGNOSIS — R319 Hematuria, unspecified: Secondary | ICD-10-CM | POA: Diagnosis not present

## 2019-01-10 DIAGNOSIS — K760 Fatty (change of) liver, not elsewhere classified: Secondary | ICD-10-CM | POA: Diagnosis present

## 2019-01-10 DIAGNOSIS — Z90711 Acquired absence of uterus with remaining cervical stump: Secondary | ICD-10-CM

## 2019-01-10 DIAGNOSIS — K219 Gastro-esophageal reflux disease without esophagitis: Secondary | ICD-10-CM | POA: Diagnosis present

## 2019-01-10 DIAGNOSIS — E559 Vitamin D deficiency, unspecified: Secondary | ICD-10-CM | POA: Diagnosis present

## 2019-01-10 DIAGNOSIS — N200 Calculus of kidney: Secondary | ICD-10-CM | POA: Diagnosis present

## 2019-01-10 DIAGNOSIS — F40240 Claustrophobia: Secondary | ICD-10-CM | POA: Diagnosis present

## 2019-01-10 DIAGNOSIS — Z720 Tobacco use: Secondary | ICD-10-CM

## 2019-01-10 DIAGNOSIS — Z20828 Contact with and (suspected) exposure to other viral communicable diseases: Secondary | ICD-10-CM | POA: Diagnosis present

## 2019-01-10 DIAGNOSIS — Z87442 Personal history of urinary calculi: Secondary | ICD-10-CM

## 2019-01-10 DIAGNOSIS — R7303 Prediabetes: Secondary | ICD-10-CM | POA: Diagnosis present

## 2019-01-10 DIAGNOSIS — F329 Major depressive disorder, single episode, unspecified: Secondary | ICD-10-CM | POA: Diagnosis present

## 2019-01-10 DIAGNOSIS — Z885 Allergy status to narcotic agent status: Secondary | ICD-10-CM

## 2019-01-10 DIAGNOSIS — Z888 Allergy status to other drugs, medicaments and biological substances status: Secondary | ICD-10-CM

## 2019-01-10 DIAGNOSIS — D241 Benign neoplasm of right breast: Secondary | ICD-10-CM | POA: Diagnosis present

## 2019-01-10 DIAGNOSIS — Z79899 Other long term (current) drug therapy: Secondary | ICD-10-CM

## 2019-01-10 DIAGNOSIS — Z8249 Family history of ischemic heart disease and other diseases of the circulatory system: Secondary | ICD-10-CM

## 2019-01-10 DIAGNOSIS — F419 Anxiety disorder, unspecified: Secondary | ICD-10-CM

## 2019-01-10 DIAGNOSIS — E669 Obesity, unspecified: Secondary | ICD-10-CM | POA: Diagnosis present

## 2019-01-10 DIAGNOSIS — I10 Essential (primary) hypertension: Secondary | ICD-10-CM | POA: Diagnosis present

## 2019-01-10 LAB — URINALYSIS, ROUTINE W REFLEX MICROSCOPIC
Bilirubin Urine: NEGATIVE
Glucose, UA: NEGATIVE mg/dL
Ketones, ur: 5 mg/dL — AB
Nitrite: NEGATIVE
Protein, ur: 30 mg/dL — AB
Specific Gravity, Urine: 1.018 (ref 1.005–1.030)
WBC, UA: >50 WBC/hpf — ABNORMAL HIGH (ref 0–5)
pH: 5 (ref 5.0–8.0)

## 2019-01-10 LAB — COMPREHENSIVE METABOLIC PANEL
ALT: 18 U/L (ref 0–44)
AST: 22 U/L (ref 15–41)
Albumin: 4 g/dL (ref 3.5–5.0)
Alkaline Phosphatase: 101 U/L (ref 38–126)
Anion gap: 9 (ref 5–15)
BUN: 23 mg/dL — ABNORMAL HIGH (ref 6–20)
CO2: 23 mmol/L (ref 22–32)
Calcium: 8.8 mg/dL — ABNORMAL LOW (ref 8.9–10.3)
Chloride: 108 mmol/L (ref 98–111)
Creatinine, Ser: 0.77 mg/dL (ref 0.44–1.00)
GFR calc Af Amer: >60 mL/min (ref >60)
GFR calc non Af Amer: >60 mL/min (ref >60)
Glucose, Bld: 99 mg/dL (ref 70–99)
Potassium: 4.1 mmol/L (ref 3.5–5.1)
Sodium: 140 mmol/L (ref 135–145)
Total Bilirubin: 1.6 mg/dL — ABNORMAL HIGH (ref 0.3–1.2)
Total Protein: 7 g/dL (ref 6.5–8.1)

## 2019-01-10 LAB — I-STAT BETA HCG BLOOD, ED (MC, WL, AP ONLY): I-stat hCG, quantitative: <5 m[IU]/mL (ref <5)

## 2019-01-10 LAB — SARS CORONAVIRUS 2 BY RT PCR (HOSPITAL ORDER, PERFORMED IN ~~LOC~~ HOSPITAL LAB): SARS Coronavirus 2: NEGATIVE

## 2019-01-10 LAB — CBC
HCT: 43.8 % (ref 36.0–46.0)
Hemoglobin: 13.9 g/dL (ref 12.0–15.0)
MCH: 29 pg (ref 26.0–34.0)
MCHC: 31.7 g/dL (ref 30.0–36.0)
MCV: 91.3 fL (ref 80.0–100.0)
Platelets: 150 10*3/uL (ref 150–400)
RBC: 4.8 MIL/uL (ref 3.87–5.11)
RDW: 13.3 % (ref 11.5–15.5)
WBC: 6.9 10*3/uL (ref 4.0–10.5)
nRBC: 0 % (ref 0.0–0.2)

## 2019-01-10 LAB — LIPASE, BLOOD: Lipase: 45 U/L (ref 11–51)

## 2019-01-10 MED ORDER — NICOTINE 7 MG/24HR TD PT24
7.0000 mg | MEDICATED_PATCH | Freq: Every day | TRANSDERMAL | Status: DC
  Filled 2019-01-10 (×2): qty 1

## 2019-01-10 MED ORDER — ACETAMINOPHEN 650 MG RE SUPP: 650.0000 mg | Freq: Four times a day (QID) | RECTAL | Status: DC | PRN

## 2019-01-10 MED ORDER — FENTANYL CITRATE (PF) 100 MCG/2ML IJ SOLN
25.0000 ug | Freq: Once | INTRAMUSCULAR | Status: AC
  Administered 2019-01-10: 25 ug via INTRAVENOUS
  Filled 2019-01-10: qty 2

## 2019-01-10 MED ORDER — VENLAFAXINE HCL ER 75 MG PO CP24
75.0000 mg | ORAL_CAPSULE | Freq: Every day | ORAL | Status: DC
  Administered 2019-01-11 – 2019-01-13 (×3): 75 mg via ORAL
  Filled 2019-01-10 (×3): qty 1

## 2019-01-10 MED ORDER — ACETAMINOPHEN 325 MG PO TABS: 650.0000 mg | ORAL_TABLET | Freq: Four times a day (QID) | ORAL | Status: DC | PRN

## 2019-01-10 MED ORDER — HEPARIN SODIUM (PORCINE) 5000 UNIT/ML IJ SOLN
5000.0000 [IU] | Freq: Three times a day (TID) | INTRAMUSCULAR | Status: DC
  Administered 2019-01-11: 5000 [IU] via SUBCUTANEOUS
  Filled 2019-01-10 (×5): qty 1

## 2019-01-10 MED ORDER — SODIUM CHLORIDE 0.9 % IV SOLN
INTRAVENOUS | Status: DC
  Administered 2019-01-11: 01:00:00 via INTRAVENOUS

## 2019-01-10 MED ORDER — SODIUM CHLORIDE 0.9 % IV SOLN
1.0000 g | Freq: Every day | INTRAVENOUS | Status: DC
  Administered 2019-01-11 – 2019-01-12 (×2): 1 g via INTRAVENOUS
  Filled 2019-01-10 (×2): qty 1

## 2019-01-10 MED ORDER — ONDANSETRON HCL 4 MG/2ML IJ SOLN
4.0000 mg | Freq: Four times a day (QID) | INTRAMUSCULAR | Status: DC | PRN
  Administered 2019-01-11: 4 mg via INTRAVENOUS
  Filled 2019-01-10: qty 2

## 2019-01-10 MED ORDER — KETOROLAC TROMETHAMINE 15 MG/ML IJ SOLN
15.0000 mg | Freq: Four times a day (QID) | INTRAMUSCULAR | Status: DC | PRN
  Administered 2019-01-10 – 2019-01-11 (×2): 15 mg via INTRAVENOUS
  Filled 2019-01-10 (×2): qty 1

## 2019-01-10 MED ORDER — SODIUM CHLORIDE 0.9 % IV SOLN
1.0000 g | Freq: Once | INTRAVENOUS | Status: AC
  Administered 2019-01-10: 1 g via INTRAVENOUS
  Filled 2019-01-10: qty 10

## 2019-01-10 MED ORDER — KETOROLAC TROMETHAMINE 15 MG/ML IJ SOLN: 15.0000 mg | Freq: Once | INTRAMUSCULAR | Status: DC

## 2019-01-10 MED ORDER — PANTOPRAZOLE SODIUM 40 MG PO TBEC
40.0000 mg | DELAYED_RELEASE_TABLET | Freq: Two times a day (BID) | ORAL | Status: DC
  Administered 2019-01-11 – 2019-01-13 (×5): 40 mg via ORAL
  Filled 2019-01-10 (×6): qty 1

## 2019-01-10 MED ORDER — AMLODIPINE BESYLATE 5 MG PO TABS
5.0000 mg | ORAL_TABLET | Freq: Every day | ORAL | Status: DC
  Administered 2019-01-11 – 2019-01-13 (×3): 5 mg via ORAL
  Filled 2019-01-10 (×3): qty 1

## 2019-01-10 MED ORDER — ONDANSETRON HCL 4 MG/2ML IJ SOLN
4.0000 mg | Freq: Once | INTRAMUSCULAR | Status: AC
  Administered 2019-01-10: 4 mg via INTRAVENOUS
  Filled 2019-01-10: qty 2

## 2019-01-10 NOTE — ED Provider Notes (Signed)
[facility] DEPT Provider Note   CSN: [id number] Arrival date & time: 01/10/19  1510     History   Chief Complaint Chief Complaint  Patient presents with  . Abdominal Pain    HPI [name] is a 58 y.o. female.     HPI Patient presents with left-sided abdominal pain. Patient knowledges multiple medical issues, but states that she was generally well until about 1 month ago.  Since that time she has had episodic left-sided abdominal pain, dysuria.  On she is completed 2 courses of antibiotics, Keflex and and another unknown, each with transient improvement in her symptoms, but with recurrence soon thereafter. She notes that in spite of taking OTC medication substantially, she has worsening abdominal pain, left-sided, sore, and dysuria, with pressure-like sensation. No objective fever, there is mild nausea, mild anorexia.  Past Medical History:  Diagnosis Date  . Anxiety   . Depression   . Fatty liver   . History of kidney stones   . Hyperlipidemia   . Hyperparathyroidism ([place])   . Hypertension   . Nephrolithiasis   . Obesity   . OSA (obstructive sleep apnea) 03/09/2013   denies; does not use CPAP   . Pre-diabetes    last A1C in 2015 5.9% see 01-13-14 lab epic   . Sleep apnea   . Vitamin D deficiency   . Wound, breast    superficial appearing , reddened area to left upper breast; per patient, has been in sun recently , denies pain or drainage , "just itchy"     Patient Active Problem List   Diagnosis Date Noted  . Primary osteoarthritis of right hip 02/27/2017  . Morbid obesity ([place]) 02/20/2015  . Chest pain, unspecified 01/13/2014  . Chest pain 01/13/2014  . Abnormal EKG- inferior TWI 01/13/2014  . OSA (obstructive sleep apnea) 03/09/2013    Past Surgical History:  Procedure Laterality Date  . BACK SURGERY    . BREAST LUMPECTOMY WITH RADIOACTIVE SEED LOCALIZATION Right 02/05/2018   Procedure: BREAST LUMPECTOMY WITH RADIOACTIVE  SEED LOCALIZATION;  Surgeon: Alphonsa Overall, MD;  Location: [facility];  Service: General;  Laterality: Right;  . BREATH TEK H PYLORI N/A 08/31/2014   Procedure: BREATH TEK H PYLORI;  Surgeon: Alphonsa Overall, MD;  Location: [facility];  Service: General;  Laterality: N/A;  . LAPAROSCOPIC GASTRIC SLEEVE RESECTION N/A 02/20/2015   Procedure: LAPAROSCOPIC GASTRIC SLEEVE RESECTION;  Surgeon: Alphonsa Overall, MD;  Location: [facility];  Service: General;  Laterality: N/A;  . PARTIAL HYSTERECTOMY    . sleep study  04/08/13  . THYROID SURGERY    . TOTAL HIP ARTHROPLASTY Right 02/27/2017   Procedure: RIGHT TOTAL HIP ARTHROPLASTY ANTERIOR APPROACH;  Surgeon: Dorna Leitz, MD;  Location: [facility];  Service: Orthopedics;  Laterality: Right;  Panel Lenght: 120 mins     OB History   No obstetric history on file.      Home Medications    Prior to Admission medications   Medication Sig Start Date End Date Taking? Authorizing Provider  acetaminophen (TYLENOL) 500 MG tablet Take 1,000 mg by mouth every 6 (six) hours as needed for moderate pain.   Yes [provider]  amLODipine (NORVASC) 5 MG tablet Take 5 mg by mouth daily.   Yes [provider]  Cholecalciferol (VITAMIN D-3) 5000 UNITS TABS Take 5,000 Units by mouth every morning.    Yes [provider]  esomeprazole (NEXIUM) 20 MG capsule Take 20 mg by mouth at bedtime.  Yes [provider]  pantoprazole (PROTONIX) 40 MG tablet Take 40 mg by mouth 2 (two) times daily.   Yes [provider]  venlafaxine XR (EFFEXOR-XR) 75 MG 24 hr capsule Take 75 mg by mouth daily. 12/11/16  Yes [provider]  aspirin EC 81 MG tablet Take 1 tablet (81 mg total) by mouth daily. Patient not taking: Reported on 02/09/2015 01/13/14   Kelvin Cellar, MD  cephALEXin (KEFLEX) 500 MG capsule Take 1 capsule (500 mg total) by mouth 4 (four) times daily. Patient not taking: Reported on 01/10/2019 12/25/18   Kinnie Feil, PA-C   guaiFENesin (MUCINEX) 600 MG 12 hr tablet Take 1 tablet (600 mg total) by mouth 2 (two) times daily. Patient not taking: Reported on 12/25/2018 06/21/18   Loura Halt A, NP  HYDROcodone-homatropine (HYCODAN) 5-1.5 MG/5ML syrup Take 5 mLs by mouth every 6 (six) hours as needed for cough. Patient not taking: Reported on 12/25/2018 06/21/18   Loura Halt A, NP  naproxen (NAPROSYN) 500 MG tablet Take 1 tablet (500 mg total) by mouth 2 (two) times daily. Patient not taking: Reported on 12/25/2018 06/21/18   Loura Halt A, NP  phenazopyridine (PYRIDIUM) 200 MG tablet Take 1 tablet (200 mg total) by mouth 3 (three) times daily. Patient not taking: Reported on 01/10/2019 12/25/18   Kinnie Feil, PA-C  traMADol (ULTRAM) 50 MG tablet Take 1 tablet (50 mg total) by mouth every 6 (six) hours as needed for moderate pain. Patient not taking: Reported on 12/25/2018 02/05/18 02/05/19  Alphonsa Overall, MD    Family History Family History  Problem Relation Age of Onset  . COPD Mother   . Hypertension Mother   . Heart disease Mother   . Heart attack Mother   . Heart attack Father        died age 46 with the heart attack  . Heart disease Father   . Hypertension Sister   . Hypertension Brother   . Hypertension Sister   . Other Son        died age 59, autopsy pending possible aneurysm  . Colon cancer Neg Hx   . Breast cancer Neg Hx     Social History Social History   Tobacco Use  . Smoking status: Current Every Day Smoker    Packs/day: 0.25    Years: 20.00    Pack years: 5.00    Types: Cigarettes  . Smokeless tobacco: Never Used  Substance Use Topics  . Alcohol use: No    Alcohol/week: 0.0 standard drinks  . Drug use: No     Allergies   Cortisone, Prednisone, and Hydrocodone   Review of Systems Review of Systems  Constitutional:       Per HPI, otherwise negative  HENT:       Per HPI, otherwise negative  Respiratory:       Per HPI, otherwise negative  Cardiovascular:       Per HPI, otherwise  negative  Gastrointestinal: Positive for abdominal pain, nausea and vomiting.  Endocrine:       Negative aside from HPI  Genitourinary:       Neg aside from HPI   Musculoskeletal:       Per HPI, otherwise negative  Skin: Negative.   Neurological: Negative for syncope.     Physical Exam Updated Vital Signs BP 128/77   Pulse (!) 54   Temp 98 F (36.7 C) (Oral)   Resp 16   SpO2 96%   Physical Exam  Vitals signs and nursing note reviewed.  Constitutional:      Appearance: She is well-developed.     Comments: Uncomfortable appearing adult female awake and alert  HENT:     Head: Normocephalic and atraumatic.  Eyes:     Conjunctiva/sclera: Conjunctivae normal.  Cardiovascular:     Rate and Rhythm: Normal rate and regular rhythm.  Pulmonary:     Effort: Pulmonary effort is normal. No respiratory distress.     Breath sounds: Normal breath sounds. No stridor.  Abdominal:     General: There is no distension.     Tenderness: There is abdominal tenderness in the left upper quadrant and left lower quadrant. There is guarding.  Skin:    General: Skin is warm and dry.  Neurological:     Mental Status: She is alert and oriented to person, place, and time.     Cranial Nerves: No cranial nerve deficit.  Psychiatric:        Mood and Affect: Mood is anxious.      ED Treatments / Results  Labs (all labs ordered are listed, but only abnormal results are displayed) Labs Reviewed  COMPREHENSIVE METABOLIC PANEL - Abnormal; Notable for the following components:      Result Value   BUN 23 (*)    Calcium 8.8 (*)    Total Bilirubin 1.6 (*)    All other components within normal limits  URINALYSIS, ROUTINE W REFLEX MICROSCOPIC - Abnormal; Notable for the following components:   APPearance HAZY (*)    Hgb urine dipstick SMALL (*)    Ketones, ur 5 (*)    Protein, ur 30 (*)    Leukocytes,Ua LARGE (*)    WBC, UA >50 (*)    Bacteria, UA FEW (*)    All other components within normal  limits  URINE CULTURE  SARS CORONAVIRUS 2 (TAT 6-12 HRS)  LIPASE, BLOOD  CBC  I-STAT BETA HCG BLOOD, ED ([facility], [facility], [facility] ONLY)    Radiology Ct Renal Stone Study  Result Date: 01/10/2019 CLINICAL DATA:  Left flank pain.  History of kidney stones.  UTI. EXAM: CT ABDOMEN AND PELVIS WITHOUT CONTRAST TECHNIQUE: Multidetector CT imaging of the abdomen and pelvis was performed following the standard protocol without IV contrast. COMPARISON:  CT abdomen pelvis 04/08/2018 FINDINGS: Lower chest: Mild scarring in the left lung base. No infiltrate or effusion in the lung bases. Hepatobiliary: Normal liver.  Gallbladder and bile ducts normal. Pancreas: Negative for mass or edema or calcification of pancreas Spleen: Negative Adrenals/Urinary Tract: 3 mm, and 5 mm left midpole calculi. No hydronephrosis or ureteral calculus. No renal calculi on the right. No renal obstruction on the right. No mass lesion. Stomach/Bowel: Prior gastric surgery. Small hiatal hernia. Negative for bowel obstruction. Negative for bowel mass or edema. Mild sigmoid diverticulosis without diverticulitis. Appendix nonvisualized. Vascular/Lymphatic: Mild atherosclerotic aorta without aneurysm. Negative for lymphadenopathy. Reproductive: Hysterectomy.  No pelvic mass Other: Negative for hernia Musculoskeletal: Right hip replacement. Lumbar fusion L4-5. Lumbar disc degeneration. No acute skeletal abnormality. IMPRESSION: 1. Two small left renal calculi in the midpole. No renal obstruction or ureteral calculus. Negative right kidney 2. Mild sigmoid diverticulosis. No diverticulitis and no acute abnormality. Electronically Signed   By: Franchot Gallo M.D.   On: 01/10/2019 20:34    Procedures Procedures (including critical care time)  Medications Ordered in ED Medications  cefTRIAXone (ROCEPHIN) 1 g in sodium chloride 0.9 % 100 mL IVPB (has no administration in time range)  ketorolac (TORADOL) 15  MG/ML injection 15 mg (has no administration in time  range)  fentaNYL (SUBLIMAZE) injection 25 mcg (25 mcg Intravenous Given 01/10/19 1940)  ondansetron (ZOFRAN) injection 4 mg (4 mg Intravenous Given 01/10/19 1940)     Initial Impression / Assessment and Plan / ED Course  I have reviewed the triage vital signs and the nursing notes.  Pertinent labs & imaging results that were available during my care of the patient were reviewed by me and considered in my medical decision making (see chart for details).   9:33 PM Patient continues to complain of pain.  I we discussed all findings including urinalysis concerning for ongoing infection. CT generally reassuring, though there is multiple kidney stones demonstrated on the left side, and with some suspicion for pyelonephritis, given the patient's abnormal urinalysis, pain, failure to improve in spite of 2 prior antibiotics will start IV ceftriaxone, urine culture will be sent and she will be admitted for further monitoring, management.  Final Clinical Impressions(s) / ED Diagnoses   Final diagnoses:  Pyelonephritis     Carmin Muskrat, MD 01/10/19 2135

## 2019-01-10 NOTE — ED Notes (Signed)
Gave report to [facility], Therapist, sports for room 1331.

## 2019-01-10 NOTE — H&P (Signed)
History and Physical    [name] [id number] DOB: 1960/12/08 DOA: 01/10/2019  PCP: Jani Gravel, MD Patient coming from: Home  Chief Complaint: Abdominal pain, dysuria  HPI: [name] is a 58 y.o. 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  Patient states she recently finished her course of Keflex and was initially feeling better but then symptoms recurred 3 days ago.  She is having left-sided flank pain, left-sided abdominal pain/discomfort, nausea, and dysuria.  She had a temperature of 102 F at home yesterday and was having chills.  No other complaints.  ED Course: No fever, tachycardia, or hypotension.  No leukocytosis.  Creatinine 0.7, baseline.  T bili mildly elevated at 1.6, was elevated 2 years ago as well.  Remainder of LFTs normal.  Lipase normal.  UA with large amount of leukocytes, 6-10 RBCs, greater than 50 WBCs, and few bacteria.  Urine culture pending.  COVID-19 test pending.  CT renal stone study showing 2 small left renal calculi in the midpole.  No renal obstruction or ureteral calculus.  Negative right kidney.  Patient received Toradol, Zofran, and ceftriaxone.  Review of Systems:  All systems reviewed and apart from history of presenting illness, are negative.  Past Medical History:  Diagnosis Date  . Anxiety   . Depression   . Fatty liver   . History of kidney stones   . Hyperlipidemia   . Hyperparathyroidism ([place])   . Hypertension   . Nephrolithiasis   . Obesity   . OSA (obstructive sleep apnea) 03/09/2013   denies; does not use CPAP   . Pre-diabetes    last A1C in 2015 5.9% see 01-13-14 lab epic   . Sleep apnea   . Vitamin D  deficiency   . Wound, breast    superficial appearing , reddened area to left upper breast; per patient, has been in sun recently , denies pain or drainage , "just itchy"     Past Surgical History:  Procedure Laterality Date  . BACK SURGERY    . BREAST LUMPECTOMY WITH RADIOACTIVE SEED LOCALIZATION Right 02/05/2018   Procedure: BREAST LUMPECTOMY WITH RADIOACTIVE SEED LOCALIZATION;  Surgeon: Alphonsa Overall, MD;  Location: [facility];  Service: General;  Laterality: Right;  . BREATH TEK H PYLORI N/A 08/31/2014   Procedure: BREATH TEK H PYLORI;  Surgeon: Alphonsa Overall, MD;  Location: [facility];  Service: General;  Laterality: N/A;  . LAPAROSCOPIC GASTRIC SLEEVE RESECTION N/A 02/20/2015   Procedure: LAPAROSCOPIC GASTRIC SLEEVE RESECTION;  Surgeon: Alphonsa Overall, MD;  Location: [facility];  Service: General;  Laterality: N/A;  . PARTIAL HYSTERECTOMY    . sleep study  04/08/13  . THYROID SURGERY    . TOTAL HIP ARTHROPLASTY Right 02/27/2017   Procedure: RIGHT TOTAL HIP ARTHROPLASTY ANTERIOR APPROACH;  Surgeon: Dorna Leitz, MD;  Location: [facility];  Service: Orthopedics;  Laterality: Right;  Panel Lenght: 120 mins     reports that she has been smoking cigarettes. She has a 5.00 pack-year smoking history. She has never used smokeless tobacco. She reports that she does not drink alcohol or use drugs.  Allergies  Allergen Reactions  . Cortisone Swelling     SWELLING REACTION UNSPECIFIED   . Prednisone Swelling  SWELLING REACTION UNSPECIFIED   . Hydrocodone Itching    Family History  Problem Relation Age of Onset  . COPD Mother   . Hypertension Mother   . Heart disease Mother   . Heart attack Mother   . Heart attack Father        died age 66 with the heart attack  . Heart disease Father   . Hypertension Sister   . Hypertension Brother   . Hypertension Sister   . Other Son        died age 71, autopsy pending possible aneurysm  . Colon cancer Neg Hx   . Breast cancer Neg Hx     Prior to  Admission medications   Medication Sig Start Date End Date Taking? Authorizing Provider  acetaminophen (TYLENOL) 500 MG tablet Take 1,000 mg by mouth every 6 (six) hours as needed for moderate pain.   Yes [provider]  amLODipine (NORVASC) 5 MG tablet Take 5 mg by mouth daily.   Yes [provider]  Cholecalciferol (VITAMIN D-3) 5000 UNITS TABS Take 5,000 Units by mouth every morning.    Yes [provider]  esomeprazole (NEXIUM) 20 MG capsule Take 20 mg by mouth at bedtime.   Yes [provider]  pantoprazole (PROTONIX) 40 MG tablet Take 40 mg by mouth 2 (two) times daily.   Yes [provider]  venlafaxine XR (EFFEXOR-XR) 75 MG 24 hr capsule Take 75 mg by mouth daily. 12/11/16  Yes [provider]  aspirin EC 81 MG tablet Take 1 tablet (81 mg total) by mouth daily. Patient not taking: Reported on 02/09/2015 01/13/14   Kelvin Cellar, MD  cephALEXin (KEFLEX) 500 MG capsule Take 1 capsule (500 mg total) by mouth 4 (four) times daily. Patient not taking: Reported on 01/10/2019 12/25/18   Kinnie Feil, PA-C  guaiFENesin (MUCINEX) 600 MG 12 hr tablet Take 1 tablet (600 mg total) by mouth 2 (two) times daily. Patient not taking: Reported on 12/25/2018 06/21/18   Loura Halt A, NP  HYDROcodone-homatropine (HYCODAN) 5-1.5 MG/5ML syrup Take 5 mLs by mouth every 6 (six) hours as needed for cough. Patient not taking: Reported on 12/25/2018 06/21/18   Loura Halt A, NP  naproxen (NAPROSYN) 500 MG tablet Take 1 tablet (500 mg total) by mouth 2 (two) times daily. Patient not taking: Reported on 12/25/2018 06/21/18   Loura Halt A, NP  phenazopyridine (PYRIDIUM) 200 MG tablet Take 1 tablet (200 mg total) by mouth 3 (three) times daily. Patient not taking: Reported on 01/10/2019 12/25/18   Kinnie Feil, PA-C  traMADol (ULTRAM) 50 MG tablet Take 1 tablet (50 mg total) by mouth every 6 (six) hours as needed for moderate pain. Patient not taking: Reported on  12/25/2018 02/05/18 02/05/19  Alphonsa Overall, MD    Physical Exam: Vitals:   01/10/19 2030 01/10/19 2100 01/10/19 2130 01/10/19 2200  BP: 123/75 128/77 (!) 136/102 133/76  Pulse: (!) 54 (!) 54 (!) 55 (!) 47  Resp:      Temp:      TempSrc:      SpO2: 97% 96% 97% 96%    Physical Exam  Constitutional: She is oriented to person, place, and time. She appears well-developed and well-nourished.  HENT:  Head: Normocephalic.  Mouth/Throat: Oropharynx is clear and moist.  Eyes: Right eye exhibits no discharge. Left eye exhibits no discharge.  Neck: Neck supple.  Cardiovascular: Normal rate, regular rhythm and intact distal pulses.  Pulmonary/Chest: Effort normal and breath  sounds normal. No respiratory distress. She has no wheezes. She has no rales.  Abdominal: Soft. Bowel sounds are normal. She exhibits no distension. There is abdominal tenderness. There is no rebound and no guarding.  Mild left upper and lower quadrant abdominal tenderness  Musculoskeletal:        General: No edema.     Comments: Left-sided CVA tenderness  Neurological: She is alert and oriented to person, place, and time.  Skin: Skin is warm and dry. She is not diaphoretic.     Labs on Admission: I have personally reviewed following labs and imaging studies  CBC: Recent Labs  Lab 01/10/19 1556  WBC 6.9  HGB 13.9  HCT 43.8  MCV 91.3  PLT [id number]   Basic Metabolic Panel: Recent Labs  Lab 01/10/19 1556  NA 140  K 4.1  CL 108  CO2 23  GLUCOSE 99  BUN 23*  CREATININE 0.77  CALCIUM 8.8*   GFR: CrCl cannot be calculated (Unknown ideal weight.). Liver Function Tests: Recent Labs  Lab 01/10/19 1556  AST 22  ALT 18  ALKPHOS 101  BILITOT 1.6*  PROT 7.0  ALBUMIN 4.0   Recent Labs  Lab 01/10/19 1556  LIPASE 45   No results for input(s): AMMONIA in the last 168 hours. Coagulation Profile: No results for input(s): INR, PROTIME in the last 168 hours. Cardiac Enzymes: No results for input(s): CKTOTAL,  CKMB, CKMBINDEX, TROPONINI in the last 168 hours. BNP (last 3 results) No results for input(s): PROBNP in the last 8760 hours. HbA1C: No results for input(s): HGBA1C in the last 72 hours. CBG: No results for input(s): GLUCAP in the last 168 hours. Lipid Profile: No results for input(s): CHOL, HDL, LDLCALC, TRIG, CHOLHDL, LDLDIRECT in the last 72 hours. Thyroid Function Tests: No results for input(s): TSH, T4TOTAL, FREET4, T3FREE, THYROIDAB in the last 72 hours. Anemia Panel: No results for input(s): VITAMINB12, FOLATE, FERRITIN, TIBC, IRON, RETICCTPCT in the last 72 hours. Urine analysis:    Component Value Date/Time   COLORURINE YELLOW 01/10/2019 1527   APPEARANCEUR HAZY (A) 01/10/2019 1527   LABSPEC 1.018 01/10/2019 1527   PHURINE 5.0 01/10/2019 1527   GLUCOSEU NEGATIVE 01/10/2019 1527   HGBUR SMALL (A) 01/10/2019 1527   BILIRUBINUR NEGATIVE 01/10/2019 1527   KETONESUR 5 (A) 01/10/2019 1527   PROTEINUR 30 (A) 01/10/2019 1527   UROBILINOGEN 0.2 03/21/2014 2327   NITRITE NEGATIVE 01/10/2019 1527   LEUKOCYTESUR LARGE (A) 01/10/2019 1527    Radiological Exams on Admission: Ct Renal Stone Study  Result Date: 01/10/2019 CLINICAL DATA:  Left flank pain.  History of kidney stones.  UTI. EXAM: CT ABDOMEN AND PELVIS WITHOUT CONTRAST TECHNIQUE: Multidetector CT imaging of the abdomen and pelvis was performed following the standard protocol without IV contrast. COMPARISON:  CT abdomen pelvis 04/08/2018 FINDINGS: Lower chest: Mild scarring in the left lung base. No infiltrate or effusion in the lung bases. Hepatobiliary: Normal liver.  Gallbladder and bile ducts normal. Pancreas: Negative for mass or edema or calcification of pancreas Spleen: Negative Adrenals/Urinary Tract: 3 mm, and 5 mm left midpole calculi. No hydronephrosis or ureteral calculus. No renal calculi on the right. No renal obstruction on the right. No mass lesion. Stomach/Bowel: Prior gastric surgery. Small hiatal hernia.  Negative for bowel obstruction. Negative for bowel mass or edema. Mild sigmoid diverticulosis without diverticulitis. Appendix nonvisualized. Vascular/Lymphatic: Mild atherosclerotic aorta without aneurysm. Negative for lymphadenopathy. Reproductive: Hysterectomy.  No pelvic mass Other: Negative for hernia Musculoskeletal: Right hip replacement. Lumbar fusion  L4-5. Lumbar disc degeneration. No acute skeletal abnormality. IMPRESSION: 1. Two small left renal calculi in the midpole. No renal obstruction or ureteral calculus. Negative right kidney 2. Mild sigmoid diverticulosis. No diverticulitis and no acute abnormality. Electronically Signed   By: Franchot Gallo M.D.   On: 01/10/2019 20:34    Assessment/Plan Principal Problem:   UTI (urinary tract infection) Active Problems:   HTN (hypertension)   GERD (gastroesophageal reflux disease)   Anxiety   Tobacco use   Complicated UTI/ mild pyelonephritis No fever, tachycardia, or hypotension.  No leukocytosis.  Creatinine 0.7, baseline.  UA with large amount of leukocytes, 6-10 RBCs, greater than 50 WBCs, and few bacteria.  Has left-sided CVA tenderness on exam. CT renal stone study showing 2 small left renal calculi in the midpole.  No renal obstruction or ureteral calculus.  Negative right kidney.   -IV fluid hydration -Ceftriaxone -IV antiemetic PRN -IV Toradol PRN pain, Tylenol PRN -Urine culture pending  Hypertension -Continue home amlodipine  GERD -Continue PPI  Anxiety, depression -Continue venlafaxine  Tobacco use -Counseling -NicoDerm patch  DVT prophylaxis: Subcutaneous heparin Code Status: Full code Family Communication: No family available at this time. Disposition Plan: Anticipate discharge after clinical improvement. Consults called: None  Admission status: It is my clinical opinion that referral for OBSERVATION is reasonable and necessary in this patient based on the above information provided. The aforementioned taken  together are felt to place the patient at high risk for further clinical deterioration. However it is anticipated that the patient may be medically stable for discharge from the hospital within 24 to 48 hours.  The medical decision making on this patient was of high complexity and the patient is at high risk for clinical deterioration, therefore this is a level 3 visit.  Shela Leff MD [facility] Pager [phone]  If 7PM-7AM, please contact night-coverage [email or web address] Password [facility]  01/10/2019, 10:57 PM

## 2019-01-10 NOTE — ED Triage Notes (Signed)
Pt reports that she finished antibiotics for UTI and ever since she has pains on right side again. Was sent by PCP for further work up since patient can't wait for out patient imaging to rule out kidney stone.

## 2019-01-11 ENCOUNTER — Encounter (HOSPITAL_COMMUNITY): Payer: Self-pay

## 2019-01-11 DIAGNOSIS — I10 Essential (primary) hypertension: Secondary | ICD-10-CM

## 2019-01-11 DIAGNOSIS — K219 Gastro-esophageal reflux disease without esophagitis: Secondary | ICD-10-CM

## 2019-01-11 DIAGNOSIS — N12 Tubulo-interstitial nephritis, not specified as acute or chronic: Secondary | ICD-10-CM | POA: Diagnosis not present

## 2019-01-11 DIAGNOSIS — N3000 Acute cystitis without hematuria: Secondary | ICD-10-CM

## 2019-01-11 DIAGNOSIS — F419 Anxiety disorder, unspecified: Secondary | ICD-10-CM

## 2019-01-11 DIAGNOSIS — Z720 Tobacco use: Secondary | ICD-10-CM

## 2019-01-11 LAB — CBC WITH DIFFERENTIAL/PLATELET
Abs Immature Granulocytes: 0.01 10*3/uL (ref 0.00–0.07)
Basophils Absolute: 0 10*3/uL (ref 0.0–0.1)
Basophils Relative: 0 %
Eosinophils Absolute: 0.1 10*3/uL (ref 0.0–0.5)
Eosinophils Relative: 2 %
HCT: 40.7 % (ref 36.0–46.0)
Hemoglobin: 12.6 g/dL (ref 12.0–15.0)
Immature Granulocytes: 0 %
Lymphocytes Relative: 26 %
Lymphs Abs: 1.3 10*3/uL (ref 0.7–4.0)
MCH: 28.5 pg (ref 26.0–34.0)
MCHC: 31 g/dL (ref 30.0–36.0)
MCV: 92.1 fL (ref 80.0–100.0)
Monocytes Absolute: 0.4 10*3/uL (ref 0.1–1.0)
Monocytes Relative: 7 %
Neutro Abs: 3.2 10*3/uL (ref 1.7–7.7)
Neutrophils Relative %: 65 %
Platelets: 199 10*3/uL (ref 150–400)
RBC: 4.42 MIL/uL (ref 3.87–5.11)
RDW: 13.5 % (ref 11.5–15.5)
WBC: 5 10*3/uL (ref 4.0–10.5)
nRBC: 0 % (ref 0.0–0.2)

## 2019-01-11 LAB — COMPREHENSIVE METABOLIC PANEL
ALT: 15 U/L (ref 0–44)
AST: 18 U/L (ref 15–41)
Albumin: 3.6 g/dL (ref 3.5–5.0)
Alkaline Phosphatase: 90 U/L (ref 38–126)
Anion gap: 7 (ref 5–15)
BUN: 27 mg/dL — ABNORMAL HIGH (ref 6–20)
CO2: 23 mmol/L (ref 22–32)
Calcium: 8.2 mg/dL — ABNORMAL LOW (ref 8.9–10.3)
Chloride: 109 mmol/L (ref 98–111)
Creatinine, Ser: 0.79 mg/dL (ref 0.44–1.00)
GFR calc Af Amer: >60 mL/min (ref >60)
GFR calc non Af Amer: >60 mL/min (ref >60)
Glucose, Bld: 106 mg/dL — ABNORMAL HIGH (ref 70–99)
Potassium: 3.6 mmol/L (ref 3.5–5.1)
Sodium: 139 mmol/L (ref 135–145)
Total Bilirubin: 1 mg/dL (ref 0.3–1.2)
Total Protein: 6.4 g/dL — ABNORMAL LOW (ref 6.5–8.1)

## 2019-01-11 LAB — HIV ANTIBODY (ROUTINE TESTING W REFLEX): HIV Screen 4th Generation wRfx: NONREACTIVE

## 2019-01-11 LAB — MAGNESIUM: Magnesium: 2.1 mg/dL (ref 1.7–2.4)

## 2019-01-11 MED ORDER — SODIUM CHLORIDE 0.9 % IV SOLN
8.0000 mg | Freq: Four times a day (QID) | INTRAVENOUS | Status: DC | PRN
  Filled 2019-01-11: qty 4

## 2019-01-11 MED ORDER — PHENAZOPYRIDINE HCL 200 MG PO TABS
200.0000 mg | ORAL_TABLET | Freq: Three times a day (TID) | ORAL | Status: DC
  Administered 2019-01-11 – 2019-01-13 (×5): 200 mg via ORAL
  Filled 2019-01-11 (×6): qty 1

## 2019-01-11 MED ORDER — KETOROLAC TROMETHAMINE 15 MG/ML IJ SOLN
30.0000 mg | Freq: Four times a day (QID) | INTRAMUSCULAR | Status: DC | PRN
  Administered 2019-01-11: 30 mg via INTRAVENOUS
  Filled 2019-01-11: qty 2

## 2019-01-11 MED ORDER — ONDANSETRON HCL 4 MG/2ML IJ SOLN: 4.0000 mg | Freq: Four times a day (QID) | INTRAMUSCULAR | Status: DC | PRN

## 2019-01-11 MED ORDER — SODIUM CHLORIDE 0.9 % IV SOLN
INTRAVENOUS | Status: AC
  Administered 2019-01-11 – 2019-01-13 (×5): via INTRAVENOUS

## 2019-01-11 MED ORDER — ENOXAPARIN SODIUM 40 MG/0.4ML ~~LOC~~ SOLN
40.0000 mg | SUBCUTANEOUS | Status: DC
  Administered 2019-01-11 – 2019-01-12 (×2): 40 mg via SUBCUTANEOUS
  Filled 2019-01-11 (×3): qty 0.4

## 2019-01-11 MED ORDER — ALUM & MAG HYDROXIDE-SIMETH 200-200-20 MG/5ML PO SUSP
30.0000 mL | Freq: Four times a day (QID) | ORAL | Status: DC | PRN
  Administered 2019-01-11: 30 mL via ORAL

## 2019-01-11 MED ORDER — HYDROCODONE-ACETAMINOPHEN 5-325 MG PO TABS
2.0000 | ORAL_TABLET | ORAL | Status: DC | PRN
  Administered 2019-01-11 – 2019-01-12 (×6): 2 via ORAL
  Filled 2019-01-11 (×7): qty 2

## 2019-01-11 MED ORDER — HYDROCODONE-ACETAMINOPHEN 5-325 MG PO TABS
2.0000 | ORAL_TABLET | Freq: Once | ORAL | Status: AC
  Administered 2019-01-11: 2 via ORAL
  Filled 2019-01-11: qty 2

## 2019-01-11 MED ORDER — ONDANSETRON HCL 4 MG/2ML IJ SOLN
4.0000 mg | Freq: Four times a day (QID) | INTRAMUSCULAR | Status: DC | PRN
  Administered 2019-01-11 (×2): 4 mg via INTRAVENOUS
  Filled 2019-01-11 (×2): qty 2

## 2019-01-11 NOTE — Progress Notes (Signed)
PROGRESS NOTE    [name]  [id number] DOB: 16-May-1961 DOA: 01/10/2019 PCP: Jani Gravel, MD    Brief Narrative:  HPI per Dr. Jonelle Sports is a 58 y.o. 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  Patient states she recently finished her course of Keflex and was initially feeling better but then symptoms recurred 3 days ago.  She is having left-sided flank pain, left-sided abdominal pain/discomfort, nausea, and dysuria.  She had a temperature of 102 F at home yesterday and was having chills.  No other complaints.  ED Course: No fever, tachycardia, or hypotension.  No leukocytosis.  Creatinine 0.7, baseline.  T bili mildly elevated at 1.6, was elevated 2 years ago as well.  Remainder of LFTs normal.  Lipase normal.  UA with large amount of leukocytes, 6-10 RBCs, greater than 50 WBCs, and few bacteria.  Urine culture pending.  COVID-19 test pending.  CT renal stone study showing 2 small left renal calculi in the midpole.  No renal obstruction or ureteral calculus.  Negative right kidney.  Patient received Toradol, Zofran, and ceftriaxone.  Assessment & Plan:   Principal Problem:   UTI (urinary tract infection) Active Problems:   HTN (hypertension)   GERD (gastroesophageal reflux disease)   Anxiety   Tobacco use  1 complicated UTI/probable mild pyelonephritis Patient presented with supra pubic abdominal pain, dysuria, left flank pain.  Patient recently finished a course of Keflex for UTI.  CT renal stone protocol negative for any renal stones or hydronephrosis.  Urine cultures pending.  Continue IV Rocephin.  Follow.  2.  Hypertension Continue home  regimen Norvasc.  3.  Gastroesophageal reflux disease PPI.  4.  Tobacco use Tobacco cessation.  Continue nicotine patch.  5.  Anxiety/depression Continue Effexor.    DVT prophylaxis: Lovenox Code Status: Full Family Communication: Updated patient.  No family at bedside. Disposition Plan: Likely home when clinically improved.   Consultants:   None  Procedures:   CT renal stone protocol 01/10/2019    Antimicrobials:   IV Rocephin 01/10/2019   Subjective: Patient laying in bed.  Complaining of suprapubic abdominal pain.  Patient also complained of left flank pain now also with some right flank pain.  Patient seems frustrated.  No chest pain.  No shortness of breath.  Patient complains of decreased appetite and some nausea.  Patient complaining of dysuria.  Objective: Vitals:   01/10/19 2100 01/10/19 2130 01/10/19 2200 01/11/19 0645  BP: 128/77 (!) 136/102 133/76 113/74  Pulse: (!) 54 (!) 55 (!) 47 (!) 49  Resp:    17  Temp:    98.1 F (36.7 C)  TempSrc:      SpO2: 96% 97% 96% 97%    Intake/Output Summary (Last 24 hours) at 01/11/2019 1041 Last data filed at 01/11/2019 0600 Gross per 24 hour  Intake 760.22 ml  Output --  Net 760.22 ml   There were no vitals filed for this visit.  Examination:  General exam: NAD Respiratory system: Clear to auscultation. Respiratory effort normal. Cardiovascular system: S1 & S2 heard, RRR. No JVD, murmurs, rubs, gallops or clicks. No pedal edema. Gastrointestinal system: Abdomen is nondistended, soft and some tenderness to palpation in the left flank.  No  rebound.  No guarding.  Positive bowel sounds.  Central nervous system: Alert and oriented. No focal neurological deficits. Extremities: Symmetric 5 x 5 power. Skin: No rashes, lesions or ulcers Psychiatry: Judgement and insight appear normal. Mood & affect appropriate.     Data Reviewed: I have personally reviewed following labs and imaging studies  CBC: Recent Labs   Lab 01/10/19 1556 01/11/19 0831  WBC 6.9 5.0  NEUTROABS  --  3.2  HGB 13.9 12.6  HCT 43.8 40.7  MCV 91.3 92.1  PLT 150 [id number]   Basic Metabolic Panel: Recent Labs  Lab 01/10/19 1556 01/11/19 0831  NA 140 139  K 4.1 3.6  CL 108 109  CO2 23 23  GLUCOSE 99 106*  BUN 23* 27*  CREATININE 0.77 0.79  CALCIUM 8.8* 8.2*  MG  --  2.1   GFR: CrCl cannot be calculated (Unknown ideal weight.). Liver Function Tests: Recent Labs  Lab 01/10/19 1556 01/11/19 0831  AST 22 18  ALT 18 15  ALKPHOS 101 90  BILITOT 1.6* 1.0  PROT 7.0 6.4*  ALBUMIN 4.0 3.6   Recent Labs  Lab 01/10/19 1556  LIPASE 45   No results for input(s): AMMONIA in the last 168 hours. Coagulation Profile: No results for input(s): INR, PROTIME in the last 168 hours. Cardiac Enzymes: No results for input(s): CKTOTAL, CKMB, CKMBINDEX, TROPONINI in the last 168 hours. BNP (last 3 results) No results for input(s): PROBNP in the last 8760 hours. HbA1C: No results for input(s): HGBA1C in the last 72 hours. CBG: No results for input(s): GLUCAP in the last 168 hours. Lipid Profile: No results for input(s): CHOL, HDL, LDLCALC, TRIG, CHOLHDL, LDLDIRECT in the last 72 hours. Thyroid Function Tests: No results for input(s): TSH, T4TOTAL, FREET4, T3FREE, THYROIDAB in the last 72 hours. Anemia Panel: No results for input(s): VITAMINB12, FOLATE, FERRITIN, TIBC, IRON, RETICCTPCT in the last 72 hours. Sepsis Labs: No results for input(s): PROCALCITON, LATICACIDVEN in the last 168 hours.  Recent Results (from the past 240 hour(s))  SARS Coronavirus 2 [facility] order, Performed in [facility] hospital lab)     Status: None   Collection Time: 01/10/19 10:15 PM  Result Value Ref Range Status   SARS Coronavirus 2 NEGATIVE NEGATIVE Final    Comment: (NOTE) If result is NEGATIVE SARS-CoV-2 target nucleic acids are NOT DETECTED. The SARS-CoV-2 RNA is generally detectable in upper and lower  respiratory specimens during the  acute phase of infection. The lowest  concentration of SARS-CoV-2 viral copies this assay can detect is 250  copies / mL. A negative result does not preclude SARS-CoV-2 infection  and should not be used as the sole basis for treatment or other  patient management decisions.  A negative result may occur with  improper specimen collection / handling, submission of specimen other  than nasopharyngeal swab, presence of viral mutation(s) within the  areas targeted by this assay, and inadequate number of viral copies  (<250 copies / mL). A negative result must be combined with clinical  observations, patient history, and epidemiological information. If result is POSITIVE SARS-CoV-2 target nucleic acids are DETECTED. The SARS-CoV-2 RNA is generally detectable in upper and lower  respiratory specimens dur ing the acute phase of infection.  Positive  results are indicative of active infection with SARS-CoV-2.  Clinical  correlation with patient history and other diagnostic information is  necessary to determine patient infection status.  Positive results do  not rule out bacterial infection or co-infection with other  viruses. If result is PRESUMPTIVE POSTIVE SARS-CoV-2 nucleic acids MAY BE PRESENT.   A presumptive positive result was obtained on the submitted specimen  and confirmed on repeat testing.  While 2019 novel coronavirus  (SARS-CoV-2) nucleic acids may be present in the submitted sample  additional confirmatory testing may be necessary for epidemiological  and / or clinical management purposes  to differentiate between  SARS-CoV-2 and other Sarbecovirus currently known to infect humans.  If clinically indicated additional testing with an alternate test  methodology [phone]) is advised. The SARS-CoV-2 RNA is generally  detectable in upper and lower respiratory sp ecimens during the acute  phase of infection. The expected result is Negative. Fact Sheet for Patients:   StrictlyIdeas.no Fact Sheet for Healthcare Providers: BankingDealers.co.za This test is not yet approved or cleared by the [place] FDA and has been authorized for detection and/or diagnosis of SARS-CoV-2 by FDA under an Emergency Use Authorization (EUA).  This EUA will remain in effect (meaning this test can be used) for the duration of the COVID-19 declaration under Section 564(b)(1) of the Act, 21 U.S.C. section 360bbb-3(b)(1), unless the authorization is terminated or revoked sooner. Performed at [facility], [place] [address] [place], [place] [address]          Radiology Studies: Ct Renal Stone Study  Result Date: 01/10/2019 CLINICAL DATA:  Left flank pain.  History of kidney stones.  UTI. EXAM: CT ABDOMEN AND PELVIS WITHOUT CONTRAST TECHNIQUE: Multidetector CT imaging of the abdomen and pelvis was performed following the standard protocol without IV contrast. COMPARISON:  CT abdomen pelvis 04/08/2018 FINDINGS: Lower chest: Mild scarring in the left lung base. No infiltrate or effusion in the lung bases. Hepatobiliary: Normal liver.  Gallbladder and bile ducts normal. Pancreas: Negative for mass or edema or calcification of pancreas Spleen: Negative Adrenals/Urinary Tract: 3 mm, and 5 mm left midpole calculi. No hydronephrosis or ureteral calculus. No renal calculi on the right. No renal obstruction on the right. No mass lesion. Stomach/Bowel: Prior gastric surgery. Small hiatal hernia. Negative for bowel obstruction. Negative for bowel mass or edema. Mild sigmoid diverticulosis without diverticulitis. Appendix nonvisualized. Vascular/Lymphatic: Mild atherosclerotic aorta without aneurysm. Negative for lymphadenopathy. Reproductive: Hysterectomy.  No pelvic mass Other: Negative for hernia Musculoskeletal: Right hip replacement. Lumbar fusion L4-5. Lumbar disc degeneration. No acute skeletal abnormality. IMPRESSION: 1. Two  small left renal calculi in the midpole. No renal obstruction or ureteral calculus. Negative right kidney 2. Mild sigmoid diverticulosis. No diverticulitis and no acute abnormality. Electronically Signed   By: Franchot Gallo M.D.   On: 01/10/2019 20:34        Scheduled Meds:  amLODipine  5 mg Oral Daily   enoxaparin (LOVENOX) injection  40 mg Subcutaneous Q24H   nicotine  7 mg Transdermal Daily   pantoprazole  40 mg Oral BID   venlafaxine XR  75 mg Oral Daily   Continuous Infusions:  sodium chloride 125 mL/hr at 01/11/19 0806   cefTRIAXone (ROCEPHIN)  IV       LOS: 0 days    Time spent: 35 minutes    Irine Seal, MD [facility]  If 7PM-7AM, please contact night-coverage [email or web address] 01/11/2019, 10:41 AM

## 2019-01-11 NOTE — Plan of Care (Signed)

## 2019-01-12 DIAGNOSIS — E559 Vitamin D deficiency, unspecified: Secondary | ICD-10-CM | POA: Diagnosis present

## 2019-01-12 DIAGNOSIS — N12 Tubulo-interstitial nephritis, not specified as acute or chronic: Secondary | ICD-10-CM | POA: Diagnosis present

## 2019-01-12 DIAGNOSIS — N200 Calculus of kidney: Secondary | ICD-10-CM | POA: Diagnosis present

## 2019-01-12 DIAGNOSIS — F329 Major depressive disorder, single episode, unspecified: Secondary | ICD-10-CM | POA: Diagnosis present

## 2019-01-12 DIAGNOSIS — G4733 Obstructive sleep apnea (adult) (pediatric): Secondary | ICD-10-CM | POA: Diagnosis present

## 2019-01-12 DIAGNOSIS — K760 Fatty (change of) liver, not elsewhere classified: Secondary | ICD-10-CM | POA: Diagnosis present

## 2019-01-12 DIAGNOSIS — Z90711 Acquired absence of uterus with remaining cervical stump: Secondary | ICD-10-CM | POA: Diagnosis not present

## 2019-01-12 DIAGNOSIS — E785 Hyperlipidemia, unspecified: Secondary | ICD-10-CM | POA: Diagnosis present

## 2019-01-12 DIAGNOSIS — Z20828 Contact with and (suspected) exposure to other viral communicable diseases: Secondary | ICD-10-CM | POA: Diagnosis present

## 2019-01-12 DIAGNOSIS — E669 Obesity, unspecified: Secondary | ICD-10-CM | POA: Diagnosis present

## 2019-01-12 DIAGNOSIS — R109 Unspecified abdominal pain: Secondary | ICD-10-CM | POA: Diagnosis present

## 2019-01-12 DIAGNOSIS — Z888 Allergy status to other drugs, medicaments and biological substances status: Secondary | ICD-10-CM | POA: Diagnosis not present

## 2019-01-12 DIAGNOSIS — K219 Gastro-esophageal reflux disease without esophagitis: Secondary | ICD-10-CM | POA: Diagnosis present

## 2019-01-12 DIAGNOSIS — I10 Essential (primary) hypertension: Secondary | ICD-10-CM | POA: Diagnosis present

## 2019-01-12 DIAGNOSIS — E892 Postprocedural hypoparathyroidism: Secondary | ICD-10-CM | POA: Diagnosis present

## 2019-01-12 DIAGNOSIS — Z79899 Other long term (current) drug therapy: Secondary | ICD-10-CM | POA: Diagnosis not present

## 2019-01-12 DIAGNOSIS — Z885 Allergy status to narcotic agent status: Secondary | ICD-10-CM | POA: Diagnosis not present

## 2019-01-12 DIAGNOSIS — Z6838 Body mass index (BMI) 38.0-38.9, adult: Secondary | ICD-10-CM | POA: Diagnosis not present

## 2019-01-12 DIAGNOSIS — Z8249 Family history of ischemic heart disease and other diseases of the circulatory system: Secondary | ICD-10-CM | POA: Diagnosis not present

## 2019-01-12 DIAGNOSIS — N3000 Acute cystitis without hematuria: Secondary | ICD-10-CM | POA: Diagnosis not present

## 2019-01-12 DIAGNOSIS — Z87442 Personal history of urinary calculi: Secondary | ICD-10-CM | POA: Diagnosis not present

## 2019-01-12 DIAGNOSIS — R7303 Prediabetes: Secondary | ICD-10-CM | POA: Diagnosis present

## 2019-01-12 DIAGNOSIS — F40240 Claustrophobia: Secondary | ICD-10-CM | POA: Diagnosis present

## 2019-01-12 DIAGNOSIS — B9620 Unspecified Escherichia coli [E. coli] as the cause of diseases classified elsewhere: Secondary | ICD-10-CM | POA: Diagnosis present

## 2019-01-12 DIAGNOSIS — D241 Benign neoplasm of right breast: Secondary | ICD-10-CM | POA: Diagnosis present

## 2019-01-12 LAB — CBC WITH DIFFERENTIAL/PLATELET
Abs Immature Granulocytes: 0.02 10*3/uL (ref 0.00–0.07)
Basophils Absolute: 0 10*3/uL (ref 0.0–0.1)
Basophils Relative: 1 %
Eosinophils Absolute: 0.2 10*3/uL (ref 0.0–0.5)
Eosinophils Relative: 3 %
HCT: 43.2 % (ref 36.0–46.0)
Hemoglobin: 13.3 g/dL (ref 12.0–15.0)
Immature Granulocytes: 0 %
Lymphocytes Relative: 33 %
Lymphs Abs: 1.9 10*3/uL (ref 0.7–4.0)
MCH: 29.2 pg (ref 26.0–34.0)
MCHC: 30.8 g/dL (ref 30.0–36.0)
MCV: 94.7 fL (ref 80.0–100.0)
Monocytes Absolute: 0.4 10*3/uL (ref 0.1–1.0)
Monocytes Relative: 7 %
Neutro Abs: 3.4 10*3/uL (ref 1.7–7.7)
Neutrophils Relative %: 56 %
Platelets: 221 10*3/uL (ref 150–400)
RBC: 4.56 MIL/uL (ref 3.87–5.11)
RDW: 13.4 % (ref 11.5–15.5)
WBC: 6 10*3/uL (ref 4.0–10.5)
nRBC: 0 % (ref 0.0–0.2)

## 2019-01-12 LAB — BASIC METABOLIC PANEL
Anion gap: 5 (ref 5–15)
BUN: 22 mg/dL — ABNORMAL HIGH (ref 6–20)
CO2: 25 mmol/L (ref 22–32)
Calcium: 8.2 mg/dL — ABNORMAL LOW (ref 8.9–10.3)
Chloride: 110 mmol/L (ref 98–111)
Creatinine, Ser: 0.77 mg/dL (ref 0.44–1.00)
GFR calc Af Amer: >60 mL/min (ref >60)
GFR calc non Af Amer: >60 mL/min (ref >60)
Glucose, Bld: 94 mg/dL (ref 70–99)
Potassium: 4 mmol/L (ref 3.5–5.1)
Sodium: 140 mmol/L (ref 135–145)

## 2019-01-12 MED ORDER — FENTANYL CITRATE (PF) 100 MCG/2ML IJ SOLN
25.0000 ug | Freq: Once | INTRAMUSCULAR | Status: AC
  Administered 2019-01-12: 01:00:00 25 ug via INTRAVENOUS
  Filled 2019-01-12: qty 2

## 2019-01-12 MED ORDER — DIPHENHYDRAMINE HCL 25 MG PO CAPS
25.0000 mg | ORAL_CAPSULE | Freq: Four times a day (QID) | ORAL | Status: DC | PRN
  Administered 2019-01-12 (×2): 25 mg via ORAL
  Filled 2019-01-12 (×2): qty 1

## 2019-01-12 NOTE — Progress Notes (Signed)
PROGRESS NOTE  [name] [id number] DOB: 11-15-1960 DOA: 01/10/2019 PCP: Jani Gravel, MD  Brief History   58 year old Caucasian female-Prior multiple kidney stones about 10 to 12 years ago, BMI 38+ fatty liver, HLD, hyperparathyroidism, OSA not on CPAP prediabetes, papilloma right breast status post lumpectomy with seed localization Rx X2 recently-last Rx 12/25/2018 E. coli cystitis placed on Keflex Returns to ED T-max 102, large leukocytes WBCs few bacteria CT showed 2 small left renal calculi no pyelo-  A & P  Early pyelonephritis with E. coli Nauseous last night, tender still on left flank-E. 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 monitor trends-outpatient supervised weight loss needed Continue Protonix and other medications Follow-up with Dr. Lucia Gaskins in the outpatient setting Hyperparathyroidism status post parathyroidectomy 2008 This arguably resolved the kidney stones that she had previously OSA not using CPAP secondary to claustrophobia Low back pain secondary to obesity surgeon Dr. Carloyn Manner Outpatient multimodal management Right breast papilloma Status post lumpectomy with seed implantation 02/05/2018-outpatient surveillance as well as imaging per surgeon HTN Continue Norvasc 5 Depression Continue Effexor   DVT prophylaxis: Lovenox Code Status: Presumed full Family Communication: None present Disposition Plan: Inpatient pending resolution likely can discharge a.m. once cultures finalized and if symptoms have improved   Verneita Griffes, MD [facility] 2:37 PM  01/12/2019, 2:37 PM  LOS: 0 days   Consultants  .   Procedures  .    Antibiotics  . Ceftriaxone  Interval History/Subjective  Nausea overnight no vomiting tender in left lower quadrant has not been out of bed much feels only minimally better No fever no chills  Objective   Vitals:  Vitals:   01/12/19 0540 01/12/19 1337  BP: (!) 146/75 116/73  Pulse: (!) 45 (!) 49  Resp: 14 16  Temp: 97.6 F (36.4 C) 98.1 F (36.7 C)  SpO2: 94% 95%    Exam: Awake alert coherent flat affect thick neck poor dentition Chest clear no added sound S1-S2 no murmur rub or gallop Abdomen tender in left lower quadrant and left flank with CVA tenderness No lower extremity edema Chest is clinically clear Neurologically intact    I have personally reviewed the following:   Today's Data  .   Lab Data  . BUN/creatinine 22/0.7 . White count normal  Scheduled Meds: . amLODipine  5 mg Oral Daily  . enoxaparin (LOVENOX) injection  40 mg Subcutaneous Q24H  . nicotine  7 mg Transdermal Daily  . pantoprazole  40 mg Oral BID  . phenazopyridine  200 mg Oral TID WC  . venlafaxine XR  75 mg Oral Daily   Continuous Infusions: . sodium chloride 125 mL/hr at 01/12/19 0812  . cefTRIAXone (ROCEPHIN)  IV 1 g (01/11/19 2033)  . ondansetron (ZOFRAN) IV      Principal Problem:   UTI (urinary tract infection) Active Problems:   HTN (hypertension)   GERD (gastroesophageal reflux disease)   Anxiety   Tobacco use   Pyelonephritis   LOS: 0 days   How to contact the [facility] Attending or Consulting provider 7A - 7P or covering provider during after hours [place], for this patient?  1. Check the care team in [facility] and look  for a) attending/consulting [place] provider listed and b) the [facility] team listed 2. Log into [email or web address] and use [place]'s universal password to access. If you do not have the password, please contact the hospital operator. 3. Locate the [facility] provider you are looking for under [facility] and page to a number that you can be directly reached. 4. If you  still have difficulty reaching the provider, please page the [facility] (Director on Call) for the Hospitalists listed on amion for assistance.

## 2019-01-13 ENCOUNTER — Encounter: Payer: Self-pay | Admitting: Family Medicine

## 2019-01-13 LAB — URINE CULTURE: Culture: >=100000 — AB

## 2019-01-13 MED ORDER — NICOTINE 7 MG/24HR TD PT24: 7.0000 mg | MEDICATED_PATCH | Freq: Every day | TRANSDERMAL | 0 refills | Status: DC

## 2019-01-13 MED ORDER — PHENAZOPYRIDINE HCL 200 MG PO TABS: 200.0000 mg | ORAL_TABLET | Freq: Three times a day (TID) | ORAL | 0 refills | Status: DC

## 2019-01-13 MED ORDER — SULFAMETHOXAZOLE-TRIMETHOPRIM 800-160 MG PO TABS: 1.0000 | ORAL_TABLET | Freq: Two times a day (BID) | ORAL | 0 refills | Status: AC

## 2019-01-13 NOTE — Plan of Care (Signed)

## 2019-01-13 NOTE — Discharge Summary (Signed)
Physician Discharge Summary  [name] DJM:[id number] DOB: April 01, 1961 DOA: 01/10/2019  PCP: Jani Gravel, MD  Admit date: 01/10/2019 Discharge date: 01/13/2019  Time spent: 25 minutes  Recommendations for Outpatient Follow-up:  1. Complete all antibiotics 2. Work note given until 8/31 3. 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 coli cystitis placed on Keflex Returns to ED T-max 102, large leukocytes WBCs few bacteria CT showed 2 small left renal calculi no pyelo-  Hospital Course:  Early pyelonephritis with E. coli Nauseous last night, tender still on left flank-E. 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 Lucia Gaskins in the outpatient setting Hyperparathyroidism status post parathyroidectomy 2008 This arguably resolved the kidney stones that she had previously OSA not using CPAP secondary to claustrophobia Low back pain secondary to obesity surgeon Dr. Carloyn Manner Outpatient multimodal management Right breast papilloma Status post lumpectomy with seed  implantation 02/05/2018-outpatient surveillance as well as imaging per surgeon HTN Continue Norvasc 5 Depression Continue Effexor  Consultations:  None  Discharge Exam: Vitals:   01/12/19 2049 01/13/19 0451  BP: 118/69 129/75  Pulse: (!) 58 (!) 48  Resp: 17 15  Temp: 98.3 F (36.8 C) 98 F (36.7 C)  SpO2: 95% 95%    General: Awake alert coherent no distress Cardiovascular: S1-S2 no murmur rub or gallop Respiratory: Clinically clear no added sound Abdomen soft nontender no rebound no guarding Neurologically intact  Discharge Instructions   Discharge Instructions    Diet - low sodium heart healthy   Complete by: As directed    Discharge instructions   Complete by: As directed    Recommend completion of Bactrim DS we will give you a total of treatment days 14 and you should take all of these medications without fail Recommend that you quit smoking and use nicotine patch that is been prescribed to you Recommend also that you follow-up with your primary physician and may be see a urologist if you continue to have urinary symptoms and pains-you will not be prescribed high-dose narcotics on discharge and I would suggest use of Pyridium going forward only short term   Increase activity slowly   Complete by: As directed      Allergies as of 01/13/2019      Reactions   Cortisone Swelling    SWELLING REACTION UNSPECIFIED    Prednisone Swelling   SWELLING REACTION UNSPECIFIED    Hydrocodone Itching      Medication List    STOP taking these medications   aspirin EC 81 MG tablet   cephALEXin 500 MG capsule Commonly known as: KEFLEX   guaiFENesin 600 MG 12 hr tablet Commonly known as: Mucinex  HYDROcodone-homatropine 5-1.5 MG/5ML syrup Commonly known as: HYCODAN   naproxen 500 MG tablet Commonly known as: NAPROSYN   pantoprazole 40 MG tablet Commonly known as: PROTONIX     TAKE these medications   acetaminophen 500 MG tablet Commonly known as: TYLENOL Take  1,000 mg by mouth every 6 (six) hours as needed for moderate pain.   amLODipine 5 MG tablet Commonly known as: NORVASC Take 5 mg by mouth daily.   esomeprazole 20 MG capsule Commonly known as: NEXIUM Take 20 mg by mouth at bedtime.   nicotine 7 mg/24hr patch Commonly known as: NICODERM CQ - dosed in mg/24 hr Place 1 patch (7 mg total) onto the skin daily.   phenazopyridine 200 MG tablet Commonly known as: PYRIDIUM Take 1 tablet (200 mg total) by mouth 3 (three) times daily with meals. What changed: when to take this   sulfamethoxazole-trimethoprim 800-160 MG tablet Commonly known as: BACTRIM DS Take 1 tablet by mouth 2 (two) times daily for 10 days.   traMADol 50 MG tablet Commonly known as: Ultram Take 1 tablet (50 mg total) by mouth every 6 (six) hours as needed for moderate pain.   venlafaxine XR 75 MG 24 hr capsule Commonly known as: EFFEXOR-XR Take 75 mg by mouth daily.   Vitamin D-3 125 MCG (5000 UT) Tabs Take 5,000 Units by mouth every morning.      Allergies  Allergen Reactions  . Cortisone Swelling     SWELLING REACTION UNSPECIFIED   . Prednisone Swelling    SWELLING REACTION UNSPECIFIED   . Hydrocodone Itching      The results of significant diagnostics from this hospitalization (including imaging, microbiology, ancillary and laboratory) are listed below for reference.    Significant Diagnostic Studies: Ct Renal Stone Study  Result Date: 01/10/2019 CLINICAL DATA:  Left flank pain.  History of kidney stones.  UTI. EXAM: CT ABDOMEN AND PELVIS WITHOUT CONTRAST TECHNIQUE: Multidetector CT imaging of the abdomen and pelvis was performed following the standard protocol without IV contrast. COMPARISON:  CT abdomen pelvis 04/08/2018 FINDINGS: Lower chest: Mild scarring in the left lung base. No infiltrate or effusion in the lung bases. Hepatobiliary: Normal liver.  Gallbladder and bile ducts normal. Pancreas: Negative for mass or edema or calcification of  pancreas Spleen: Negative Adrenals/Urinary Tract: 3 mm, and 5 mm left midpole calculi. No hydronephrosis or ureteral calculus. No renal calculi on the right. No renal obstruction on the right. No mass lesion. Stomach/Bowel: Prior gastric surgery. Small hiatal hernia. Negative for bowel obstruction. Negative for bowel mass or edema. Mild sigmoid diverticulosis without diverticulitis. Appendix nonvisualized. Vascular/Lymphatic: Mild atherosclerotic aorta without aneurysm. Negative for lymphadenopathy. Reproductive: Hysterectomy.  No pelvic mass Other: Negative for hernia Musculoskeletal: Right hip replacement. Lumbar fusion L4-5. Lumbar disc degeneration. No acute skeletal abnormality. IMPRESSION: 1. Two small left renal calculi in the midpole. No renal obstruction or ureteral calculus. Negative right kidney 2. Mild sigmoid diverticulosis. No diverticulitis and no acute abnormality. Electronically Signed   By: Franchot Gallo M.D.   On: 01/10/2019 20:34    Microbiology: Recent Results (from the past 240 hour(s))  Urine culture     Status: Abnormal   Collection Time: 01/10/19  3:27 PM   Specimen: Urine, Clean Catch  Result Value Ref Range Status   Specimen Description   Final    URINE, CLEAN CATCH Performed at [facility], [place] [address] [place], [place] [address]    Special Requests   Final    NONE  Performed at [facility], [place] [address] [place], [place] [address]    Culture >=100,000 COLONIES/mL ESCHERICHIA COLI (A)  Final   Report Status 01/13/2019 FINAL  Final   Organism ID, Bacteria ESCHERICHIA COLI (A)  Final      Susceptibility   Escherichia coli - MIC*    AMPICILLIN >=32 RESISTANT Resistant     CEFAZOLIN <=4 SENSITIVE Sensitive     CEFTRIAXONE <=1 SENSITIVE Sensitive     CIPROFLOXACIN >=4 RESISTANT Resistant     GENTAMICIN <=1 SENSITIVE Sensitive     IMIPENEM <=0.25 SENSITIVE Sensitive     NITROFURANTOIN <=16 SENSITIVE Sensitive      TRIMETH/SULFA <=20 SENSITIVE Sensitive     AMPICILLIN/SULBACTAM 16 INTERMEDIATE Intermediate     PIP/TAZO <=4 SENSITIVE Sensitive     Extended ESBL NEGATIVE Sensitive     * >=100,000 COLONIES/mL ESCHERICHIA COLI  SARS Coronavirus 2 [facility] order, Performed in [place] hospital lab)     Status: None   Collection Time: 01/10/19 10:15 PM  Result Value Ref Range Status   SARS Coronavirus 2 NEGATIVE NEGATIVE Final    Comment: (NOTE) If result is NEGATIVE SARS-CoV-2 target nucleic acids are NOT DETECTED. The SARS-CoV-2 RNA is generally detectable in upper and lower  respiratory specimens during the acute phase of infection. The lowest  concentration of SARS-CoV-2 viral copies this assay can detect is 250  copies / mL. A negative result does not preclude SARS-CoV-2 infection  and should not be used as the sole basis for treatment or other  patient management decisions.  A negative result may occur with  improper specimen collection / handling, submission of specimen other  than nasopharyngeal swab, presence of viral mutation(s) within the  areas targeted by this assay, and inadequate number of viral copies  (<250 copies / mL). A negative result must be combined with clinical  observations, patient history, and epidemiological information. If result is POSITIVE SARS-CoV-2 target nucleic acids are DETECTED. The SARS-CoV-2 RNA is generally detectable in upper and lower  respiratory specimens dur ing the acute phase of infection.  Positive  results are indicative of active infection with SARS-CoV-2.  Clinical  correlation with patient history and other diagnostic information is  necessary to determine patient infection status.  Positive results do  not rule out bacterial infection or co-infection with other viruses. If result is PRESUMPTIVE POSTIVE SARS-CoV-2 nucleic acids MAY BE PRESENT.   A presumptive positive result was obtained on the submitted specimen  and confirmed on repeat  testing.  While 2019 novel coronavirus  (SARS-CoV-2) nucleic acids may be present in the submitted sample  additional confirmatory testing may be necessary for epidemiological  and / or clinical management purposes  to differentiate between  SARS-CoV-2 and other Sarbecovirus currently known to infect humans.  If clinically indicated additional testing with an alternate test  methodology [phone]) is advised. The SARS-CoV-2 RNA is generally  detectable in upper and lower respiratory sp ecimens during the acute  phase of infection. The expected result is Negative. Fact Sheet for Patients:  StrictlyIdeas.no Fact Sheet for Healthcare Providers: BankingDealers.co.za This test is not yet approved or cleared by the [place] FDA and has been authorized for detection and/or diagnosis of SARS-CoV-2 by FDA under an Emergency Use Authorization (EUA).  This EUA will remain in effect (meaning this test can be used) for the duration of the COVID-19 declaration under Section 564(b)(1) of the Act, 21 U.S.C. section 360bbb-3(b)(1), unless the authorization is terminated or revoked  sooner. Performed at [facility], [place] [address], [place], [place] [address]      Labs: Basic Metabolic Panel: Recent Labs  Lab 01/10/19 1556 01/11/19 0831 01/12/19 0304  NA 140 139 140  K 4.1 3.6 4.0  CL 108 109 110  CO2 _0 GLUCOSE 99 106* 94  BUN 23* 27* 22*  CREATININE 0.77 0.79 0.77  CALCIUM 8.8* 8.2* 8.2*  MG  --  2.1  --    Liver Function Tests: Recent Labs  Lab 01/10/19 1556 01/11/19 0831  AST 22 18  ALT 18 15  ALKPHOS 101 90  BILITOT 1.6* 1.0  PROT 7.0 6.4*  ALBUMIN 4.0 3.6   Recent Labs  Lab 01/10/19 1556  LIPASE 45   No results for input(s): AMMONIA in the last 168 hours. CBC: Recent Labs  Lab 01/10/19 1556 01/11/19 0831 01/12/19 0304  WBC 6.9 5.0 6.0  NEUTROABS  --  3.2 3.4  HGB 13.9 12.6 13.3  HCT  43.8 40.7 43.2  MCV 91.3 92.1 94.7  PLT 150 199 221   Cardiac Enzymes: No results for input(s): CKTOTAL, CKMB, CKMBINDEX, TROPONINI in the last 168 hours. BNP: BNP (last 3 results) No results for input(s): BNP in the last 8760 hours.  ProBNP (last 3 results) No results for input(s): PROBNP in the last 8760 hours.  CBG: No results for input(s): GLUCAP in the last 168 hours.     Signed:  Nita Sells MD   [facility] 01/13/2019, 9:18 AM

## 2019-06-08 ENCOUNTER — Other Ambulatory Visit: Payer: Self-pay

## 2019-06-08 ENCOUNTER — Encounter (HOSPITAL_COMMUNITY): Payer: Self-pay

## 2019-06-08 ENCOUNTER — Ambulatory Visit (HOSPITAL_COMMUNITY)
Admission: EM | Admit: 2019-06-08 | Discharge: 2019-06-08 | Disposition: A | Discharge status: Home / Self Care | Payer: Managed Care, Other (non HMO) | Attending: Urgent Care | Admitting: Urgent Care

## 2019-06-08 DIAGNOSIS — Z20822 Contact with and (suspected) exposure to covid-19: Secondary | ICD-10-CM | POA: Insufficient documentation

## 2019-06-08 DIAGNOSIS — B349 Viral infection, unspecified: Secondary | ICD-10-CM | POA: Insufficient documentation

## 2019-06-08 DIAGNOSIS — R05 Cough: Secondary | ICD-10-CM | POA: Diagnosis present

## 2019-06-08 DIAGNOSIS — R058 Other specified cough: Secondary | ICD-10-CM

## 2019-06-08 DIAGNOSIS — R059 Cough, unspecified: Secondary | ICD-10-CM

## 2019-06-08 MED ORDER — PROMETHAZINE-DM 6.25-15 MG/5ML PO SYRP: 5.0000 mL | ORAL_SOLUTION | Freq: Every evening | ORAL | 0 refills | Status: DC | PRN

## 2019-06-08 MED ORDER — BENZONATATE 100 MG PO CAPS: 100.0000 mg | ORAL_CAPSULE | Freq: Three times a day (TID) | ORAL | 0 refills | Status: DC | PRN

## 2019-06-08 NOTE — ED Triage Notes (Signed)
Pt states she was exposed last Tuesday to a COVID while at work. Now c/o cough, sniffles, sore throat, HA, congestion, general malaise, x2 days. Denies body aches, n/v/d or abd pain, or SOB.

## 2019-06-08 NOTE — ED Provider Notes (Signed)
[facility]   MRN: [id number] DOB: 02-14-1961  Subjective:   [name] is a 59 y.o. female presenting for 2-day history of acute onset persistent and worsening malaise, fatigue, dry cough, runny and stuffy nose, throat pain, headache.  Patient had exposure to multiple people that tested positive for COVID-19.  No current facility-administered medications for this encounter.  Current Outpatient Medications:  .  amLODipine (NORVASC) 5 MG tablet, Take 5 mg by mouth daily., Disp: , Rfl:  .  Cholecalciferol (VITAMIN D-3) 5000 UNITS TABS, Take 5,000 Units by mouth every morning. , Disp: , Rfl:  .  esomeprazole (NEXIUM) 20 MG capsule, Take 20 mg by mouth at bedtime., Disp: , Rfl:  .  venlafaxine XR (EFFEXOR-XR) 75 MG 24 hr capsule, Take 75 mg by mouth daily., Disp: , Rfl: 12 .  acetaminophen (TYLENOL) 500 MG tablet, Take 1,000 mg by mouth every 6 (six) hours as needed for moderate pain., Disp: , Rfl:    Allergies  Allergen Reactions  . Cortisone Swelling     SWELLING REACTION UNSPECIFIED   . Prednisone Swelling    SWELLING REACTION UNSPECIFIED   . Hydrocodone Itching    Past Medical History:  Diagnosis Date  . Anxiety   . Depression   . Fatty liver   . History of kidney stones   . Hyperlipidemia   . Hyperparathyroidism ([place])   . Hypertension   . Nephrolithiasis   . Obesity   . OSA (obstructive sleep apnea) 03/09/2013   denies; does not use CPAP   . Pre-diabetes    last A1C in 2015 5.9% see 01-13-14 lab epic   . Sleep apnea   . Vitamin D deficiency   . Wound, breast    superficial appearing , reddened area to left upper breast; per patient, has been in sun recently , denies pain or drainage , "just itchy"      Past Surgical History:  Procedure Laterality Date  . BACK SURGERY    . BREAST LUMPECTOMY WITH RADIOACTIVE SEED LOCALIZATION Right 02/05/2018   Procedure: BREAST LUMPECTOMY WITH RADIOACTIVE SEED LOCALIZATION;  Surgeon: Alphonsa Overall, MD;  Location: [facility];   Service: General;  Laterality: Right;  . BREATH TEK H PYLORI N/A 08/31/2014   Procedure: BREATH TEK H PYLORI;  Surgeon: Alphonsa Overall, MD;  Location: [facility];  Service: General;  Laterality: N/A;  . LAPAROSCOPIC GASTRIC SLEEVE RESECTION N/A 02/20/2015   Procedure: LAPAROSCOPIC GASTRIC SLEEVE RESECTION;  Surgeon: Alphonsa Overall, MD;  Location: [facility];  Service: General;  Laterality: N/A;  . PARTIAL HYSTERECTOMY    . sleep study  04/08/13  . THYROID SURGERY    . TOTAL HIP ARTHROPLASTY Right 02/27/2017   Procedure: RIGHT TOTAL HIP ARTHROPLASTY ANTERIOR APPROACH;  Surgeon: Dorna Leitz, MD;  Location: [facility];  Service: Orthopedics;  Laterality: Right;  Panel Lenght: 120 mins    Family History  Problem Relation Age of Onset  . COPD Mother   . Hypertension Mother   . Heart disease Mother   . Heart attack Mother   . Heart attack Father        died age 38 with the heart attack  . Heart disease Father   . Hypertension Sister   . Hypertension Brother   . Hypertension Sister   . Other Son        died age 86, autopsy pending possible aneurysm  . Colon cancer Neg Hx   . Breast cancer Neg Hx     Social History  Tobacco Use  . Smoking status: Current Every Day Smoker    Packs/day: 0.25    Years: 20.00    Pack years: 5.00    Types: Cigarettes  . Smokeless tobacco: Never Used  Substance Use Topics  . Alcohol use: No    Alcohol/week: 0.0 standard drinks  . Drug use: No    ROS Denies fever, chest pain, shortness of breath, body aches, nausea, vomiting, diarrhea, abdominal pain.  Objective:   Vitals: BP 137/86 (BP Location: Right Arm)   Pulse 63   Temp 98.1 F (36.7 C) (Oral)   Resp 18   SpO2 97%   Physical Exam Constitutional:      General: She is not in acute distress.    Appearance: Normal appearance. She is well-developed. She is not ill-appearing, toxic-appearing or diaphoretic.  HENT:     Head: Normocephalic and atraumatic.     Nose: Nose normal.     Mouth/Throat:       Mouth: Mucous membranes are moist.  Eyes:     Extraocular Movements: Extraocular movements intact.     Pupils: Pupils are equal, round, and reactive to light.  Cardiovascular:     Rate and Rhythm: Normal rate and regular rhythm.     Pulses: Normal pulses.     Heart sounds: Normal heart sounds. No murmur. No friction rub. No gallop.   Pulmonary:     Effort: Pulmonary effort is normal. No respiratory distress.     Breath sounds: Normal breath sounds. No stridor. No wheezing, rhonchi or rales.  Skin:    General: Skin is warm and dry.     Findings: No rash.  Neurological:     Mental Status: She is alert and oriented to person, place, and time.  Psychiatric:        Mood and Affect: Mood normal.        Behavior: Behavior normal.        Thought Content: Thought content normal.     Assessment and Plan :   1. Viral illness   2. Cough   3. Cough with exposure to COVID-19 virus     Will manage for viral illness such as viral URI, viral rhinitis, possible COVID-19. Counseled patient on nature of COVID-19 including modes of transmission, diagnostic testing, management and supportive care.  Offered symptomatic relief. COVID 19 testing is pending. Counseled patient on potential for adverse effects with medications prescribed/recommended today, ER and return-to-clinic precautions discussed, patient verbalized understanding.     Jaynee Eagles, PA-C 06/08/19 1340

## 2019-06-08 NOTE — Discharge Instructions (Signed)
We will notify you of your COVID-19 test results as they arrive and may take between 2 to 7 days.  In the meantime, if you develop worsening symptoms including fever, chest pain, shortness of breath despite our current treatment plan then please report to the emergency room as this may be a sign of worsening status from possible COVID-19 infection.  We will manage this as a viral syndrome. For sore throat or cough try using a honey-based tea. Use 3 teaspoons of honey with juice squeezed from half lemon. Place shaved pieces of ginger into 1/2-1 cup of water and warm over stove top. Then mix the ingredients and repeat every 4 hours as needed. Please take Tylenol 500mg  every 6 hours. Hydrate very well with at least 2 liters of water. Eat light meals such as soups to replenish electrolytes and soft fruits, veggies. Start an antihistamine like Zyrtec, Allegra or Claritin for postnasal drainage, sinus congestion.  You can take this together with pseudoephedrine (Sudafed) at a dose of 60 mg 3 times a day or twice daily as needed for the same kind of congestion.

## 2019-06-09 LAB — NOVEL CORONAVIRUS, NAA (HOSP ORDER, SEND-OUT TO REF LAB; TAT 18-24 HRS): SARS-CoV-2, NAA: NOT DETECTED

## 2019-06-28 IMAGING — MG BREAST SURGICAL SPECIMEN
1 series · 1 of 1 positions shown · non-contrast
Comparison: Previous exam(s).

CLINICAL DATA: Specimen radiograph status post right breast
excisional biopsy.

EXAM:
SPECIMEN RADIOGRAPH OF THE RIGHT BREAST

[R]
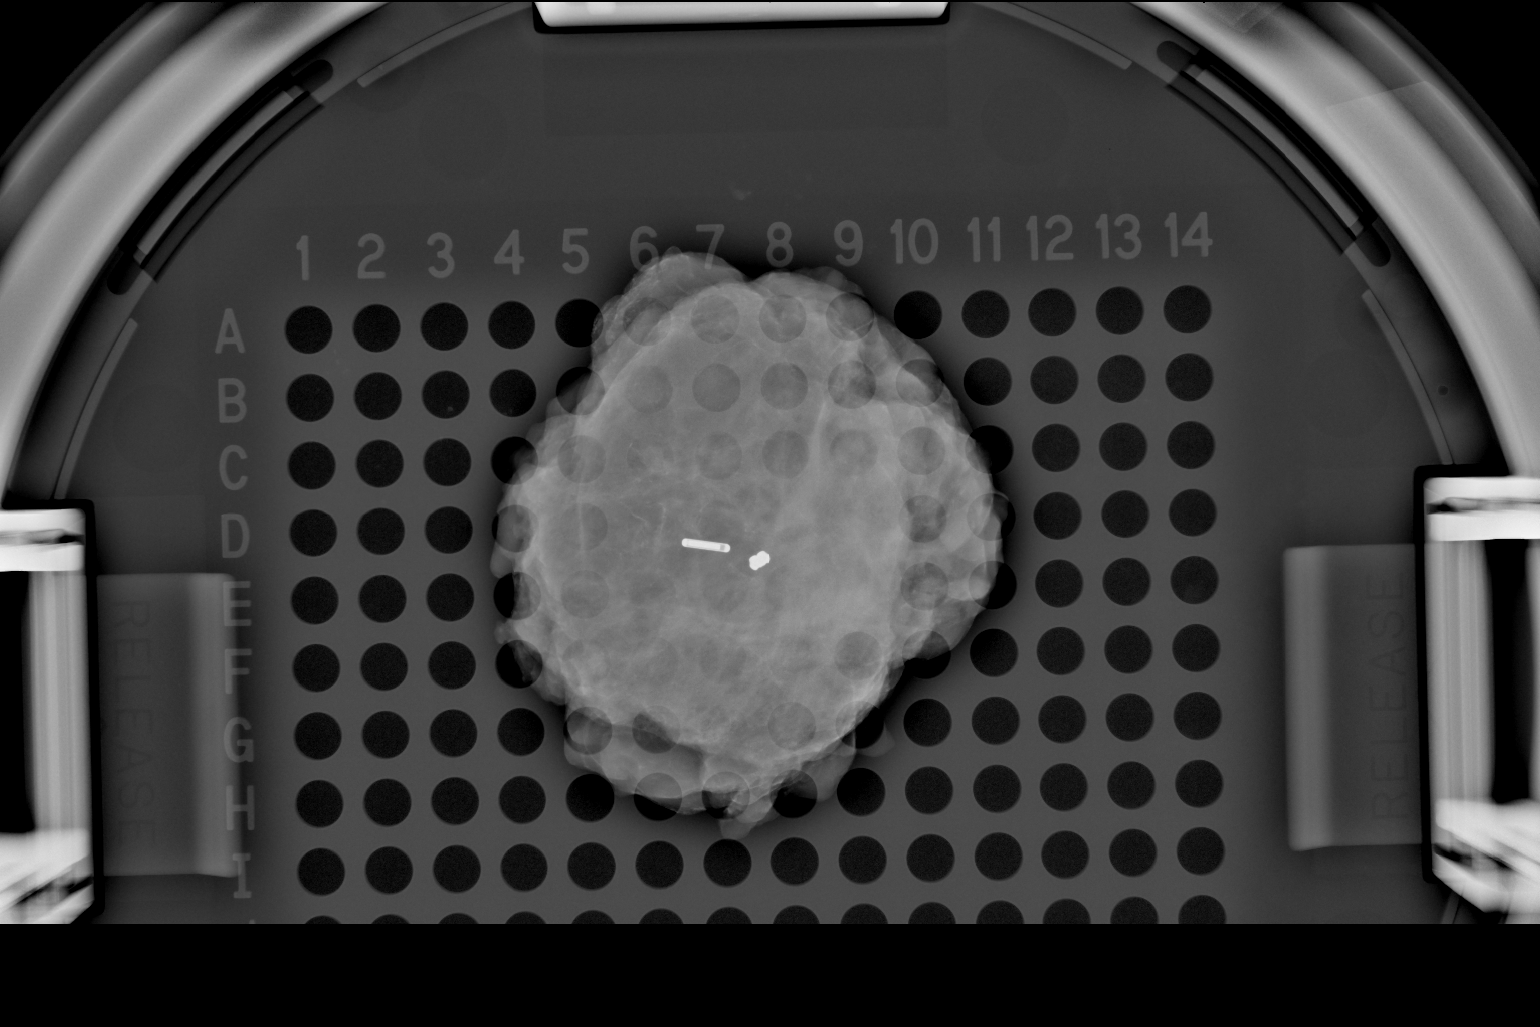

[1 of 1 positions shown; findings below may reference images not displayed]

FINDINGS: Status post excision of the right breast. The radioactive seed and
biopsy marker clip are present, completely intact, and were marked
for pathology. These findings were communicated with the OR at [DATE]
a.m..
IMPRESSION: Specimen radiograph of the right breast.

## 2019-09-30 ENCOUNTER — Other Ambulatory Visit: Payer: Self-pay | Admitting: *Deleted

## 2019-09-30 DIAGNOSIS — Z1231 Encounter for screening mammogram for malignant neoplasm of breast: Secondary | ICD-10-CM

## 2019-11-07 ENCOUNTER — Other Ambulatory Visit: Payer: Self-pay | Admitting: Obstetrics and Gynecology

## 2019-11-07 DIAGNOSIS — Z1231 Encounter for screening mammogram for malignant neoplasm of breast: Secondary | ICD-10-CM

## 2019-11-16 ENCOUNTER — Other Ambulatory Visit: Payer: Self-pay

## 2019-11-16 ENCOUNTER — Ambulatory Visit
Admission: RE | Admit: 2019-11-16 | Discharge: 2019-11-16 | Disposition: A | Discharge status: Home / Self Care | Payer: Managed Care, Other (non HMO) | Source: Ambulatory Visit | Attending: Obstetrics and Gynecology | Admitting: Obstetrics and Gynecology

## 2019-11-16 DIAGNOSIS — Z1231 Encounter for screening mammogram for malignant neoplasm of breast: Secondary | ICD-10-CM

## 2019-11-20 IMAGING — CT CT HEAD W/O CM
1 series · 16 of 30 positions shown, 20 images · non-contrast
Comparison: MR brain 12/07/2015.

CLINICAL DATA: Fell and hit back of head yesterday. Headache with
nausea and blurred vision. Neck stiffness. Syncopal episode.

EXAM:
CT HEAD WITHOUT CONTRAST
TECHNIQUE: Contiguous axial images were obtained from the base of the skull
through the vertex without intravenous contrast.

[Series 2: head w/(date) · axial · 0.47mm/px · z∈[-192,-37]mm · 16 of 35 slices shown, 20 images]
[im 2/35  brain]
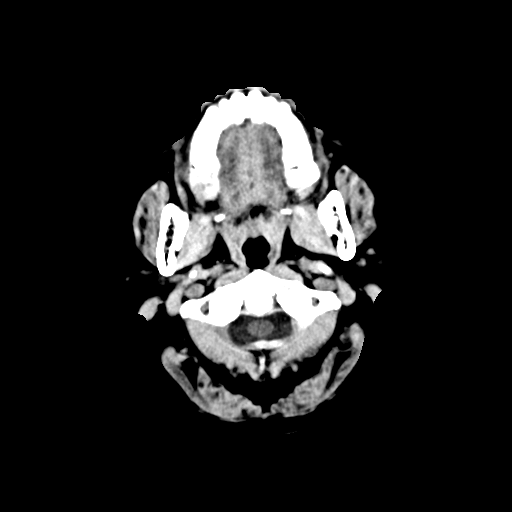
[im 2/35  bone]
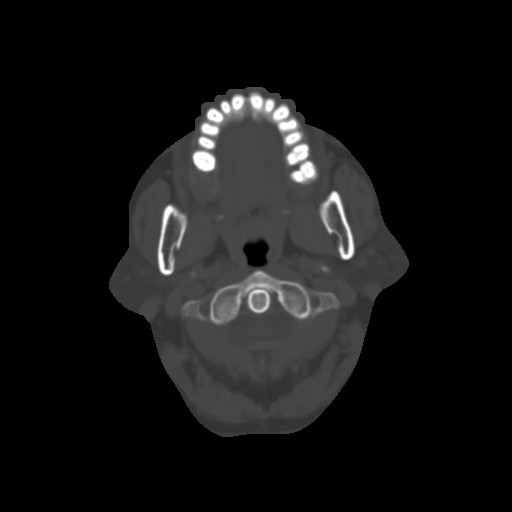
[im 4/35  brain]
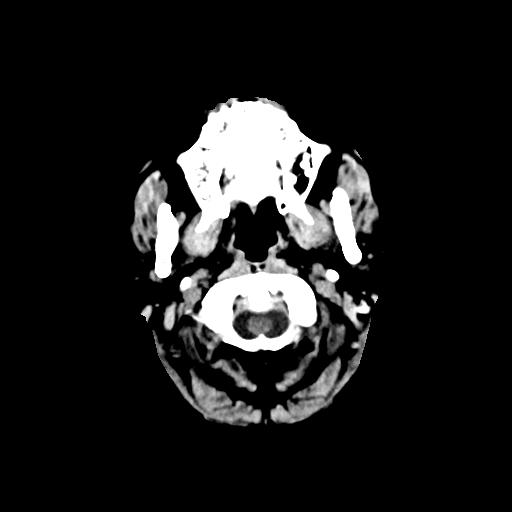
[im 6/35  brain]
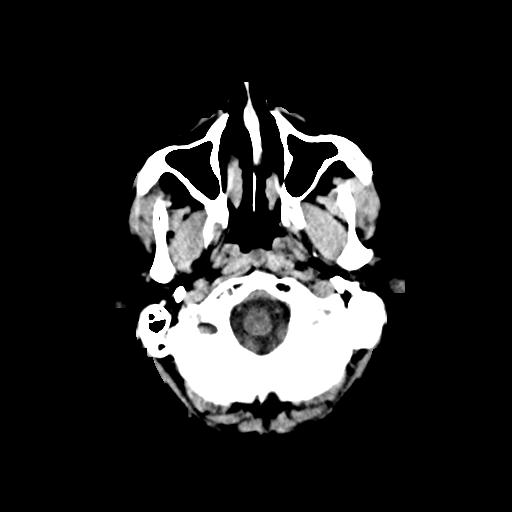
[im 9/35  brain]
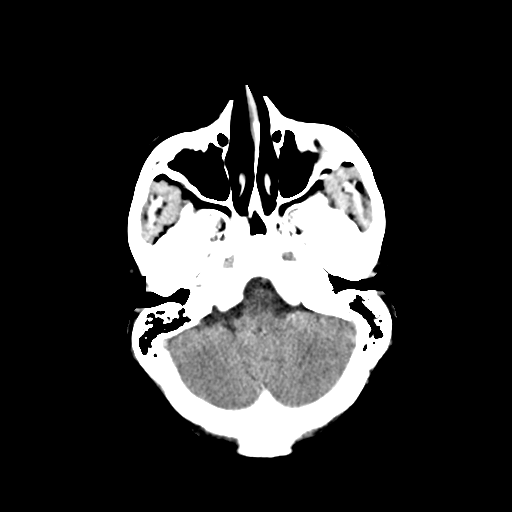
[im 10/35  brain]
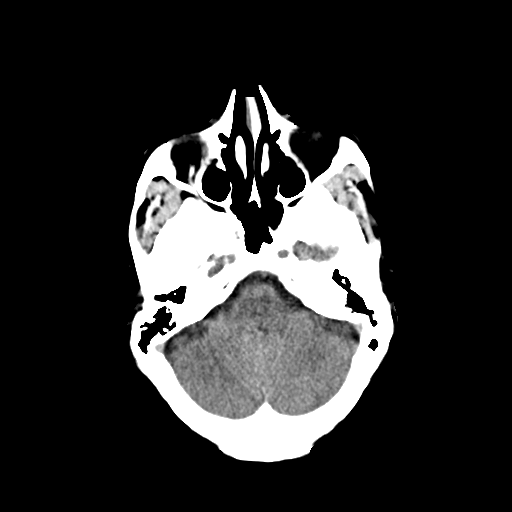
[im 10/35  bone]
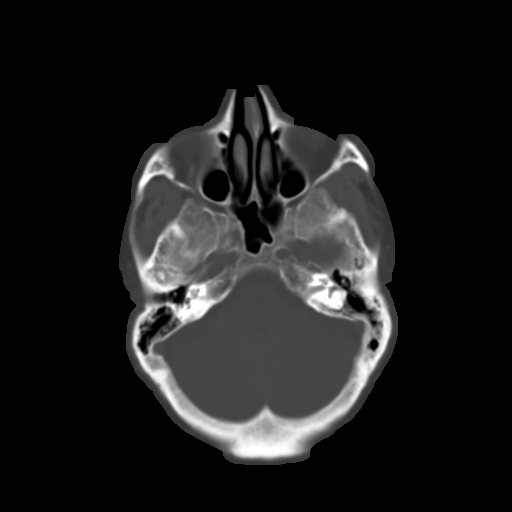
[im 12/35  brain]
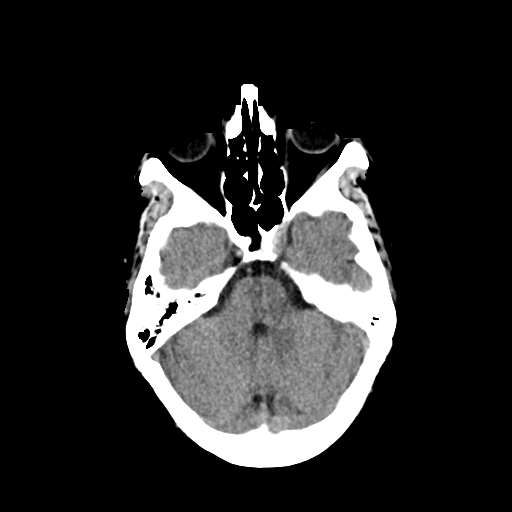
[im 15/35  brain]
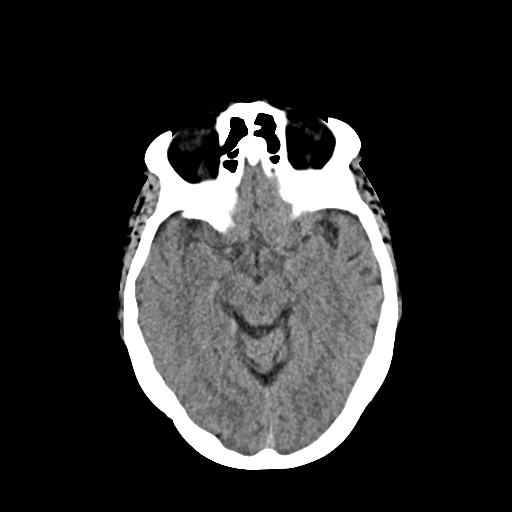
[im 17/35  brain]
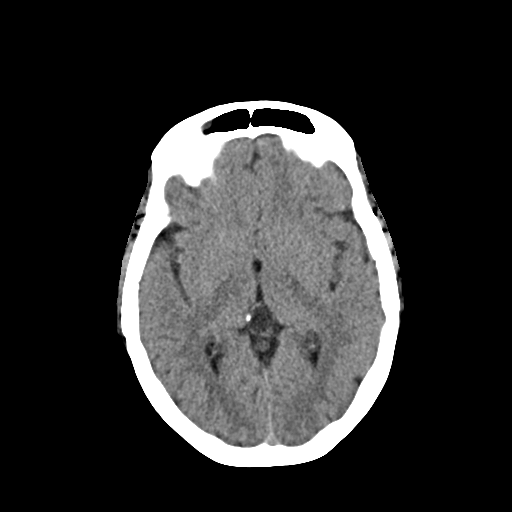
[im 18/35  brain]
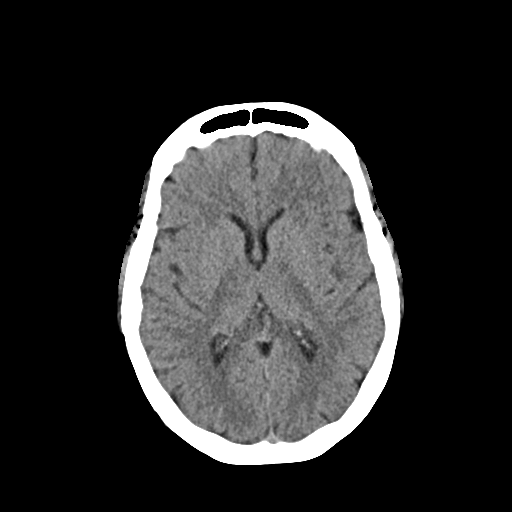
[im 18/35  bone]
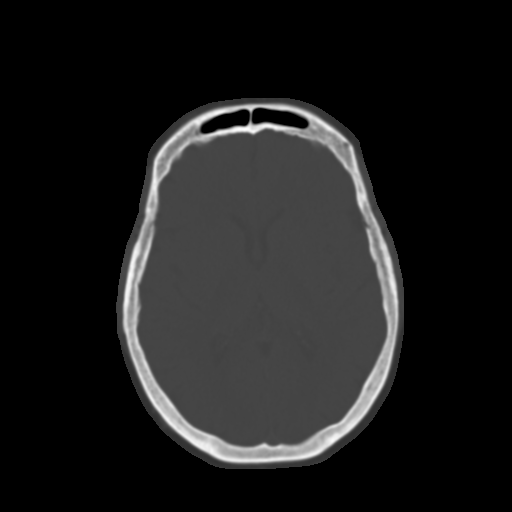
[im 20/35  brain]
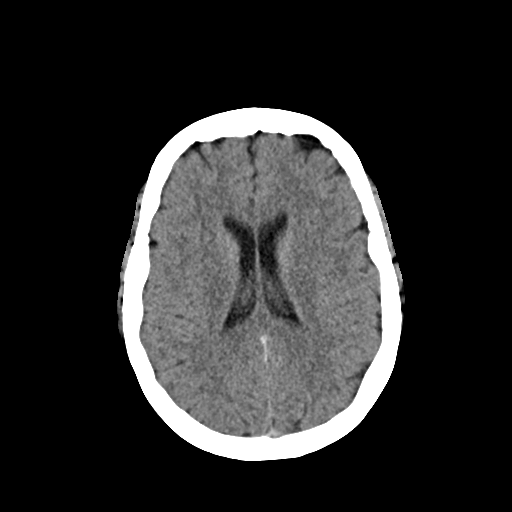
[im 23/35  brain]
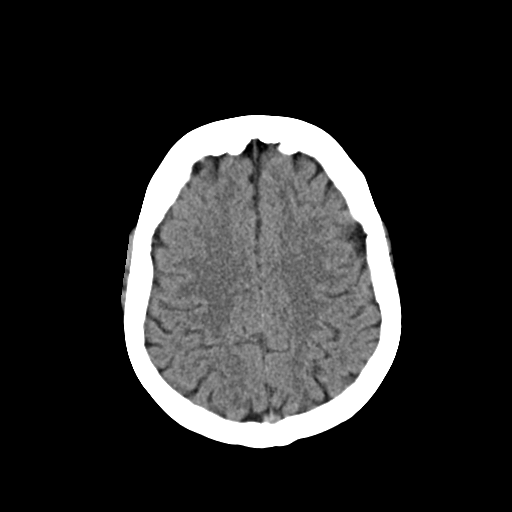
[im 25/35  brain]
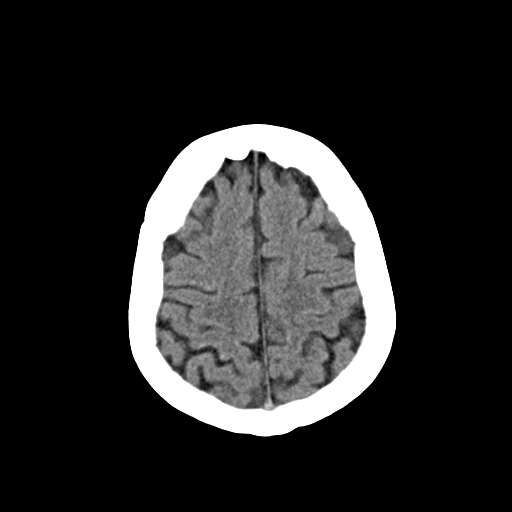
[im 26/35  brain]
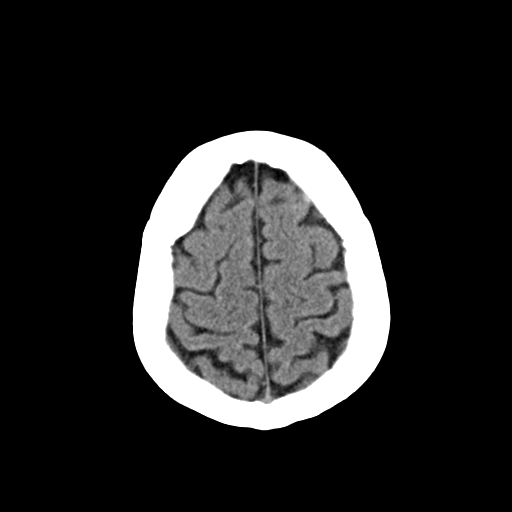
[im 26/35  bone]
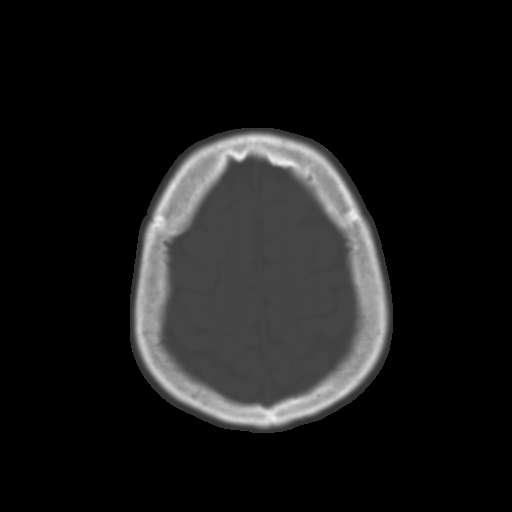
[im 29/35  brain]
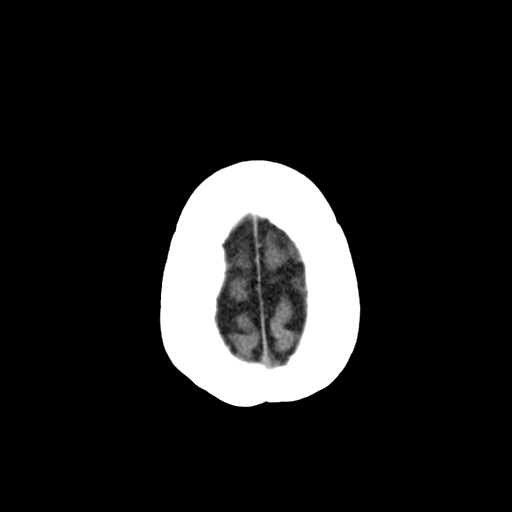
[im 31/35  brain]
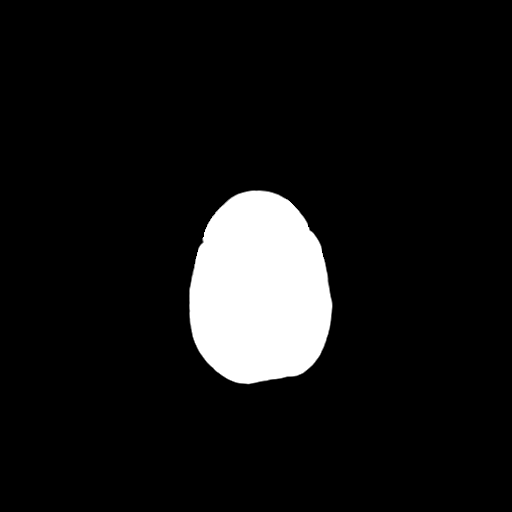
[im 33/35  brain]
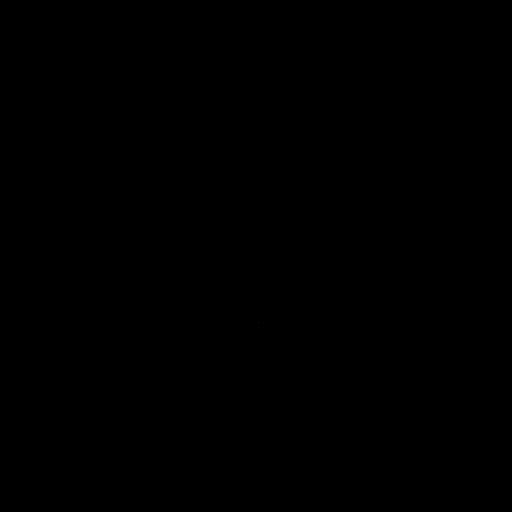

[16 of 30 positions shown; findings below may reference images not displayed]

FINDINGS: Brain: No evidence for acute infarction, hemorrhage, mass lesion,
hydrocephalus, or extra-axial fluid. Normal cerebral volume. No
significant white matter disease.

Stable pineal cyst, 10 x 13 mm cross-section, without worrisome
nodularity or regional mass effect. Partial empty sella, without
erosion of the sellar floor, also stable.

Vascular: No hyperdense vessel or unexpected calcification.

Skull: Normal. Negative for fracture or focal lesion.

Sinuses/Orbits: No acute finding.

Other: None.
IMPRESSION: No skull fracture or intracranial hemorrhage. Stable partial empty
sella.

Stable 10 x 13 mm pineal cyst. No acute intracranial findings.

## 2019-12-22 ENCOUNTER — Encounter: Payer: Self-pay | Admitting: Gastroenterology

## 2020-01-09 ENCOUNTER — Other Ambulatory Visit: Payer: Self-pay

## 2020-01-09 ENCOUNTER — Ambulatory Visit (INDEPENDENT_AMBULATORY_CARE_PROVIDER_SITE_OTHER): Payer: Managed Care, Other (non HMO)

## 2020-01-09 ENCOUNTER — Encounter: Payer: Self-pay | Admitting: Podiatry

## 2020-01-09 ENCOUNTER — Ambulatory Visit: Payer: PRIVATE HEALTH INSURANCE | Admitting: Podiatry

## 2020-01-09 DIAGNOSIS — W19XXXA Unspecified fall, initial encounter: Secondary | ICD-10-CM

## 2020-01-09 DIAGNOSIS — S99912A Unspecified injury of left ankle, initial encounter: Secondary | ICD-10-CM | POA: Diagnosis not present

## 2020-01-09 DIAGNOSIS — S93602A Unspecified sprain of left foot, initial encounter: Secondary | ICD-10-CM | POA: Diagnosis not present

## 2020-01-10 NOTE — Progress Notes (Signed)
  Subjective:  Patient ID: [name], female    DOB: [birth year],  MRN: [id number]  Chief Complaint  Patient presents with  . Foot Injury    L foot. Pt stated, "My foot twisted last night when I was standing up. My toes pointed down and my heel moved outward (laterally). Pain = 8/10. It's throbbing. I've been icing and elevating".    59 y.o. female presents with the above complaint. History confirmed with patient.  She stood up to walk across the living room at home, think she may have tripped her foot over a rug or something.  She had immediate edema and swelling and bruising.  Objective:  Physical Exam: warm, good capillary refill, no trophic changes or ulcerative lesions, normal DP and PT pulses and normal sensory exam. Left Foot: Severe brawny edema over the dorsal forefoot, her sensation is intact and palpable pulses are present, no paresthesias she has pain over the first second and third metatarsals with no instability.  Unclear if she has pain in the Lisfranc joint or if this is referred from the lateral.  No ecchymosis noted.  Radiographs: X-ray of the left foot: no fracture, dislocation, or degenerative changes noted Assessment:   1. Sprain of left foot, initial encounter   2. Fall from standing, initial encounter      Plan:  Patient was evaluated and treated and all questions answered.   -She appears to have a sprain of the tarsometatarsal joint without associated fracture or widening of the Lisfranc interval -CAM boot was dispensed, she may be weightbearing as tolerated in this -Reviewed RICE protocol in detail with her and applied an Ace wrap today -She works at [employer] and is on her feet all day.  She would like to be out of work for the rest of the week and I think this is reasonable considering the severity of the injury. -If she is still symptomatic at next visit we will take updated weightbearing foot x-rays  Return in about 1 month (around  02/09/2020).

## 2020-01-20 ENCOUNTER — Ambulatory Visit: Payer: Managed Care, Other (non HMO) | Admitting: Podiatry

## 2020-02-02 ENCOUNTER — Emergency Department (HOSPITAL_COMMUNITY): Payer: Managed Care, Other (non HMO)

## 2020-02-02 ENCOUNTER — Encounter (HOSPITAL_COMMUNITY): Payer: Self-pay | Admitting: Emergency Medicine

## 2020-02-02 ENCOUNTER — Emergency Department (HOSPITAL_COMMUNITY)
Admission: EM | Admit: 2020-02-02 | Discharge: 2020-02-03 | Disposition: A | Discharge status: Home / Self Care | Payer: Managed Care, Other (non HMO) | Attending: Emergency Medicine | Admitting: Emergency Medicine

## 2020-02-02 ENCOUNTER — Ambulatory Visit: Payer: Self-pay

## 2020-02-02 ENCOUNTER — Telehealth: Payer: Self-pay | Admitting: Oncology

## 2020-02-02 DIAGNOSIS — F17210 Nicotine dependence, cigarettes, uncomplicated: Secondary | ICD-10-CM | POA: Diagnosis not present

## 2020-02-02 DIAGNOSIS — Z79899 Other long term (current) drug therapy: Secondary | ICD-10-CM | POA: Diagnosis not present

## 2020-02-02 DIAGNOSIS — U071 COVID-19: Secondary | ICD-10-CM | POA: Insufficient documentation

## 2020-02-02 DIAGNOSIS — R531 Weakness: Secondary | ICD-10-CM | POA: Diagnosis not present

## 2020-02-02 DIAGNOSIS — R000 Tachycardia, unspecified: Secondary | ICD-10-CM | POA: Diagnosis not present

## 2020-02-02 DIAGNOSIS — Z96641 Presence of right artificial hip joint: Secondary | ICD-10-CM | POA: Insufficient documentation

## 2020-02-02 DIAGNOSIS — I10 Essential (primary) hypertension: Secondary | ICD-10-CM | POA: Insufficient documentation

## 2020-02-02 DIAGNOSIS — R0600 Dyspnea, unspecified: Secondary | ICD-10-CM | POA: Diagnosis present

## 2020-02-02 LAB — COMPREHENSIVE METABOLIC PANEL
ALT: 24 U/L (ref 0–44)
AST: 36 U/L (ref 15–41)
Albumin: 3.6 g/dL (ref 3.5–5.0)
Alkaline Phosphatase: 85 U/L (ref 38–126)
Anion gap: 11 (ref 5–15)
BUN: 14 mg/dL (ref 6–20)
CO2: 24 mmol/L (ref 22–32)
Calcium: 8.4 mg/dL — ABNORMAL LOW (ref 8.9–10.3)
Chloride: 104 mmol/L (ref 98–111)
Creatinine, Ser: 0.77 mg/dL (ref 0.44–1.00)
GFR calc Af Amer: >60 mL/min (ref >60)
GFR calc non Af Amer: >60 mL/min (ref >60)
Glucose, Bld: 96 mg/dL (ref 70–99)
Potassium: 3.4 mmol/L — ABNORMAL LOW (ref 3.5–5.1)
Sodium: 139 mmol/L (ref 135–145)
Total Bilirubin: 0.6 mg/dL (ref 0.3–1.2)
Total Protein: 6.5 g/dL (ref 6.5–8.1)

## 2020-02-02 LAB — CBC
HCT: 45 % (ref 36.0–46.0)
Hemoglobin: 14.3 g/dL (ref 12.0–15.0)
MCH: 28.8 pg (ref 26.0–34.0)
MCHC: 31.8 g/dL (ref 30.0–36.0)
MCV: 90.7 fL (ref 80.0–100.0)
Platelets: 153 10*3/uL (ref 150–400)
RBC: 4.96 MIL/uL (ref 3.87–5.11)
RDW: 13.2 % (ref 11.5–15.5)
WBC: 3.4 10*3/uL — ABNORMAL LOW (ref 4.0–10.5)
nRBC: 0 % (ref 0.0–0.2)

## 2020-02-02 MED ORDER — AMOXICILLIN-POT CLAVULANATE 875-125 MG PO TABS
1.0000 | ORAL_TABLET | Freq: Once | ORAL | Status: AC
  Administered 2020-02-02: 1 via ORAL
  Filled 2020-02-02: qty 1

## 2020-02-02 MED ORDER — IBUPROFEN 800 MG PO TABS
800.0000 mg | ORAL_TABLET | Freq: Once | ORAL | Status: AC
  Administered 2020-02-02: 800 mg via ORAL

## 2020-02-02 MED ORDER — AZITHROMYCIN 250 MG PO TABS: 250.0000 mg | ORAL_TABLET | Freq: Every day | ORAL | 0 refills | Status: AC

## 2020-02-02 MED ORDER — AMOXICILLIN-POT CLAVULANATE 875-125 MG PO TABS: 1.0000 | ORAL_TABLET | Freq: Two times a day (BID) | ORAL | 0 refills | Status: DC

## 2020-02-02 MED ORDER — ALBUTEROL SULFATE HFA 108 (90 BASE) MCG/ACT IN AERS: 2.0000 | INHALATION_SPRAY | Freq: Once | RESPIRATORY_TRACT | Status: DC | PRN

## 2020-02-02 MED ORDER — SODIUM CHLORIDE 0.9 % IV SOLN
1200.0000 mg | Freq: Once | INTRAVENOUS | Status: AC
  Administered 2020-02-02: 1200 mg via INTRAVENOUS
  Filled 2020-02-02 (×2): qty 10

## 2020-02-02 MED ORDER — SODIUM CHLORIDE 0.9 % IV SOLN
500.0000 mg | Freq: Once | INTRAVENOUS | Status: AC
  Administered 2020-02-02: 500 mg via INTRAVENOUS
  Filled 2020-02-02: qty 500

## 2020-02-02 MED ORDER — FAMOTIDINE IN NACL 20-0.9 MG/50ML-% IV SOLN: 20.0000 mg | Freq: Once | INTRAVENOUS | Status: DC | PRN

## 2020-02-02 MED ORDER — DIPHENHYDRAMINE HCL 50 MG/ML IJ SOLN: 50.0000 mg | Freq: Once | INTRAMUSCULAR | Status: DC | PRN

## 2020-02-02 MED ORDER — KETOROLAC TROMETHAMINE 30 MG/ML IJ SOLN
15.0000 mg | Freq: Once | INTRAMUSCULAR | Status: AC
  Administered 2020-02-02: 15 mg via INTRAVENOUS
  Filled 2020-02-02: qty 1

## 2020-02-02 MED ORDER — METHYLPREDNISOLONE SODIUM SUCC 125 MG IJ SOLR: 125.0000 mg | Freq: Once | INTRAMUSCULAR | Status: DC | PRN

## 2020-02-02 MED ORDER — SODIUM CHLORIDE 0.9 % IV BOLUS
1000.0000 mL | Freq: Once | INTRAVENOUS | Status: AC
  Administered 2020-02-02: 1000 mL via INTRAVENOUS

## 2020-02-02 MED ORDER — ALBUTEROL SULFATE HFA 108 (90 BASE) MCG/ACT IN AERS
2.0000 | INHALATION_SPRAY | Freq: Four times a day (QID) | RESPIRATORY_TRACT | Status: DC
  Filled 2020-02-02: qty 6.7

## 2020-02-02 MED ORDER — EPINEPHRINE 0.3 MG/0.3ML IJ SOAJ: 0.3000 mg | Freq: Once | INTRAMUSCULAR | Status: DC | PRN

## 2020-02-02 MED ORDER — SODIUM CHLORIDE 0.9 % IV SOLN: INTRAVENOUS | Status: DC | PRN

## 2020-02-02 NOTE — ED Notes (Signed)
Per pharmacy monitor at least 1hr after completed infusion of regen-cov.

## 2020-02-02 NOTE — Telephone Encounter (Signed)
Re: Mab infusion  Called patient to screen for Mab infusion.  It appears that [name] is currently in the emergency room.  High risk criteria met for Mab infusion- BMI > 25  Past Medical History:  Diagnosis Date  . Anxiety   . Depression   . Fatty liver   . History of kidney stones   . Hyperlipidemia   . Hyperparathyroidism ([place])   . Hypertension   . Nephrolithiasis   . Obesity   . OSA (obstructive sleep apnea) 03/09/2013   denies; does not use CPAP   . Pre-diabetes    last A1C in 2015 5.9% see 01-13-14 lab epic   . Sleep apnea   . Vitamin D deficiency   . Wound, breast    superficial appearing , reddened area to left upper breast; per patient, has been in sun recently , denies pain or drainage , "just itchy"     If patient does not receive Mab while in the emergency room today patient would qualify for an infusion as long as his onset of symptoms is within the past 10 days.   Faythe Casa, NP 02/02/2020 12:50 PM

## 2020-02-02 NOTE — ED Provider Notes (Signed)
[facility] EMERGENCY DEPARTMENT Provider Note   CSN: [id number] Arrival date & time: 02/02/20  1034     History No chief complaint on file.   [name] is a 59 y.o. female.  HPI Patient presents with dyspnea, fatigue, weakness, anorexia. Patient has a history of hypertension, and cigarette addiction. Patient received her first coronavirus vaccine 15 days ago. 7 days ago the patient began feeling ill, with cough, dyspnea, fatigue. She was diagnosed +4 days ago after testing 6 days ago. She has been using Tylenol, without substantial change in her condition. Now, she notes that over the past 2 days she has had substantial decline in functionality secondary to weakness, fatigue. She is, however, smoking cigarettes less frequently.   On chart review the patient was scheduling [facility] as an outpatient.   Past Medical History:  Diagnosis Date  . Anxiety   . Depression   . Fatty liver   . History of kidney stones   . Hyperlipidemia   . Hyperparathyroidism ([place])   . Hypertension   . Nephrolithiasis   . Obesity   . OSA (obstructive sleep apnea) 03/09/2013   denies; does not use CPAP   . Pre-diabetes    last A1C in 2015 5.9% see 01-13-14 lab epic   . Sleep apnea   . Vitamin D deficiency   . Wound, breast    superficial appearing , reddened area to left upper breast; per patient, has been in sun recently , denies pain or drainage , "just itchy"     Patient Active Problem List   Diagnosis Date Noted  . Pyelonephritis   . UTI (urinary tract infection) 01/10/2019  . HTN (hypertension) 01/10/2019  . GERD (gastroesophageal reflux disease) 01/10/2019  . Anxiety 01/10/2019  . Tobacco use 01/10/2019  . Primary osteoarthritis of right hip 02/27/2017  . Morbid obesity ([place]) 02/20/2015  . Chest pain, unspecified 01/13/2014  . Chest pain 01/13/2014  . Abnormal EKG- inferior TWI 01/13/2014  . OSA (obstructive sleep apnea) 03/09/2013    Past Surgical  History:  Procedure Laterality Date  . BACK SURGERY    . BREAST LUMPECTOMY WITH RADIOACTIVE SEED LOCALIZATION Right 02/05/2018   Procedure: BREAST LUMPECTOMY WITH RADIOACTIVE SEED LOCALIZATION;  Surgeon: Alphonsa Overall, MD;  Location: [facility];  Service: General;  Laterality: Right;  . BREATH TEK H PYLORI N/A 08/31/2014   Procedure: BREATH TEK H PYLORI;  Surgeon: Alphonsa Overall, MD;  Location: [facility];  Service: General;  Laterality: N/A;  . LAPAROSCOPIC GASTRIC SLEEVE RESECTION N/A 02/20/2015   Procedure: LAPAROSCOPIC GASTRIC SLEEVE RESECTION;  Surgeon: Alphonsa Overall, MD;  Location: [facility];  Service: General;  Laterality: N/A;  . PARTIAL HYSTERECTOMY    . sleep study  04/08/13  . THYROID SURGERY    . TOTAL HIP ARTHROPLASTY Right 02/27/2017   Procedure: RIGHT TOTAL HIP ARTHROPLASTY ANTERIOR APPROACH;  Surgeon: Dorna Leitz, MD;  Location: [facility];  Service: Orthopedics;  Laterality: Right;  Panel Lenght: 120 mins     OB History   No obstetric history on file.     Family History  Problem Relation Age of Onset  . COPD Mother   . Hypertension Mother   . Heart disease Mother   . Heart attack Mother   . Heart attack Father        died age 62 with the heart attack  . Heart disease Father   . Hypertension Sister   . Hypertension Brother   . Hypertension Sister   .  Other Son        died age 32, autopsy pending possible aneurysm  . Colon cancer Neg Hx   . Breast cancer Neg Hx     Social History   Tobacco Use  . Smoking status: Current Every Day Smoker    Packs/day: 0.25    Years: 20.00    Pack years: 5.00    Types: Cigarettes  . Smokeless tobacco: Never Used  Vaping Use  . Vaping Use: Never used  Substance Use Topics  . Alcohol use: No    Alcohol/week: 0.0 standard drinks  . Drug use: No    Home Medications Prior to Admission medications   Medication Sig Start Date End Date Taking? Authorizing Provider  amLODipine (NORVASC) 5 MG tablet Take 5 mg by mouth daily.     [provider]  amoxicillin-clavulanate (AUGMENTIN) 875-125 MG tablet Take 1 tablet by mouth every 12 (twelve) hours. 02/02/20   Carmin Muskrat, MD  azithromycin (ZITHROMAX) 250 MG tablet Take 1 tablet (250 mg total) by mouth daily for 4 days. Take 1 every day until finished. 02/02/20 02/06/20  Carmin Muskrat, MD  Cholecalciferol (VITAMIN D-3) 5000 UNITS TABS Take 5,000 Units by mouth every morning.     [provider]  venlafaxine XR (EFFEXOR-XR) 75 MG 24 hr capsule Take 75 mg by mouth daily. 12/11/16   [provider]    Allergies    Cortisone, Prednisone, and Hydrocodone  Review of Systems   Review of Systems  Constitutional:       Per HPI, otherwise negative  HENT:       Per HPI, otherwise negative  Respiratory:       Per HPI, otherwise negative  Cardiovascular:       Per HPI, otherwise negative  Gastrointestinal: Negative for vomiting.  Endocrine:       Negative aside from HPI  Genitourinary:       Neg aside from HPI   Musculoskeletal:       Per HPI, otherwise negative  Skin: Negative.   Allergic/Immunologic: Negative for immunocompromised state.  Neurological: Positive for weakness. Negative for syncope.    Physical Exam Updated Vital Signs BP 119/67   Pulse 62   Temp 99.1 F (37.3 C) (Oral)   Resp (!) 28   Ht 5\' 3"  (1.6 m)   Wt 92.1 kg   SpO2 96%   BMI 35.96 kg/m   Physical Exam Vitals and nursing note reviewed.  Constitutional:      General: She is not in acute distress.    Appearance: She is well-developed. She is obese. She is ill-appearing.  HENT:     Head: Normocephalic and atraumatic.  Eyes:     Conjunctiva/sclera: Conjunctivae normal.  Cardiovascular:     Rate and Rhythm: Regular rhythm. Tachycardia present.  Pulmonary:     Breath sounds: Normal breath sounds. No stridor.  Abdominal:     General: There is no distension.  Musculoskeletal:        General: No deformity.  Skin:    General: Skin is warm and dry.   Neurological:     Mental Status: She is alert and oriented to person, place, and time.     Cranial Nerves: No cranial nerve deficit.  Psychiatric:        Mood and Affect: Mood normal.     ED Results / Procedures / Treatments   Labs (all labs ordered are listed, but only abnormal results are displayed) Labs Reviewed  CBC - Abnormal;  Notable for the following components:      Result Value   WBC 3.4 (*)    All other components within normal limits  COMPREHENSIVE METABOLIC PANEL - Abnormal; Notable for the following components:   Potassium 3.4 (*)    Calcium 8.4 (*)    All other components within normal limits    Radiology DG Chest Portable 1 View  Result Date: 02/02/2020 CLINICAL DATA:  Productive cough. COVID positive. EXAM: PORTABLE CHEST 1 VIEW COMPARISON:  12/31/2016 FINDINGS: The cardiac silhouette, mediastinal and hilar contours are within normal limits and stable. Mild tortuosity of the thoracic aorta. Patchy left basilar infiltrate. No pleural effusions. No pulmonary edema. No pulmonary lesions. The bony thorax is intact. IMPRESSION: Patchy left basilar infiltrate. Electronically Signed   By: Marijo Sanes M.D.   On: 02/02/2020 11:43    Procedures Procedures (including critical care time)  Medications Ordered in ED Medications  0.9 %  sodium chloride infusion (has no administration in time range)  diphenhydrAMINE (BENADRYL) injection 50 mg (has no administration in time range)  famotidine (PEPCID) IVPB 20 mg premix (has no administration in time range)  methylPREDNISolone sodium succinate (SOLU-MEDROL) 125 mg/2 mL injection 125 mg (has no administration in time range)  albuterol (VENTOLIN HFA) 108 (90 Base) MCG/ACT inhaler 2 puff (has no administration in time range)  EPINEPHrine (EPI-PEN) injection 0.3 mg (has no administration in time range)  albuterol (VENTOLIN HFA) 108 (90 Base) MCG/ACT inhaler 2 puff (has no administration in time range)  ibuprofen (ADVIL) tablet  800 mg (800 mg Oral Given 02/02/20 1521)  casirivimab-imdevimab (REGEN-COV) 1,200 mg in sodium chloride 0.9 % 110 mL IVPB (0 mg Intravenous Stopped 02/02/20 2212)  azithromycin (ZITHROMAX) 500 mg in sodium chloride 0.9 % 250 mL IVPB (0 mg Intravenous Stopped 02/02/20 2132)  amoxicillin-clavulanate (AUGMENTIN) 875-125 MG per tablet 1 tablet (1 tablet Oral Given 02/02/20 1955)  sodium chloride 0.9 % bolus 1,000 mL (1,000 mLs Intravenous New Bag/Given 02/02/20 2008)  ketorolac (TORADOL) 30 MG/ML injection 15 mg (15 mg Intravenous Given 02/02/20 1956)    ED Course  I have reviewed the triage vital signs and the nursing notes.  Pertinent labs & imaging results that were available during my care of the patient were reviewed by me and considered in my medical decision making (see chart for details).  Patient is not hypoxic, though she does have mild tachycardia, tachypnea on initial exam. With a days of symptoms, and prior attempts to enroll in monoclonal antibody infusion, she will receive this therapy here, pending additional results   11:32 PM Patient has completed monoclonal antibody therapy, IV antibiotics, azithromycin, Toradol, fluids, is awake, alert, no hemodynamic instability. Have again reviewed today's evaluation, including concern for ongoing coronavirus, though absent new oxygen requirement, the patient is appropriate to return home, with outpatient follow-up as needed. Given the duration of her symptoms, opacification on x-ray, she will continue her antibiotics in addition.  Final Clinical Impression(s) / ED Diagnoses Final diagnoses:  COVID-19 virus infection    Rx / DC Orders ED Discharge Orders         Ordered    azithromycin (ZITHROMAX) 250 MG tablet  Daily        02/02/20 2330    amoxicillin-clavulanate (AUGMENTIN) 875-125 MG tablet  Every 12 hours        02/02/20 2330           Carmin Muskrat, MD 02/02/20 2333

## 2020-02-02 NOTE — ED Triage Notes (Signed)
Pt here with known covid day 8 along with her husband , states that she has comtiuned sob and cough and fever pt took tylenol for a fever of 103 this morning

## 2020-02-02 NOTE — Discharge Instructions (Signed)
As discussed, recovery from coronavirus can take several weeks. Please stay well rested, drink plenty fluids, and do not hesitate to return here for concerning changes in your condition.

## 2020-02-03 MED ORDER — ACETAMINOPHEN 325 MG PO TABS
650.0000 mg | ORAL_TABLET | Freq: Once | ORAL | Status: AC
  Administered 2020-02-03: 650 mg via ORAL

## 2020-02-03 NOTE — ED Notes (Signed)
Pt verbalized understanding of d/c instruction, follow up and when to return. Pt now in her husband room in the ED who is also Covid +.  Pt return demonstrated how to use inhaler.

## 2020-02-07 ENCOUNTER — Ambulatory Visit: Payer: Managed Care, Other (non HMO) | Admitting: Gastroenterology

## 2020-02-09 ENCOUNTER — Ambulatory Visit: Payer: Managed Care, Other (non HMO) | Admitting: Podiatry

## 2020-03-21 ENCOUNTER — Ambulatory Visit (HOSPITAL_COMMUNITY): Payer: Managed Care, Other (non HMO) | Admitting: Anesthesiology

## 2020-03-21 ENCOUNTER — Encounter (HOSPITAL_COMMUNITY): Payer: Self-pay | Admitting: Urology

## 2020-03-21 ENCOUNTER — Other Ambulatory Visit: Payer: Self-pay | Admitting: Urology

## 2020-03-21 ENCOUNTER — Ambulatory Visit (HOSPITAL_COMMUNITY)
Admission: AD | Admit: 2020-03-21 | Discharge: 2020-03-21 | Disposition: A | Discharge status: Home / Self Care | Payer: Managed Care, Other (non HMO) | Source: Ambulatory Visit | Attending: Urology | Admitting: Urology

## 2020-03-21 ENCOUNTER — Ambulatory Visit (HOSPITAL_COMMUNITY): Payer: Managed Care, Other (non HMO)

## 2020-03-21 ENCOUNTER — Ambulatory Visit
Admission: RE | Admit: 2020-03-21 | Discharge: 2020-03-21 | Disposition: A | Discharge status: Home / Self Care | Payer: Managed Care, Other (non HMO) | Source: Ambulatory Visit | Attending: Urology | Admitting: Urology

## 2020-03-21 ENCOUNTER — Other Ambulatory Visit: Payer: Self-pay

## 2020-03-21 ENCOUNTER — Encounter (HOSPITAL_COMMUNITY)
Admission: AD | Disposition: A | Discharge status: Home / Self Care | Payer: Self-pay | Source: Ambulatory Visit | Attending: Urology

## 2020-03-21 DIAGNOSIS — Z87891 Personal history of nicotine dependence: Secondary | ICD-10-CM | POA: Insufficient documentation

## 2020-03-21 DIAGNOSIS — N23 Unspecified renal colic: Secondary | ICD-10-CM

## 2020-03-21 DIAGNOSIS — N390 Urinary tract infection, site not specified: Secondary | ICD-10-CM | POA: Diagnosis not present

## 2020-03-21 DIAGNOSIS — N201 Calculus of ureter: Secondary | ICD-10-CM | POA: Diagnosis not present

## 2020-03-21 DIAGNOSIS — Z8616 Personal history of COVID-19: Secondary | ICD-10-CM | POA: Insufficient documentation

## 2020-03-21 DIAGNOSIS — Z880 Allergy status to penicillin: Secondary | ICD-10-CM | POA: Diagnosis not present

## 2020-03-21 HISTORY — PX: CYSTOSCOPY WITH STENT PLACEMENT: SHX5790

## 2020-03-21 SURGERY — CYSTOSCOPY, WITH STENT INSERTION
Anesthesia: General | Laterality: Right

## 2020-03-21 MED ORDER — CHLORHEXIDINE GLUCONATE 0.12 % MT SOLN
15.0000 mL | OROMUCOSAL | Status: AC
  Administered 2020-03-21: 15 mL via OROMUCOSAL

## 2020-03-21 MED ORDER — LIDOCAINE 2% (20 MG/ML) 5 ML SYRINGE
INTRAMUSCULAR | Status: AC
  Filled 2020-03-21: qty 5

## 2020-03-21 MED ORDER — SODIUM CHLORIDE 0.9 % IV SOLN
2.0000 g | Freq: Once | INTRAVENOUS | Status: AC
  Administered 2020-03-21: 2 g via INTRAVENOUS
  Filled 2020-03-21: qty 20

## 2020-03-21 MED ORDER — EPHEDRINE 5 MG/ML INJ
INTRAVENOUS | Status: AC
  Filled 2020-03-21: qty 10

## 2020-03-21 MED ORDER — AMISULPRIDE (ANTIEMETIC) 5 MG/2ML IV SOLN: 10.0000 mg | Freq: Once | INTRAVENOUS | Status: DC | PRN

## 2020-03-21 MED ORDER — LACTATED RINGERS IV SOLN: Freq: Once | INTRAVENOUS | Status: AC

## 2020-03-21 MED ORDER — KETOROLAC TROMETHAMINE 30 MG/ML IJ SOLN
INTRAMUSCULAR | Status: AC
  Administered 2020-03-21: 30 mg via INTRAVENOUS
  Filled 2020-03-21: qty 1

## 2020-03-21 MED ORDER — OXYCODONE HCL 5 MG/5ML PO SOLN: 5.0000 mg | Freq: Once | ORAL | Status: DC | PRN

## 2020-03-21 MED ORDER — ONDANSETRON HCL 4 MG/2ML IJ SOLN
INTRAMUSCULAR | Status: DC | PRN
  Administered 2020-03-21: 4 mg via INTRAVENOUS

## 2020-03-21 MED ORDER — SODIUM CHLORIDE (PF) 0.9 % IJ SOLN
INTRAMUSCULAR | Status: AC
  Filled 2020-03-21: qty 10

## 2020-03-21 MED ORDER — LACTATED RINGERS IV SOLN: INTRAVENOUS | Status: DC | PRN

## 2020-03-21 MED ORDER — SODIUM CHLORIDE 0.9 % IR SOLN
Status: DC | PRN
  Administered 2020-03-21: 3000 mL

## 2020-03-21 MED ORDER — ONDANSETRON HCL 4 MG PO TABS: 4.0000 mg | ORAL_TABLET | Freq: Three times a day (TID) | ORAL | 0 refills | Status: DC | PRN

## 2020-03-21 MED ORDER — ONDANSETRON HCL 4 MG/2ML IJ SOLN
INTRAMUSCULAR | Status: AC
  Filled 2020-03-21: qty 2

## 2020-03-21 MED ORDER — KETOROLAC TROMETHAMINE 30 MG/ML IJ SOLN: 30.0000 mg | Freq: Once | INTRAMUSCULAR | Status: AC

## 2020-03-21 MED ORDER — HYDROCODONE-ACETAMINOPHEN 5-325 MG PO TABS
1.0000 | ORAL_TABLET | Freq: Four times a day (QID) | ORAL | 0 refills | Status: DC | PRN
Start: 2020-03-21 — End: 2020-04-10

## 2020-03-21 MED ORDER — FENTANYL CITRATE (PF) 100 MCG/2ML IJ SOLN
INTRAMUSCULAR | Status: AC
  Filled 2020-03-21: qty 2

## 2020-03-21 MED ORDER — LIDOCAINE 2% (20 MG/ML) 5 ML SYRINGE
INTRAMUSCULAR | Status: DC | PRN
  Administered 2020-03-21: 100 mg via INTRAVENOUS

## 2020-03-21 MED ORDER — MIDAZOLAM HCL 2 MG/2ML IJ SOLN
INTRAMUSCULAR | Status: AC
  Filled 2020-03-21: qty 2

## 2020-03-21 MED ORDER — ONDANSETRON HCL 4 MG/2ML IJ SOLN: 4.0000 mg | Freq: Once | INTRAMUSCULAR | Status: DC | PRN

## 2020-03-21 MED ORDER — DEXAMETHASONE SODIUM PHOSPHATE 10 MG/ML IJ SOLN
INTRAMUSCULAR | Status: AC
  Filled 2020-03-21: qty 1

## 2020-03-21 MED ORDER — DIPHENHYDRAMINE HCL 50 MG/ML IJ SOLN
INTRAMUSCULAR | Status: DC | PRN
  Administered 2020-03-21: 12.5 mg via INTRAVENOUS

## 2020-03-21 MED ORDER — SULFAMETHOXAZOLE-TRIMETHOPRIM 800-160 MG PO TABS: 1.0000 | ORAL_TABLET | Freq: Two times a day (BID) | ORAL | 0 refills | Status: DC

## 2020-03-21 MED ORDER — ONDANSETRON HCL 4 MG/2ML IJ SOLN
4.0000 mg | Freq: Once | INTRAMUSCULAR | Status: AC
  Administered 2020-03-21: 4 mg via INTRAVENOUS

## 2020-03-21 MED ORDER — MIDAZOLAM HCL 5 MG/5ML IJ SOLN
INTRAMUSCULAR | Status: DC | PRN
  Administered 2020-03-21: 1 mg via INTRAVENOUS

## 2020-03-21 MED ORDER — FENTANYL CITRATE (PF) 100 MCG/2ML IJ SOLN: 25.0000 ug | INTRAMUSCULAR | Status: DC | PRN

## 2020-03-21 MED ORDER — OXYCODONE HCL 5 MG PO TABS: 5.0000 mg | ORAL_TABLET | Freq: Once | ORAL | Status: DC | PRN

## 2020-03-21 MED ORDER — PROPOFOL 10 MG/ML IV BOLUS
INTRAVENOUS | Status: DC | PRN
  Administered 2020-03-21: 200 mg via INTRAVENOUS

## 2020-03-21 MED ORDER — FENTANYL CITRATE (PF) 100 MCG/2ML IJ SOLN
INTRAMUSCULAR | Status: DC | PRN
  Administered 2020-03-21: 50 ug via INTRAVENOUS

## 2020-03-21 MED ORDER — DIPHENHYDRAMINE HCL 50 MG/ML IJ SOLN
INTRAMUSCULAR | Status: AC
  Filled 2020-03-21: qty 1

## 2020-03-21 SURGICAL SUPPLY — 14 items
BAG URO CATCHER STRL LF (MISCELLANEOUS) ×3 IMPLANT
CATH INTERMIT  6FR 70CM (CATHETERS) ×3 IMPLANT
CLOTH BEACON ORANGE TIMEOUT ST (SAFETY) ×3 IMPLANT
GLOVE BIOGEL M STRL SZ7.5 (GLOVE) ×3 IMPLANT
GOWN STRL REUS W/TWL LRG LVL3 (GOWN DISPOSABLE) ×6 IMPLANT
GUIDEWIRE STR DUAL SENSOR (WIRE) ×3 IMPLANT
GUIDEWIRE ZIPWRE .038 STRAIGHT (WIRE) IMPLANT
KIT TURNOVER KIT A (KITS) IMPLANT
MANIFOLD NEPTUNE II (INSTRUMENTS) ×3 IMPLANT
PACK CYSTO (CUSTOM PROCEDURE TRAY) ×3 IMPLANT
STENT URET 6FRX24 CONTOUR (STENTS) ×3 IMPLANT
TUBING CONNECTING 10 (TUBING) ×2 IMPLANT
TUBING CONNECTING 10' (TUBING) ×1
TUBING UROLOGY SET (TUBING) IMPLANT

## 2020-03-21 NOTE — Discharge Instructions (Signed)

## 2020-03-21 NOTE — Transfer of Care (Signed)
Immediate Anesthesia Transfer of Care Note  Patient: [name]  Procedure(s) Performed: CYSTOSCOPY WITH STENT PLACEMENT (Right )  Patient Location: PACU  Anesthesia Type:General  Level of Consciousness: oriented, drowsy, patient cooperative and responds to stimulation  Airway & Oxygen Therapy: Patient Spontanous Breathing and Patient connected to face mask oxygen  Post-op Assessment: Report given to RN and Post -op Vital signs reviewed and stable  Post vital signs: Reviewed and stable  Last Vitals:  Vitals Value Taken Time  BP 133/85 03/21/20 1733  Temp    Pulse 58 03/21/20 1736  Resp 16 03/21/20 1736  SpO2 100 % 03/21/20 1736  Vitals shown include unvalidated device data.  Last Pain:  Vitals:   03/21/20 1426  TempSrc:   PainSc: 7          Complications: No complications documented.

## 2020-03-21 NOTE — Anesthesia Postprocedure Evaluation (Signed)
Anesthesia Post Note  Patient: [name]  Procedure(s) Performed: CYSTOSCOPY WITH STENT PLACEMENT (Right )     Patient location during evaluation: PACU Anesthesia Type: General Level of consciousness: awake and alert Pain management: pain level controlled Vital Signs Assessment: post-procedure vital signs reviewed and stable Respiratory status: spontaneous breathing, nonlabored ventilation and respiratory function stable Cardiovascular status: blood pressure returned to baseline and stable Postop Assessment: no apparent nausea or vomiting Anesthetic complications: no   No complications documented.  Last Vitals:  Vitals:   03/21/20 1745 03/21/20 1800  BP: 136/73 130/76  Pulse: 62 62  Resp: 16 16  Temp:    SpO2: 100% 96%    Last Pain:  Vitals:   03/21/20 1800  TempSrc:   PainSc: [place]

## 2020-03-21 NOTE — Op Note (Signed)
Preoperative diagnosis:  1. Right ureteral calculus   Postoperative diagnosis:  1. Right ureteral calculus   Procedure:  1. Cystoscopy 2. Right ureteral stent placement (6 x 24 - no string)  Surgeon: Roxy Horseman, Brooke Bonito. M.D.  Anesthesia: General  Complications: None  EBL: Minimal  Specimens: None  Indication: [name] is a 60 y.o. patient with a symptomatic right ureteral stone and infection. After reviewing the management options for treatment, he elected to proceed with the above surgical procedure(s). We have discussed the potential benefits and risks of the procedure, side effects of the proposed treatment, the likelihood of the patient achieving the goals of the procedure, and any potential problems that might occur during the procedure or recuperation. Informed consent has been obtained.  Description of procedure:  The patient was taken to the operating room and general anesthesia was induced.  The patient was placed in the dorsal lithotomy position, prepped and draped in the usual sterile fashion, and preoperative antibiotics were administered. A preoperative time-out was performed.   Cystourethroscopy was performed.  The patient's urethra was examined and was normal. The bladder was then systematically examined in its entirety. There was no evidence for any bladder tumors, stones, or other mucosal pathology.    A 0.38 sensor guidewire was then advanced up the right ureter into the renal pelvis under fluoroscopic guidance.  The wire was then backloaded through the cystoscope and a ureteral stent was advance over the wire using Seldinger technique.  The stent was positioned appropriately under fluoroscopic and cystoscopic guidance.  The wire was then removed with an adequate stent curl noted in the renal pelvis as well as in the bladder.  The bladder was then emptied and the procedure ended.  The patient appeared to tolerate the procedure well and without  complications.  The patient was able to be awakened and transferred to the recovery unit in satisfactory condition.    Pryor Curia MD

## 2020-03-21 NOTE — Anesthesia Preprocedure Evaluation (Addendum)
Anesthesia Evaluation  Patient identified by MRN, date of birth, ID band Patient awake    Reviewed: Allergy & Precautions, NPO status , Patient's Chart, lab work & pertinent test results  History of Anesthesia Complications Negative for: history of anesthetic complications  Airway Mallampati: II  TM Distance: >3 FB Neck ROM: Full    Dental  (+) Teeth Intact   Pulmonary sleep apnea , Current Smoker,  Recent COVID infection (Sep 2021)   Pulmonary exam normal        Cardiovascular hypertension, Pt. on medications Normal cardiovascular exam     Neuro/Psych Anxiety Depression negative neurological ROS     GI/Hepatic Neg liver ROS, GERD  Medicated,  Endo/Other  negative endocrine ROS  Renal/GU Renal disease (R ureteral stone)  negative genitourinary   Musculoskeletal  (+) Arthritis ,   Abdominal   Peds  Hematology negative hematology ROS (+)   Anesthesia Other Findings   Reproductive/Obstetrics                          Anesthesia Physical Anesthesia Plan  ASA: III  Anesthesia Plan: General   Post-op Pain Management:    Induction: Intravenous  PONV Risk Score and Plan: 2 and Ondansetron, Midazolam and Treatment may vary due to age or medical condition  Airway Management Planned: LMA  Additional Equipment: None  Intra-op Plan:   Post-operative Plan: Extubation in OR  Informed Consent: I have reviewed the patients History and Physical, chart, labs and discussed the procedure including the risks, benefits and alternatives for the proposed anesthesia with the patient or authorized representative who has indicated his/her understanding and acceptance.     Dental advisory given  Plan Discussed with:   Anesthesia Plan Comments:       Anesthesia Quick Evaluation

## 2020-03-21 NOTE — Anesthesia Procedure Notes (Signed)
Procedure Name: LMA Insertion Date/Time: 03/21/2020 5:10 PM Performed by: Silas [place], CRNA Pre-anesthesia Checklist: Patient identified, Emergency Drugs available, Suction available and Patient being monitored Patient Re-evaluated:Patient Re-evaluated prior to induction Oxygen Delivery Method: Circle system utilized Preoxygenation: Pre-oxygenation with 100% oxygen Induction Type: IV induction Tube type: Oral Number of attempts: 1 Placement Confirmation: positive ETCO2 and breath sounds checked- equal and bilateral Tube secured with: Tape Dental Injury: Teeth and Oropharynx as per pre-operative assessment

## 2020-03-21 NOTE — H&P (Signed)
Office Visit Report     03/21/2020   --------------------------------------------------------------------------------   [name]  MRN: [id number]  DOB: 04-12-1961, 59 year old Female  [id number]:-[id number]   PRIMARY CARE:  Teodora Medici. Maudie [facility]), MD  REFERRING:    PROVIDER:  Raynelle Bring, M.D.  LOCATION:  [facility] [phone]     --------------------------------------------------------------------------------   CC/HPI: Right flank pain   [name] is a 59 year old female followed by Dr. Alyson Ingles for a history of recurrent UTIs. She was last seen 1 year ago. She also has a history of urolithiasis in the distant past. Her past surgical history is significant for gastric bypass suggesting that she may have hyperoxaluria. She developed the acute onset of right-sided flank pain overnight. This was associated with severe nausea and vomiting. She had radiation of the pain around her right lower quadrant with suprapubic pressure and the need to void. She denies any fever. She denies any recent dysuria. She did take Aleve earlier today with some relief. This does feel similar to her prior kidney stone events.     ALLERGIES: Penicillins    MEDICATIONS: Amlodipine Besylate 5 mg tablet Oral  Effexor Xr 37.5 mg capsule, ext release 24 hr Oral  Vitamin D2     GU PSH: Cysto Uretero Lithotripsy - 2009 Cystoscopy Insert Stent - 2009 Hysterectomy Unilat SO - 2007 Percut Stone Removal >2cm - 2009       PSH Notes: Percutaneous Lithotomy For Stone Over 2cm., Parathyroid Surgery, Cystoscopy With Insertion Of Ureteral Stent Left, Cystoscopy With Ureteroscopy With Lithotripsy, Hysterectomy   NON-GU PSH: Gastric bypass Thyroidectomy     GU PMH: Chronic cystitis (with hematuria) - 03/31/2019, - 03/04/2019, - 01/28/2019 Overactive bladder - 03/31/2019, - 96/78/9381 Renal colic, Renal colic - 0175 Ureteral calculus, Ureteral calculus - 2015 Bladder Stone, Bladder  calculus - 2014 Renal calculus, Nephrolithiasis - 2014      PMH Notes:  2006-04-03 10:10:43 - Note: Acute Myocardial Infarction   NON-GU PMH: Personal history of other diseases of the nervous system and sense organs, History of sleep apnea - 2015 Personal history of other diseases of the circulatory system, History of hypertension - 2014 Personal history of other diseases of the digestive system, History of esophageal reflux - 2014 Encounter for general adult medical examination without abnormal findings, Encounter for preventive health examination GERD Hypertension    FAMILY HISTORY: 1 Daughter - Other 1 son - Other Family Health Status Number - Runs In Family Myocardial Infarction - Runs In Family No pertinent family history - Runs In Family   SOCIAL HISTORY: Marital Status: Married Ethnicity: Not Hispanic Or Latino; Race: White Current Smoking Status: Patient does not smoke anymore. Has not smoked since 01/17/2018. Smoked for 17 years. Smoked 1/2 pack per day.   Tobacco Use Assessment Completed: Used Tobacco in last 30 days? Has never drank.  Drinks 1 caffeinated drink per day. Patient's occupation Economist.     Notes: Current every day smoker, Mother's age, Caffeine use, Two children, Father deceased, Occupation:, Marital History - Currently Married, Tobacco Use   REVIEW OF SYSTEMS:    GU Review Female:   Patient denies frequent urination, hard to postpone urination, burning /pain with urination, get up at night to urinate, leakage of urine, stream starts and stops, trouble starting your stream, have to strain to urinate, and currently pregnant.  Gastrointestinal (Lower):   Patient denies diarrhea and constipation.  Gastrointestinal (Upper):   Patient denies vomiting and nausea.  Constitutional:   Patient denies fever, night sweats, weight loss, and fatigue.  Skin:   Patient denies skin rash/ lesion and itching.  Eyes:   Patient denies blurred vision and double vision.   Ears/ Nose/ Throat:   Patient denies sore throat and sinus problems.  Hematologic/Lymphatic:   Patient denies swollen glands and easy bruising.  Cardiovascular:   Patient denies leg swelling and chest pains.  Respiratory:   Patient denies cough and shortness of breath.  Endocrine:   Patient denies excessive thirst.  Musculoskeletal:   Patient denies back pain and joint pain.  Neurological:   Patient denies headaches and dizziness.  Psychologic:   Patient denies depression and anxiety.   VITAL SIGNS:      03/21/2020 10:04 AM  Weight 198 lb / 89.81 kg  Height 64 in / 162.56 cm  BP 135/77 mmHg  Pulse 66 /min  Temperature 97.8 F / 36.5 C  BMI 34.0 kg/m   MULTI-SYSTEM PHYSICAL EXAMINATION:    Constitutional: Well-nourished. No physical deformities. Normally developed. Good grooming.  Respiratory: No labored breathing, no use of accessory muscles.   Cardiovascular: Normal temperature, normal extremity pulses, no swelling, no varicosities.  Neurologic / Psychiatric: Oriented to time, oriented to place, oriented to person. No depression, no anxiety, no agitation.  Gastrointestinal: Mild right CVA tenderness. No abdominal tenderness. No rebound tenderness. No abdominal masses.     Complexity of Data:  Records Review:   Previous Patient Records  X-Ray Review: C.T. Abdomen/Pelvis: Reviewed Films.    Notes:                     I independently reviewed her CT scan performed [facility] imaging today. This does confirm a 2-3 mm distal right ureteral calculus with associated hydroureteronephrosis above the level of the obstructing stone.   PROCEDURES:          Urinalysis w/Scope Dipstick Dipstick Cont'd Micro  Color: Yellow Bilirubin: Neg mg/dL WBC/hpf: >60/hpf  Appearance: Cloudy Ketones: Neg mg/dL RBC/hpf: 0 - 2/hpf  Specific Gravity: 1.025 Blood: 2+ ery/uL Bacteria: Many (>50/hpf)  pH: 6.0 Protein: Trace mg/dL Cystals: NS (Not Seen)  Glucose: Neg mg/dL Urobilinogen: 0.2 mg/dL Casts: NS  (Not Seen)    Nitrites: Positive Trichomonas: Not Present    Leukocyte Esterase: 3+ leu/uL Mucous: Not Present      Epithelial Cells: 6 - 10/hpf      Yeast: NS (Not Seen)      Sperm: Not Present    Notes: MICROSCOPIC NOT CONCENTRATED          Urinalysis w/Scope Dipstick Dipstick Cont'd Micro  Color: Yellow Bilirubin: Neg mg/dL WBC/hpf: >60/hpf  Appearance: Cloudy Ketones: Neg mg/dL RBC/hpf: 3 - 10/hpf  Specific Gravity: 1.025 Blood: 2+ ery/uL Bacteria: Many (>50/hpf)  pH: 6.0 Protein: 1+ mg/dL Cystals: NS (Not Seen)  Glucose: Neg mg/dL Urobilinogen: 0.2 mg/dL Casts: NS (Not Seen)    Nitrites: Positive Trichomonas: Not Present    Leukocyte Esterase: 3+ leu/uL Mucous: Not Present      Epithelial Cells: 0 - 5/hpf      Yeast: NS (Not Seen)      Sperm: Not Present    Notes: Unspun micro due to quantity          Ketoralac 30mg  - [id number], [id number] Qty: 30 Adm. By: Pricilla Riffle  Unit: mg Lot No [id number]  Route: IM Exp. Date 07/17/2020  Freq: None Mfgr.:   Site: Right Buttock   ASSESSMENT:      ICD-10  Details  2 GU:   Ureteral calculus - N20.1   1 NON-GU:   Bacteriuria - R82.71    PLAN:           Orders Labs Urine Culture  X-Rays: Outside CT Without Contrast. No Oral Contrast          Schedule         Document Letter(s):  Created for Patient: Clinical Summary         Notes:   1. Right ureteral calculus and urinary tract infection: She is not febrile but we did discuss the concern about possible renal infection with an obstructing stone. Ultimately, she was agreeable to proceed with cystoscopy and ureteral stent placement. Her urine will be cultured from her catheterized specimen today. She will be started on antibiotic therapy and this will be adjusted per her cultures. Since she is not febrile, I do think she can be discharged home on antibiotics. She will receive Toradol at this time for pain control. She will then be tentatively scheduled for ureteroscopy and stone  removal in the next few weeks after appropriate treatment of her current infection.   Cc: Dr. Jani Gravel        Next Appointment:      Next Appointment: 03/21/2020 04:30 PM    Appointment Type: Surgery     Location: [facility] [phone]    Provider: Raynelle Bring, M.D.    Reason for Visit: WL/OP CYSTO , RT STENT PLACEMENT

## 2020-03-22 ENCOUNTER — Encounter (HOSPITAL_COMMUNITY): Payer: Self-pay | Admitting: Urology

## 2020-04-03 NOTE — Patient Instructions (Addendum)
DUE TO COVID-19 ONLY ONE VISITOR IS ALLOWED TO COME WITH YOU AND STAY IN THE WAITING ROOM ONLY DURING PRE OP AND PROCEDURE DAY OF SURGERY. THE 1 VISITOR  MAY VISIT WITH YOU AFTER SURGERY IN YOUR PRIVATE ROOM DURING VISITING HOURS ONLY!  YOU NEED TO HAVE A COVID 19 TEST ON: 04/12/20 @ 2:30 PM , THIS TEST MUST BE DONE BEFORE SURGERY,  COVID TESTING SITE [facility] [place] [address], IT IS ON THE RIGHT GOING OUT [address] APPROXIMATELY  2 MINUTES PAST [employer] ON THE RIGHT. ONCE YOUR COVID TEST IS COMPLETED,  PLEASE BEGIN THE QUARANTINE INSTRUCTIONS AS OUTLINED IN YOUR HANDOUT.                Coutney Wildermuth     Your procedure is scheduled on: 04/16/20   Report to [facility] Main  Entrance   Report to admitting at : 1:30 PM     Call this number if you have problems the morning of surgery [phone]    Remember: Do not eat solid food :After Midnight. Clear liquids until: 12:30 pm.  CLEAR LIQUID DIET   Foods Allowed                                                                     Foods Excluded  Coffee and tea, regular and decaf                             liquids that you cannot  Plain Jell-O any favor except red or purple                                           see through such as: Fruit ices (not with fruit pulp)                                     milk, soups, orange juice  Iced Popsicles                                    All solid food Carbonated beverages, regular and diet                                    Cranberry, grape and apple juices Sports drinks like Gatorade Lightly seasoned clear broth or consume(fat free) Sugar, honey syrup  Sample Menu Breakfast                                Lunch                                     Supper Cranberry juice  Beef broth                            Chicken broth Jell-O                                     Grape juice                           Apple juice Coffee or  tea                        Jell-O                                      Popsicle                                                Coffee or tea                        Coffee or tea  _____________________________________________________________________  BRUSH YOUR TEETH MORNING OF SURGERY AND RINSE YOUR MOUTH OUT, NO CHEWING GUM CANDY OR MINTS.     Take these medicines the morning of surgery with A SIP OF WATER: amlodipine,esomeprazole.                                You may not have any metal on your body including hair pins and              piercings  Do not wear jewelry, make-up, lotions, powders or perfumes, deodorant             Do not wear nail polish on your fingernails.  Do not shave  48 hours prior to surgery.              Do not bring valuables to the hospital. [place].  Contacts, dentures or bridgework may not be worn into surgery.  Leave suitcase in the car. After surgery it may be brought to your room.     Patients discharged the day of surgery will not be allowed to drive home. IF YOU ARE HAVING SURGERY AND GOING HOME THE SAME DAY, YOU MUST HAVE AN ADULT TO DRIVE YOU HOME AND BE WITH YOU FOR 24 HOURS. YOU MAY GO HOME BY TAXI OR UBER OR ORTHERWISE, BUT AN ADULT MUST ACCOMPANY YOU HOME AND STAY WITH YOU FOR 24 HOURS.  Name and phone number of your driver:  Special Instructions: N/A              Please read over the following fact sheets you were given: _____________________________________________________________________         [facility] - Preparing for Surgery Before surgery, you can play an important role.  Because skin is not sterile, your skin needs to be as free of germs as possible.  You can reduce the number of germs on your skin by washing with  CHG (chlorahexidine gluconate) soap before surgery.  CHG is an antiseptic cleaner which kills germs and bonds with the skin to continue killing germs even after washing. Please  DO NOT use if you have an allergy to CHG or antibacterial soaps.  If your skin becomes reddened/irritated stop using the CHG and inform your nurse when you arrive at Short Stay. Do not shave (including legs and underarms) for at least 48 hours prior to the first CHG shower.  You may shave your face/neck. Please follow these instructions carefully:  1.  Shower with CHG Soap the night before surgery and the  morning of Surgery.  2.  If you choose to wash your hair, wash your hair first as usual with your  normal  shampoo.  3.  After you shampoo, rinse your hair and body thoroughly to remove the  shampoo.                           4.  Use CHG as you would any other liquid soap.  You can apply chg directly  to the skin and wash                       Gently with a scrungie or clean washcloth.  5.  Apply the CHG Soap to your body ONLY FROM THE NECK DOWN.   Do not use on face/ open                           Wound or open sores. Avoid contact with eyes, ears mouth and genitals (private parts).                       Wash face,  Genitals (private parts) with your normal soap.             6.  Wash thoroughly, paying special attention to the area where your surgery  will be performed.  7.  Thoroughly rinse your body with warm water from the neck down.  8.  DO NOT shower/wash with your normal soap after using and rinsing off  the CHG Soap.                9.  Pat yourself dry with a clean towel.            10.  Wear clean pajamas.            11.  Place clean sheets on your bed the night of your first shower and do not  sleep with pets. Day of Surgery : Do not apply any lotions/deodorants the morning of surgery.  Please wear clean clothes to the hospital/surgery center.  FAILURE TO FOLLOW THESE INSTRUCTIONS MAY RESULT IN THE CANCELLATION OF YOUR SURGERY PATIENT SIGNATURE_________________________________  NURSE  SIGNATURE__________________________________  ________________________________________________________________________

## 2020-04-04 ENCOUNTER — Encounter (HOSPITAL_COMMUNITY): Payer: Self-pay

## 2020-04-04 ENCOUNTER — Other Ambulatory Visit: Payer: Self-pay

## 2020-04-04 ENCOUNTER — Encounter (HOSPITAL_COMMUNITY)
Admission: RE | Admit: 2020-04-04 | Discharge: 2020-04-04 | Disposition: A | Discharge status: Home / Self Care | Payer: Managed Care, Other (non HMO) | Source: Ambulatory Visit | Attending: Urology | Admitting: Urology

## 2020-04-04 DIAGNOSIS — Z01818 Encounter for other preprocedural examination: Secondary | ICD-10-CM | POA: Insufficient documentation

## 2020-04-04 LAB — CBC
HCT: 41.7 % (ref 36.0–46.0)
Hemoglobin: 13.4 g/dL (ref 12.0–15.0)
MCH: 29.2 pg (ref 26.0–34.0)
MCHC: 32.1 g/dL (ref 30.0–36.0)
MCV: 90.8 fL (ref 80.0–100.0)
Platelets: 322 10*3/uL (ref 150–400)
RBC: 4.59 MIL/uL (ref 3.87–5.11)
RDW: 13.9 % (ref 11.5–15.5)
WBC: 7.6 10*3/uL (ref 4.0–10.5)
nRBC: 0 % (ref 0.0–0.2)

## 2020-04-04 LAB — BASIC METABOLIC PANEL
Anion gap: 8 (ref 5–15)
BUN: 23 mg/dL — ABNORMAL HIGH (ref 6–20)
CO2: 28 mmol/L (ref 22–32)
Calcium: 8.8 mg/dL — ABNORMAL LOW (ref 8.9–10.3)
Chloride: 105 mmol/L (ref 98–111)
Creatinine, Ser: 0.85 mg/dL (ref 0.44–1.00)
GFR, Estimated: >60 mL/min (ref >60)
Glucose, Bld: 119 mg/dL — ABNORMAL HIGH (ref 70–99)
Potassium: 4.5 mmol/L (ref 3.5–5.1)
Sodium: 141 mmol/L (ref 135–145)

## 2020-04-04 LAB — HEMOGLOBIN A1C
Hgb A1c MFr Bld: 5.4 % (ref 4.8–5.6)
Mean Plasma Glucose: 108.28 mg/dL

## 2020-04-04 NOTE — Progress Notes (Addendum)
COVID Vaccine Completed: Yes Date COVID Vaccine completed: 02/18/20 COVID vaccine manufacturer: [employer]    PCP - Dr. Jani Gravel Cardiologist - NO  Chest x-ray - 02/02/20. EPIC EKG -  Stress Test -  ECHO - 2017 Cardiac Cath -  Pacemaker/ICD device last checked:  Sleep Study - YES CPAP - NO  Fasting Blood Sugar -  Checks Blood Sugar _____ times a day  Blood Thinner Instructions: Aspirin Instructions: Last Dose:  Anesthesia review: Hx: HTN,OSA  Patient denies shortness of breath, fever, cough and chest pain at PAT appointment   Patient verbalized understanding of instructions that were given to them at the PAT appointment. Patient was also instructed that they will need to review over the PAT instructions again at home before surgery.

## 2020-04-06 NOTE — H&P (Signed)
Office Visit Report     04/05/2020   --------------------------------------------------------------------------------   [name]  MRN: [id number]  DOB: 07-Dec-1960, 59 year old Female  SSN: [id number]   PRIMARY CARE:  Teodora Medici. [name] [facility]), MD  REFERRING:    PROVIDER:  Raynelle Bring, M.D.  TREATING:  Daine Gravel, NP  LOCATION:  [facility] [phone]     --------------------------------------------------------------------------------   CC/HPI: Right flank pain   [name] is a 59 year old female followed by Dr. Alyson Ingles for a history of recurrent UTIs. She was last seen 1 year ago. She also has a history of urolithiasis in the distant past. Her past surgical history is significant for gastric bypass suggesting that she may have hyperoxaluria. She developed the acute onset of right-sided flank pain overnight. This was associated with severe nausea and vomiting. She had radiation of the pain around her right lower quadrant with suprapubic pressure and the need to void. She denies any fever. She denies any recent dysuria. She did take Aleve earlier today with some relief. This does feel similar to her prior kidney stone events.   Interval: She underwent emergent stent placement on 03/21/20. She did fairly well up until 03/29/20 when her pain became severe. A prescription for tramadol was sent to her pharmacy and she had finished her antibiotic dose. She reports that tramadol and hydrocodone are not controlling her pain and now she is very nauseated. She denies fevers, chills and uncontrolled vomiting. She did take ibuprofen last at 7:00 p.m. yesterday. She has been taking tamsulosin. She reports she is emptying her bladder fairly well although she is going frequently, the she does not associate this with any dysuria.     ALLERGIES: Cortizone    MEDICATIONS: Nexium  Tamsulosin Hcl 0.4 mg capsule 1 capsule PO Q HS  Amlodipine Besylate 5 mg tablet Oral   Effexor Xr 37.5 mg capsule, ext release 24 hr Oral  Tramadol Hcl 50 mg tablet 1-2 tablet PO Q 6 H PRN  Vitamin D2     GU PSH: Cysto Uretero Lithotripsy - 2009 Cystoscopy Insert Stent, Right - 03/21/2020, 2009 Hysterectomy Unilat SO - 2007 Percut Stone Removal >2cm - 2009       PSH Notes: Percutaneous Lithotomy For Stone Over 2cm., Parathyroid Surgery, Cystoscopy With Insertion Of Ureteral Stent Left, Cystoscopy With Ureteroscopy With Lithotripsy, Hysterectomy   NON-GU PSH: Gastric bypass Thyroidectomy     GU PMH: Ureteral calculus (Stable) - 03/21/2020, Ureteral calculus, - 2015 Chronic cystitis (with hematuria) - 03/31/2019, - 03/04/2019, - 01/28/2019 Overactive bladder - 03/31/2019, - 32/67/1245 Renal colic, Renal colic - 8099 Bladder Stone, Bladder calculus - 2014 Renal calculus, Nephrolithiasis - 2014      PMH Notes:  2006-04-03 10:10:43 - Note: Acute Myocardial Infarction   NON-GU PMH: Bacteriuria - 03/21/2020 Personal history of other diseases of the nervous system and sense organs, History of sleep apnea - 2015 Personal history of other diseases of the circulatory system, History of hypertension - 2014 Personal history of other diseases of the digestive system, History of esophageal reflux - 2014 Encounter for general adult medical examination without abnormal findings, Encounter for preventive health examination GERD Hypertension    FAMILY HISTORY: 1 Daughter - Other 1 son - Other Death - Father, Mother Family Health Status Number - Runs In Family Myocardial Infarction - Runs In Family No pertinent family history - Runs In Family   SOCIAL HISTORY: Marital Status: Married Preferred Language: English; Ethnicity: Not Hispanic  Or Latino; Race: White Current Smoking Status: Patient does not smoke anymore. Has not smoked since 01/17/2018. Smoked for 17 years. Smoked 1/2 pack per day.   Tobacco Use Assessment Completed: Used Tobacco in last 30 days? Has never drank.   Drinks 1 caffeinated drink per day. Patient's occupation Economist.     Notes: Current every day smoker, Mother's age, Caffeine use, Two children, Father deceased, Occupation:, Marital History - Currently Married, Tobacco Use   REVIEW OF SYSTEMS:    GU Review Female:   Patient denies frequent urination, hard to postpone urination, burning /pain with urination, get up at night to urinate, leakage of urine, stream starts and stops, trouble starting your stream, have to strain to urinate, and being pregnant.  Gastrointestinal (Upper):   Patient reports nausea. Patient denies vomiting and indigestion/ heartburn.  Gastrointestinal (Lower):   Patient denies diarrhea and constipation.  Constitutional:   Patient denies fever, night sweats, weight loss, and fatigue.  Skin:   Patient denies skin rash/ lesion and itching.  Eyes:   Patient denies blurred vision and double vision.  Ears/ Nose/ Throat:   Patient denies sore throat and sinus problems.  Hematologic/Lymphatic:   Patient denies swollen glands and easy bruising.  Cardiovascular:   Patient denies leg swelling and chest pains.  Respiratory:   Patient denies cough and shortness of breath.  Endocrine:   Patient denies excessive thirst.  Musculoskeletal:   Patient reports back pain. Patient denies joint pain.  Neurological:   Patient denies headaches and dizziness.  Psychologic:   Patient denies depression and anxiety.   VITAL SIGNS:      04/05/2020 02:03 PM  Weight 212 lb / 96.16 kg  Height 63 in / 160.02 cm  BP 147/80 mmHg  Pulse 72 /min  Temperature 98.2 F / 36.7 C  BMI 37.6 kg/m   MULTI-SYSTEM PHYSICAL EXAMINATION:    Constitutional: Well-nourished. No physical deformities. Normally developed. Good grooming. Tearful  Cardiovascular: Normal temperature, normal extremity pulses, no swelling, no varicosities.  Skin: No paleness, no jaundice, no cyanosis. No lesion, no ulcer, no rash.  Neurologic / Psychiatric: Oriented to time,  oriented to place, oriented to person. No depression, no anxiety, no agitation.  Gastrointestinal: Tender to the right lower quadrant, suprapubic area and right flank.   Musculoskeletal: Normal gait and station of head and neck.     Complexity of Data:  Source Of History:  Patient, Family/Caregiver, Medical Record Summary  Records Review:   Previous Doctor Records, Previous Hospital Records  Urine Test Review:   Urinalysis, Urine Culture  X-Ray Review: KUB: Reviewed Films. Discussed With Patient.  C.T. Abdomen/Pelvis: Reviewed Films. Reviewed Report.     04/05/20  Urinalysis  Urine Appearance Cloudy   Urine Color Amber   Urine Glucose Neg mg/dL  Urine Bilirubin Neg mg/dL  Urine Ketones Neg mg/dL  Urine Specific Gravity 1.020   Urine Blood 3+ ery/uL  Urine pH 6.5   Urine Protein 3+ mg/dL  Urine Urobilinogen 1.0 mg/dL  Urine Nitrites Neg   Urine Leukocyte Esterase 2+ leu/uL  Urine WBC/hpf 6 - 10/hpf   Urine RBC/hpf >60/hpf   Urine Epithelial Cells 0 - 5/hpf   Urine Bacteria Few (10-25/hpf)   Urine Mucous Present   Urine Yeast NS (Not Seen)   Urine Trichomonas Not Present   Urine Cystals NS (Not Seen)   Urine Casts NS (Not Seen)   Urine Sperm Not Present    PROCEDURES:  KUB - [id number]  A single view of the abdomen is obtained.      Patient confirmed No Neulasta OnPro Device.            Urinalysis w/Scope Dipstick Dipstick Cont'd Micro  Color: Amber Bilirubin: Neg mg/dL WBC/hpf: 6 - 10/hpf  Appearance: Cloudy Ketones: Neg mg/dL RBC/hpf: >60/hpf  Specific Gravity: 1.020 Blood: 3+ ery/uL Bacteria: Few (10-25/hpf)  pH: 6.5 Protein: 3+ mg/dL Cystals: NS (Not Seen)  Glucose: Neg mg/dL Urobilinogen: 1.0 mg/dL Casts: NS (Not Seen)    Nitrites: Neg Trichomonas: Not Present    Leukocyte Esterase: 2+ leu/uL Mucous: Present      Epithelial Cells: 0 - 5/hpf      Yeast: NS (Not Seen)      Sperm: Not Present    Notes: microscopic not concentrated          Ketoralac  60mg  - [id number], [id number] The area was prepped and cleaned using sterile technique. A band aid was applied. The pt tolerated well.   Qty: 60 Adm. By: Riley Churches  Unit: mg Lot No [id number]  Route: IM Exp. Date 06/11/2019  Freq: None Mfgr.:   Site: Right Buttock         Morphine 4mg  - J2270, [id number] Qty: 4 Adm. By: Riley Churches  Unit: mg Lot No [id number]  Route: IM Exp. Date 03/09/2020  Freq: None Mfgr.:   Site: Left Buttock         Phenergan 25mg  - J2550, [id number] Qty: 25 Adm. By: Riley Churches  Unit: mg Lot No [id number]  Route: IM Exp. Date 09/09/2019  Freq: None Mfgr.:   Site: Right Buttock   ASSESSMENT: None   PLAN:            Medications New Meds: Oxycodone-Acetaminophen 5 mg-325 mg tablet 1 tablet PO Q 6 H PRN   #20  0 Refill(s)  Promethazine Hcl 25 mg tablet 1 tablet PO Q 12 H PRN for nausea  #20  0 Refill(s)            Orders Labs CULTURE, URINE  X-Rays: KUB          Schedule Procedure: 04/05/2020 at [facility] - [place] 25mg  (Phenergan Per 50 Mg) - J2550, [phone]  Procedure: 04/05/2020 at [facility] - [phone] - Morphine 4mg  (Ther/Proph/Diag Inj, [place]/Im) - 37048, G8916  Procedure: 04/05/2020 at [facility] - [phone] - Ketoralac 60mg  (Toradol Per 15 Mg) - U8828, 00349          Document Letter(s):  Created for Patient: Clinical Summary         Notes:   Urinalysis not overly concerning for infectious process. Sent for precautionary culture. KUB shows right ureteral stent in position. Advised we attempt to make her comfortable with samples of myrbetriq, uribel and pain and nausea medications. Discussed her uncontrolled stent discomfort with Dr. Junious Silk who offered to take her to the OR a week earlier. She was agreeable to this. She was given an IM injection of morphine, Toradol and phenergan. Strict return precautions in the interval.         Next Appointment:      Next Appointment: 04/05/2020 02:00 PM     Appointment Type: Office Visit Established Patient    Location: [facility] [phone] [id number]    Provider: Daine Gravel, NP    Reason for Visit: pain not controlled 8/10 - surgery scheduled for 11/29      * Signed  by Daine Gravel, NP on 04/05/20 at 6:33 PM (EST)*

## 2020-04-06 NOTE — Progress Notes (Signed)
[name] made aware to arrive 04/09/2020 at 10:45 AM, no solid food after midnight, may have liquids until 9:45 AM, patient verbalized understanding.

## 2020-04-07 ENCOUNTER — Other Ambulatory Visit (HOSPITAL_COMMUNITY)
Admission: RE | Admit: 2020-04-07 | Discharge: 2020-04-07 | Disposition: A | Discharge status: Home / Self Care | Payer: Managed Care, Other (non HMO) | Source: Ambulatory Visit | Attending: Urology | Admitting: Urology

## 2020-04-07 NOTE — Progress Notes (Signed)
Patient tested positive for COVID 19 on January 27, 2020. Lab result under [facility] in care everywhere. No need to test today per anesthesia guidelines.

## 2020-04-09 ENCOUNTER — Ambulatory Visit (HOSPITAL_COMMUNITY): Payer: Managed Care, Other (non HMO)

## 2020-04-09 ENCOUNTER — Other Ambulatory Visit: Payer: Self-pay

## 2020-04-09 ENCOUNTER — Encounter (HOSPITAL_COMMUNITY)
Admission: RE | Disposition: A | Discharge status: Home / Self Care | Payer: Self-pay | Source: Home / Self Care | Attending: Urology

## 2020-04-09 ENCOUNTER — Observation Stay (HOSPITAL_COMMUNITY)
Admission: RE | Admit: 2020-04-09 | Discharge: 2020-04-10 | Disposition: A | Discharge status: Home / Self Care | Payer: Managed Care, Other (non HMO) | Attending: Urology | Admitting: Urology

## 2020-04-09 ENCOUNTER — Ambulatory Visit (HOSPITAL_COMMUNITY): Payer: Managed Care, Other (non HMO) | Admitting: Certified Registered Nurse Anesthetist

## 2020-04-09 ENCOUNTER — Encounter (HOSPITAL_COMMUNITY): Payer: Self-pay | Admitting: Urology

## 2020-04-09 DIAGNOSIS — Z9889 Other specified postprocedural states: Secondary | ICD-10-CM

## 2020-04-09 DIAGNOSIS — R1031 Right lower quadrant pain: Secondary | ICD-10-CM | POA: Diagnosis present

## 2020-04-09 DIAGNOSIS — Z87891 Personal history of nicotine dependence: Secondary | ICD-10-CM | POA: Insufficient documentation

## 2020-04-09 DIAGNOSIS — N201 Calculus of ureter: Secondary | ICD-10-CM | POA: Diagnosis not present

## 2020-04-09 HISTORY — PX: CYSTOSCOPY/URETEROSCOPY/HOLMIUM LASER/STENT PLACEMENT: SHX6546

## 2020-04-09 LAB — CBC WITH DIFFERENTIAL/PLATELET
Abs Immature Granulocytes: 0.04 10*3/uL (ref 0.00–0.07)
Basophils Absolute: 0 10*3/uL (ref 0.0–0.1)
Basophils Relative: 0 %
Eosinophils Absolute: 0 10*3/uL (ref 0.0–0.5)
Eosinophils Relative: 0 %
HCT: 36.4 % (ref 36.0–46.0)
Hemoglobin: 11.7 g/dL — ABNORMAL LOW (ref 12.0–15.0)
Immature Granulocytes: 0 %
Lymphocytes Relative: 3 %
Lymphs Abs: 0.3 10*3/uL — ABNORMAL LOW (ref 0.7–4.0)
MCH: 28.9 pg (ref 26.0–34.0)
MCHC: 32.1 g/dL (ref 30.0–36.0)
MCV: 89.9 fL (ref 80.0–100.0)
Monocytes Absolute: 0.2 10*3/uL (ref 0.1–1.0)
Monocytes Relative: 2 %
Neutro Abs: 10.3 10*3/uL — ABNORMAL HIGH (ref 1.7–7.7)
Neutrophils Relative %: 95 %
Platelets: 192 10*3/uL (ref 150–400)
RBC: 4.05 MIL/uL (ref 3.87–5.11)
RDW: 14.1 % (ref 11.5–15.5)
WBC: 10.8 10*3/uL — ABNORMAL HIGH (ref 4.0–10.5)
nRBC: 0 % (ref 0.0–0.2)

## 2020-04-09 LAB — BASIC METABOLIC PANEL
Anion gap: 11 (ref 5–15)
BUN: 21 mg/dL — ABNORMAL HIGH (ref 6–20)
CO2: 20 mmol/L — ABNORMAL LOW (ref 22–32)
Calcium: 8.1 mg/dL — ABNORMAL LOW (ref 8.9–10.3)
Chloride: 106 mmol/L (ref 98–111)
Creatinine, Ser: 0.98 mg/dL (ref 0.44–1.00)
GFR, Estimated: >60 mL/min (ref >60)
Glucose, Bld: 114 mg/dL — ABNORMAL HIGH (ref 70–99)
Potassium: 2.7 mmol/L — CL (ref 3.5–5.1)
Sodium: 137 mmol/L (ref 135–145)

## 2020-04-09 SURGERY — CYSTOSCOPY/URETEROSCOPY/HOLMIUM LASER/STENT PLACEMENT
Anesthesia: General | Laterality: Right

## 2020-04-09 MED ORDER — MIDAZOLAM HCL 2 MG/2ML IJ SOLN
INTRAMUSCULAR | Status: DC | PRN
  Administered 2020-04-09: 2 mg via INTRAVENOUS

## 2020-04-09 MED ORDER — CHLORHEXIDINE GLUCONATE 0.12 % MT SOLN
15.0000 mL | Freq: Once | OROMUCOSAL | Status: AC
  Administered 2020-04-09: 15 mL via OROMUCOSAL

## 2020-04-09 MED ORDER — TRAMADOL HCL 50 MG PO TABS: 50.0000 mg | ORAL_TABLET | Freq: Four times a day (QID) | ORAL | Status: DC | PRN

## 2020-04-09 MED ORDER — FENTANYL CITRATE (PF) 100 MCG/2ML IJ SOLN
25.0000 ug | INTRAMUSCULAR | Status: DC | PRN
  Administered 2020-04-09: 50 ug via INTRAVENOUS

## 2020-04-09 MED ORDER — ACETAMINOPHEN 325 MG PO TABS: 650.0000 mg | ORAL_TABLET | ORAL | Status: DC | PRN

## 2020-04-09 MED ORDER — FENTANYL CITRATE (PF) 100 MCG/2ML IJ SOLN
INTRAMUSCULAR | Status: AC
  Filled 2020-04-09: qty 2

## 2020-04-09 MED ORDER — KETOROLAC TROMETHAMINE 15 MG/ML IJ SOLN: 15.0000 mg | Freq: Four times a day (QID) | INTRAMUSCULAR | Status: DC | PRN

## 2020-04-09 MED ORDER — SODIUM CHLORIDE 0.9 % IV SOLN
1.0000 g | INTRAVENOUS | Status: DC
  Administered 2020-04-10: 1 g via INTRAVENOUS
  Filled 2020-04-09: qty 10

## 2020-04-09 MED ORDER — ONDANSETRON HCL 4 MG/2ML IJ SOLN
INTRAMUSCULAR | Status: AC
  Administered 2020-04-09: 4 mg via INTRAVENOUS
  Filled 2020-04-09: qty 2

## 2020-04-09 MED ORDER — DEXAMETHASONE SODIUM PHOSPHATE 4 MG/ML IJ SOLN: INTRAMUSCULAR | Status: DC | PRN

## 2020-04-09 MED ORDER — PROPOFOL 10 MG/ML IV BOLUS
INTRAVENOUS | Status: DC | PRN
  Administered 2020-04-09: 170 mg via INTRAVENOUS

## 2020-04-09 MED ORDER — LACTATED RINGERS IV SOLN: INTRAVENOUS | Status: DC

## 2020-04-09 MED ORDER — POTASSIUM CHLORIDE 2 MEQ/ML IV SOLN: INTRAVENOUS | Status: DC

## 2020-04-09 MED ORDER — ONDANSETRON HCL 4 MG/2ML IJ SOLN: 4.0000 mg | INTRAMUSCULAR | Status: DC | PRN

## 2020-04-09 MED ORDER — LIDOCAINE 2% (20 MG/ML) 5 ML SYRINGE
INTRAMUSCULAR | Status: DC | PRN
  Administered 2020-04-09: 60 mg via INTRAVENOUS

## 2020-04-09 MED ORDER — SODIUM CHLORIDE 0.9 % IR SOLN
Status: DC | PRN
  Administered 2020-04-09: 3000 mL

## 2020-04-09 MED ORDER — ACETAMINOPHEN 10 MG/ML IV SOLN
1000.0000 mg | Freq: Once | INTRAVENOUS | Status: AC
  Administered 2020-04-09: 1000 mg via INTRAVENOUS

## 2020-04-09 MED ORDER — ACETAMINOPHEN 10 MG/ML IV SOLN
INTRAVENOUS | Status: AC
  Filled 2020-04-09: qty 100

## 2020-04-09 MED ORDER — DIPHENHYDRAMINE HCL 50 MG/ML IJ SOLN: 12.5000 mg | Freq: Four times a day (QID) | INTRAMUSCULAR | Status: DC | PRN

## 2020-04-09 MED ORDER — DIPHENHYDRAMINE HCL 12.5 MG/5ML PO ELIX: 12.5000 mg | ORAL_SOLUTION | Freq: Four times a day (QID) | ORAL | Status: DC | PRN

## 2020-04-09 MED ORDER — VENLAFAXINE HCL ER 75 MG PO CP24
75.0000 mg | ORAL_CAPSULE | Freq: Every day | ORAL | Status: DC
  Administered 2020-04-10: 75 mg via ORAL
  Filled 2020-04-09: qty 1

## 2020-04-09 MED ORDER — LIDOCAINE 2% (20 MG/ML) 5 ML SYRINGE
INTRAMUSCULAR | Status: AC
  Filled 2020-04-09: qty 5

## 2020-04-09 MED ORDER — POTASSIUM CHLORIDE 10 MEQ/100ML IV SOLN: 10.0000 meq | INTRAVENOUS | Status: DC

## 2020-04-09 MED ORDER — KCL IN DEXTROSE-NACL 40-5-0.45 MEQ/L-%-% IV SOLN
INTRAVENOUS | Status: DC
  Filled 2020-04-09 (×2): qty 1000

## 2020-04-09 MED ORDER — FENTANYL CITRATE (PF) 100 MCG/2ML IJ SOLN
INTRAMUSCULAR | Status: DC | PRN
  Administered 2020-04-09 (×4): 50 ug via INTRAVENOUS

## 2020-04-09 MED ORDER — ONDANSETRON HCL 4 MG/2ML IJ SOLN: 4.0000 mg | Freq: Once | INTRAMUSCULAR | Status: AC | PRN

## 2020-04-09 MED ORDER — PROPOFOL 10 MG/ML IV BOLUS
INTRAVENOUS | Status: AC
  Filled 2020-04-09: qty 20

## 2020-04-09 MED ORDER — ONDANSETRON HCL 4 MG/2ML IJ SOLN
INTRAMUSCULAR | Status: DC | PRN
  Administered 2020-04-09: 4 mg via INTRAVENOUS

## 2020-04-09 MED ORDER — MIDAZOLAM HCL 2 MG/2ML IJ SOLN
INTRAMUSCULAR | Status: AC
  Filled 2020-04-09: qty 2

## 2020-04-09 MED ORDER — DEXAMETHASONE SODIUM PHOSPHATE 10 MG/ML IJ SOLN
INTRAMUSCULAR | Status: AC
  Filled 2020-04-09: qty 1

## 2020-04-09 MED ORDER — DOXYCYCLINE HYCLATE 50 MG PO CAPS: 100.0000 mg | ORAL_CAPSULE | Freq: Two times a day (BID) | ORAL | 0 refills | Status: DC

## 2020-04-09 MED ORDER — SODIUM CHLORIDE 0.9 % IV SOLN
2.0000 g | Freq: Once | INTRAVENOUS | Status: AC
  Administered 2020-04-09: 2 g via INTRAVENOUS
  Filled 2020-04-09: qty 20

## 2020-04-09 MED ORDER — ONDANSETRON HCL 4 MG/2ML IJ SOLN
INTRAMUSCULAR | Status: AC
  Filled 2020-04-09: qty 2

## 2020-04-09 MED ORDER — ORAL CARE MOUTH RINSE: 15.0000 mL | Freq: Once | OROMUCOSAL | Status: AC

## 2020-04-09 MED ORDER — PANTOPRAZOLE SODIUM 40 MG PO TBEC
40.0000 mg | DELAYED_RELEASE_TABLET | Freq: Every day | ORAL | Status: DC
  Administered 2020-04-10: 40 mg via ORAL
  Filled 2020-04-09: qty 1

## 2020-04-09 MED ORDER — AMLODIPINE BESYLATE 5 MG PO TABS: 5.0000 mg | ORAL_TABLET | Freq: Every day | ORAL | Status: DC

## 2020-04-09 SURGICAL SUPPLY — 20 items
BAG URO CATCHER STRL LF (MISCELLANEOUS) ×3 IMPLANT
BASKET ZERO TIP NITINOL 2.4FR (BASKET) ×3 IMPLANT
CATH INTERMIT  6FR 70CM (CATHETERS) ×3 IMPLANT
CLOTH BEACON ORANGE TIMEOUT ST (SAFETY) ×3 IMPLANT
GLOVE BIOGEL M STRL SZ7.5 (GLOVE) ×3 IMPLANT
GOWN STRL REUS W/TWL LRG LVL3 (GOWN DISPOSABLE) ×3 IMPLANT
GUIDEWIRE STR DUAL SENSOR (WIRE) ×3 IMPLANT
GUIDEWIRE ZIPWRE .038 STRAIGHT (WIRE) IMPLANT
IV NS 1000ML (IV SOLUTION) ×3
IV NS 1000ML BAXH (IV SOLUTION) ×1 IMPLANT
KIT TURNOVER KIT A (KITS) IMPLANT
LASER FIB FLEXIVA PULSE ID 365 (Laser) IMPLANT
MANIFOLD NEPTUNE II (INSTRUMENTS) ×3 IMPLANT
PACK CYSTO (CUSTOM PROCEDURE TRAY) ×3 IMPLANT
SHEATH URETERAL 12FRX35CM (MISCELLANEOUS) IMPLANT
TRACTIP FLEXIVA PULS ID 200XHI (Laser) IMPLANT
TRACTIP FLEXIVA PULSE ID 200 (Laser)
TUBING CONNECTING 10 (TUBING) ×2 IMPLANT
TUBING CONNECTING 10' (TUBING) ×1
TUBING UROLOGY SET (TUBING) ×3 IMPLANT

## 2020-04-09 NOTE — Progress Notes (Signed)
CRITICAL VALUE ALERT   Critical Value:  Potassium 2.7  Date & Time Notied:  04/09/20 @ 1850  Provider Notified:  Dr. Lovena Neighbours and Thompson Grayer  Orders Received/Actions taken: Waiting on a call back and F/U with night shift Nurse.

## 2020-04-09 NOTE — Progress Notes (Signed)
Patient ID: [name], female   DOB: 05/20/1960, 59 y.o.   MRN: [id number]  Day of Surgery Subjective: Pt noted to have low grade fever to 100.4.  Now 97 after Tylenol but still with hypoxia requiring 2-3 L oxygen.    Objective: Vital signs in last 24 hours: Temp:  [98.9 F (37.2 C)-103 F (39.4 C)] 102.6 F (39.2 C) (11/22 1615) Pulse Rate:  [68-105] 84 (11/22 1615) Resp:  [13-35] 26 (11/22 1615) BP: (117-183)/(68-89) 129/76 (11/22 1615) SpO2:  [88 %-97 %] 97 % (11/22 1615) Weight:  [96.2 kg] 96.2 kg (11/22 1106)  Intake/Output from previous day: No intake/output data recorded. Intake/Output this shift: Total I/O In: 100 [IV Piggyback:100] Out: 0   Physical Exam:  General: Alert and oriented CV: RRR Lungs: Clear Abdomen: Soft, ND, NT, No CVAT Ext: NT, No erythema   Studies/Results: DG Chest Port 1 View  Result Date: 04/09/2020 CLINICAL DATA:  Status post cystoscopy. EXAM: PORTABLE CHEST 1 VIEW COMPARISON:  None. FINDINGS: The heart size and mediastinal contours are within normal limits. Low lung volumes. Linear left basilar opacities. No visible pleural effusions or pneumothorax. The visualized skeletal structures are unremarkable. IMPRESSION: Low lung volumes with linear left opacities, favor atelectasis. Aspiration and/or pneumonia is not excluded. Electronically Signed   By: Margaretha Sheffield MD   On: 04/09/2020 15:29   DG C-Arm 1-60 Min-No Report  Result Date: 04/09/2020 Fluoroscopy was utilized by the requesting physician.  No radiographic interpretation.    Assessment/Plan: Mild fever/hypoxia s/p right ureteroscopic stone removal - Admit for observation - Aggressive pulmonary toilet, antibiotics and wean oxygen overnight   LOS: 0 days   Dutch Gray 04/09/2020, 4:26 PM

## 2020-04-09 NOTE — Discharge Instructions (Signed)
1. You may see some blood in the urine and may have some burning with urination for 48-72 hours. You also may notice that you have to urinate more frequently or urgently after your procedure which is normal.  °2. You should call should you develop an inability urinate, fever > 101, persistent nausea and vomiting that prevents you from eating or drinking to stay hydrated.  °

## 2020-04-09 NOTE — Anesthesia Postprocedure Evaluation (Signed)
Anesthesia Post Note  Patient: [name]  Procedure(s) Performed: CYSTOSCOPY/URETEROSCOPYWITH STONE REMOVAL (Right )     Patient location during evaluation: PACU Anesthesia Type: General Level of consciousness: awake and alert and oriented Pain management: pain level controlled Vital Signs Assessment: post-procedure vital signs reviewed and stable Respiratory status: spontaneous breathing, nonlabored ventilation and respiratory function stable Cardiovascular status: blood pressure returned to baseline and stable Postop Assessment: no apparent nausea or vomiting Anesthetic complications: no   No complications documented.  Last Vitals:  Vitals:   04/09/20 1300 04/09/20 1315  BP: (!) 153/82 (!) 183/89  Pulse: 68   Resp: (!) 24   Temp: 37.3 C   SpO2: 95%     Last Pain:  Vitals:   04/09/20 1106  TempSrc:   PainSc: 8                  Phoua Hoadley A.

## 2020-04-09 NOTE — Transfer of Care (Signed)
Immediate Anesthesia Transfer of Care Note  Patient: [name]  Procedure(s) Performed: CYSTOSCOPY/URETEROSCOPYWITH STONE REMOVAL (Right )  Patient Location: PACU  Anesthesia Type:General  Level of Consciousness: drowsy and patient cooperative  Airway & Oxygen Therapy: Patient Spontanous Breathing and Patient connected to face mask  Post-op Assessment: Report given to RN and Post -op Vital signs reviewed and stable  Post vital signs: Reviewed and stable  Last Vitals:  Vitals Value Taken Time  BP    Temp    Pulse    Resp 20 04/09/20 1252  SpO2    Vitals shown include unvalidated device data.  Last Pain:  Vitals:   04/09/20 1106  TempSrc:   PainSc: 8       Patients Stated Pain Goal: 4 (78/47/84 1282)  Complications: No complications documented.

## 2020-04-09 NOTE — Op Note (Signed)
Preoperative diagnosis: Right ureteral calculus  Postoperative diagnosis: Right ureteral calculus  Procedure:  1. Cystoscopy 2. Right ureteroscopy and stone removal  Surgeon: Pryor Curia. M.D.  Anesthesia: General  Complications: None  EBL: Minimal  Specimens: 1. Right ureteral calculus  Disposition of specimens: [facility] for stone analysis  Indication: [name] is a 59 y.o. year old patient with a symptomatic right ureteral stone s/p recent stent placement for infection. After reviewing the management options for treatment, the patient elected to proceed with the above surgical procedure(s). We have discussed the potential benefits and risks of the procedure, side effects of the proposed treatment, the likelihood of the patient achieving the goals of the procedure, and any potential problems that might occur during the procedure or recuperation. Informed consent has been obtained.  Description of procedure:  The patient was taken to the operating room and general anesthesia was induced.  The patient was placed in the dorsal lithotomy position, prepped and draped in the usual sterile fashion, and preoperative antibiotics were administered. A preoperative time-out was performed.   Cystourethroscopy was performed.  The patient's urethra was examined and was normal. The bladder was then systematically examined in its entirety. There was no evidence for any bladder tumors, stones, or other mucosal pathology.    Attention then turned to the right ureteral orifice and the indwelling stent was removed.  A 0.38 sensor guidewire was then advanced up the right ureter into the renal pelvis under fluoroscopic guidance. The 6 Fr semirigid ureteroscope was then advanced into the ureter next to the guidewire and the calculus was identified.  The stone was then removed intact with a zero tip nitinol basket.  Reinspection of the ureter revealed no remaining  visible stones or fragments.   Since the patient had been stented for the past few weeks, it was felt that her ureter was dilated enough that she would not require a stent.  The wire was then removed.  The bladder was then emptied and the procedure ended.  The patient appeared to tolerate the procedure well and without complications.  The patient was able to be awakened and transferred to the recovery unit in satisfactory condition.

## 2020-04-09 NOTE — Anesthesia Procedure Notes (Signed)
Procedure Name: LMA Insertion Date/Time: 04/09/2020 12:18 PM Performed by: Claudia Desanctis, CRNA Pre-anesthesia Checklist: Emergency Drugs available, Patient identified, Suction available and Patient being monitored Patient Re-evaluated:Patient Re-evaluated prior to induction Oxygen Delivery Method: Circle system utilized Preoxygenation: Pre-oxygenation with 100% oxygen Induction Type: IV induction Ventilation: Mask ventilation without difficulty LMA: LMA inserted LMA Size: 4.0 Number of attempts: 1 Placement Confirmation: positive ETCO2 and breath sounds checked- equal and bilateral Tube secured with: Tape Dental Injury: Teeth and Oropharynx as per pre-operative assessment

## 2020-04-09 NOTE — Anesthesia Preprocedure Evaluation (Signed)
Anesthesia Evaluation  Patient identified by MRN, date of birth, ID band Patient awake    Reviewed: Allergy & Precautions, NPO status , Patient's Chart, lab work & pertinent test results  Airway Mallampati: II  TM Distance: >3 FB Neck ROM: Full    Dental  (+) Teeth Intact   Pulmonary sleep apnea , Current Smoker and Patient abstained from smoking.,  Intolerant of CPAP   Pulmonary exam normal breath sounds clear to auscultation       Cardiovascular hypertension, Pt. on medications Normal cardiovascular exam Rhythm:Regular Rate:Normal  EKG 04/04/20 NSR, LAD, LVH   Neuro/Psych PSYCHIATRIC DISORDERS Anxiety Depression negative neurological ROS     GI/Hepatic Neg liver ROS, GERD  Medicated and Controlled,  Endo/Other  Obesity  Renal/GU Renal diseaseRight ureteral Calculus  negative genitourinary   Musculoskeletal  (+) Arthritis , Osteoarthritis,    Abdominal (+) + obese,   Peds  Hematology   Anesthesia Other Findings   Reproductive/Obstetrics                             Anesthesia Physical Anesthesia Plan  ASA: III  Anesthesia Plan: General   Post-op Pain Management:    Induction: Intravenous  PONV Risk Score and Plan: 4 or greater and Midazolam, Ondansetron, Scopolamine patch - Pre-op and Treatment may vary due to age or medical condition  Airway Management Planned: LMA  Additional Equipment:   Intra-op Plan:   Post-operative Plan: Extubation in OR  Informed Consent: I have reviewed the patients History and Physical, chart, labs and discussed the procedure including the risks, benefits and alternatives for the proposed anesthesia with the patient or authorized representative who has indicated his/her understanding and acceptance.     Dental advisory given  Plan Discussed with: Anesthesiologist and CRNA  Anesthesia Plan Comments:         Anesthesia Quick  Evaluation

## 2020-04-10 ENCOUNTER — Encounter (HOSPITAL_COMMUNITY): Payer: Self-pay | Admitting: Urology

## 2020-04-10 DIAGNOSIS — N201 Calculus of ureter: Secondary | ICD-10-CM | POA: Diagnosis not present

## 2020-04-10 LAB — URINE CULTURE

## 2020-04-10 LAB — CBC
HCT: 39.4 % (ref 36.0–46.0)
HCT: 37.2 % (ref 36.0–46.0)
Hemoglobin: 12.4 g/dL (ref 12.0–15.0)
Hemoglobin: 12 g/dL (ref 12.0–15.0)
MCH: 28.6 pg (ref 26.0–34.0)
MCH: 29.6 pg (ref 26.0–34.0)
MCHC: 31.5 g/dL (ref 30.0–36.0)
MCHC: 32.3 g/dL (ref 30.0–36.0)
MCV: 90.8 fL (ref 80.0–100.0)
MCV: 91.6 fL (ref 80.0–100.0)
Platelets: 198 10*3/uL (ref 150–400)
Platelets: 201 10*3/uL (ref 150–400)
RBC: 4.34 MIL/uL (ref 3.87–5.11)
RBC: 4.06 MIL/uL (ref 3.87–5.11)
RDW: 14.1 % (ref 11.5–15.5)
RDW: 14.1 % (ref 11.5–15.5)
WBC: 16.5 10*3/uL — ABNORMAL HIGH (ref 4.0–10.5)
WBC: 18 10*3/uL — ABNORMAL HIGH (ref 4.0–10.5)
nRBC: 0 % (ref 0.0–0.2)
nRBC: 0 % (ref 0.0–0.2)

## 2020-04-10 LAB — BASIC METABOLIC PANEL
Anion gap: 10 (ref 5–15)
BUN: 21 mg/dL — ABNORMAL HIGH (ref 6–20)
CO2: 23 mmol/L (ref 22–32)
Calcium: 8.8 mg/dL — ABNORMAL LOW (ref 8.9–10.3)
Chloride: 107 mmol/L (ref 98–111)
Creatinine, Ser: 1.01 mg/dL — ABNORMAL HIGH (ref 0.44–1.00)
GFR, Estimated: >60 mL/min (ref >60)
Glucose, Bld: 191 mg/dL — ABNORMAL HIGH (ref 70–99)
Potassium: 4.1 mmol/L (ref 3.5–5.1)
Sodium: 140 mmol/L (ref 135–145)

## 2020-04-10 NOTE — Progress Notes (Addendum)
Patient ID: [name], female   DOB: Dec 29, 1960, 59 y.o.   MRN: [id number]  1 Day Post-Op Subjective: Pt subjectively feels improved.  Temperature had increased to 103 in PACU yesterday but afebrile since then.  Still on oxygen 2 L overnight.  She has been able to ambulate.  She denies pain or subjective shortness of breath.    Objective: Vital signs in last 24 hours: Temp:  [97.4 F (36.3 C)-103 F (39.4 C)] 97.7 F (36.5 C) (11/23 0600) Pulse Rate:  [52-105] 52 (11/23 0600) Resp:  [13-35] 18 (11/23 0600) BP: (98-183)/(61-89) 99/61 (11/23 0600) SpO2:  [88 %-100 %] 95 % (11/23 0600) Weight:  [96.2 kg] 96.2 kg (11/22 1106)  Intake/Output from previous day: 11/22 0701 - 11/23 0700 In: 1123.1 [P.O.:240; I.V.:683.1; IV Piggyback:200] Out: 1300 [Urine:1300] Intake/Output this shift: No intake/output data recorded.  Physical Exam:  General: Alert and oriented CV: RRR Lungs: Clear Abdomen: Soft, ND, NT, No CVAT Ext: NT, No erythema  Lab Results: Recent Labs    04/09/20 1753 04/10/20 0537  HGB 11.7* 12.4  HCT 36.4 39.4   CBC Latest Ref Rng & Units 04/10/2020 04/09/2020 04/04/2020  WBC 4.0 - 10.5 K/uL 16.5(H) 10.8(H) 7.6  Hemoglobin 12.0 - 15.0 g/dL 12.4 11.7(L) 13.4  Hematocrit 36 - 46 % 39.4 36.4 41.7  Platelets 150 - 400 K/uL 198 192 322     BMET Recent Labs    04/09/20 1753 04/10/20 0537  NA 137 140  K 2.7* 4.1  CL 106 107  CO2 20* 23  GLUCOSE 114* 191*  BUN 21* 21*  CREATININE 0.98 1.01*  CALCIUM 8.1* 8.8*     Studies/Results: DG Chest Port 1 View  Result Date: 04/09/2020 CLINICAL DATA:  Status post cystoscopy. EXAM: PORTABLE CHEST 1 VIEW COMPARISON:  None. FINDINGS: The heart size and mediastinal contours are within normal limits. Low lung volumes. Linear left basilar opacities. No visible pleural effusions or pneumothorax. The visualized skeletal structures are unremarkable. IMPRESSION: Low lung volumes with linear left opacities, favor  atelectasis. Aspiration and/or pneumonia is not excluded. Electronically Signed   By: Margaretha Sheffield MD   On: 04/09/2020 15:29   DG C-Arm 1-60 Min-No Report  Result Date: 04/09/2020 Fluoroscopy was utilized by the requesting physician.  No radiographic interpretation.    Assessment/Plan: Fever/right ureteral stone s/p ureteroscopic removal yesterday - Fever presumed to be related to persistent/recurrent infection of GU source.  On ceftriaxone based on prior culture.  Urine culture from OR yesterday pending. Leukocytosis this morning.  Will recheck later today. - Hypoxia improving but still on 2 L oxygen.  Likely related to above.  Will continue to wean and aggressive pulmonary toilet. - Re-evaluate this afternoon for likely discharge if continuing to improve and not requiring oxygen. - Hypokalemia replaced and now corrected - Continue IVF and hold AM antihypertensive - Pt's husband updated   LOS: 0 days   Dutch Gray 04/10/2020, 7:03 AM

## 2020-04-10 NOTE — Progress Notes (Signed)
This RN reviewed AVS discharge summary with pt. All questions answered by RN at this time. All pt belongings returned to pt. PIV removed. Education and care plan completed by RN. Pt was taken to main lobby by NT in the wheelchair on RA where she was met by her husband to be taken home.

## 2020-04-10 NOTE — Progress Notes (Signed)
The spouse of the patient stated that he was not aware that the patient was admitted to the floor. The spouse [name] [phone] ) would like the MD to call him with updates on the patient and also to let him know if the patient will be discharged today.

## 2020-04-10 NOTE — Progress Notes (Signed)
Patient ID: [name], female   DOB: 1961/04/21, 59 y.o.   MRN: [id number]   1 Day Post-Op Subjective: Pt feeling much improved.  Off oxygen with sats > 95% on RA.  Tolerating diet.  Objective: Vital signs in last 24 hours: Temp:  [97.4 F (36.3 C)-102.6 F (39.2 C)] 98 F (36.7 C) (11/23 1257) Pulse Rate:  [52-98] 52 (11/23 1257) Resp:  [13-27] 18 (11/23 1257) BP: (98-154)/(60-87) 109/72 (11/23 1257) SpO2:  [93 %-100 %] 96 % (11/23 1257)  Intake/Output from previous day: 11/22 0701 - 11/23 0700 In: 1123.1 [P.O.:240; I.V.:683.1; IV Piggyback:200] Out: 1900 [Urine:1900] Intake/Output this shift: Total I/O In: 581.6 [I.V.:481.6; IV Piggyback:100] Out: 300 [Urine:300]  Physical Exam:  General: Alert and oriented   Lab Results: Recent Labs    04/09/20 1753 04/10/20 0537 04/10/20 1338  HGB 11.7* 12.4 12.0  HCT 36.4 39.4 37.2   CBC Latest Ref Rng & Units 04/10/2020 04/10/2020 04/09/2020  WBC 4.0 - 10.5 K/uL 18.0(H) 16.5(H) 10.8(H)  Hemoglobin 12.0 - 15.0 g/dL 12.0 12.4 11.7(L)  Hematocrit 36 - 46 % 37.2 39.4 36.4  Platelets 150 - 400 K/uL 201 198 192     BMET Recent Labs    04/09/20 1753 04/10/20 0537  NA 137 140  K 2.7* 4.1  CL 106 107  CO2 20* 23  GLUCOSE 114* 191*  BUN 21* 21*  CREATININE 0.98 1.01*  CALCIUM 8.1* 8.8*     Studies/Results:  Assessment/Plan: Despite persistent leukocytosis, patient is significantly improved clinically with no fever over about the last 24 hrs.  Cultures from yesterday remain pending.  She wishes to go home.  Will plan to discharge on oral antibiotics based on prior culture results pending results from yesterday.  She has been advised to call or return to ED if she clinically worsens.   LOS: 0 days   Dutch Gray 04/10/2020, 3:35 PM

## 2020-04-10 NOTE — Discharge Summary (Signed)
  Date of admission: 04/09/2020  Date of discharge: 04/10/2020  Admission diagnosis: 04/09/20  Discharge diagnosis: 04/10/20  Secondary diagnoses: GERD, HTN  History and Physical: For full details, please see admission history and physical. Briefly, Marieanne Marxen is a 59 y.o. year old patient with a right ureteral stone s/p prior right ureteral stent to manage ureteral obstruction in setting of infection.  She returns today to undergo definitive treatment of her right ureteral stone.   Hospital Course: She underwent right ureteroscopic stone removal and stent removal on 04/09/20.  She developed fever in the PACU to 103 and was admitted for observation and IV antibiotic therapy with ceftriaxone based on prior culture results.  A urine culture had been obtained in the OR as well.  She was clinically improved the following day despite a worsening leukocytosis.  On the afternoon of POD #1, she was off oxygen and had been afebrile for 24 hrs.  She elected to be discharged on oral antibiotics.  Laboratory values: Recent Labs    04/09/20 1753 04/10/20 0537  HGB 11.7* 12.4  HCT 36.4 39.4   Recent Labs    04/09/20 1753 04/10/20 0537  CREATININE 0.98 1.01*    Disposition: Home with return precautions discussed.  Discharge instruction: The patient was instructed to be ambulatory but told to refrain from heavy lifting, strenuous activity, or driving.   Discharge medications:  Allergies as of 04/10/2020      Reactions   Cortisone Swelling    SWELLING REACTION UNSPECIFIED    Prednisone Swelling   SWELLING REACTION UNSPECIFIED    Hydrocodone Itching      Medication List    STOP taking these medications   HYDROcodone-acetaminophen 5-325 MG tablet Commonly known as: NORCO/VICODIN   ondansetron 4 MG tablet Commonly known as: Zofran   sulfamethoxazole-trimethoprim 800-160 MG tablet Commonly known as: BACTRIM DS     TAKE these medications   amLODipine 5 MG tablet Commonly  known as: NORVASC Take 5 mg by mouth daily.   doxycycline 50 MG capsule Commonly known as: VIBRAMYCIN Take 2 capsules (100 mg total) by mouth 2 (two) times daily.   esomeprazole 20 MG capsule Commonly known as: NEXIUM Take 20 mg by mouth every evening.   ibuprofen 200 MG tablet Commonly known as: ADVIL Take 400 mg by mouth every 8 (eight) hours as needed (pain.).   traMADol 50 MG tablet Commonly known as: ULTRAM Take 50 mg by mouth every 6 (six) hours as needed (for pain.).   venlafaxine XR 75 MG 24 hr capsule Commonly known as: EFFEXOR-XR Take 75 mg by mouth daily.   Vitamin D-3 125 MCG (5000 UT) Tabs Take 5,000 Units by mouth every morning.       Followup:   Follow-up Information    Raynelle Bring, MD.   Specialty: Urology Why: 05/08/20 at 9:45 AM Contact information: [place] [id number] [phone]

## 2020-04-13 ENCOUNTER — Other Ambulatory Visit (HOSPITAL_COMMUNITY): Payer: Managed Care, Other (non HMO)

## 2020-12-20 ENCOUNTER — Emergency Department (HOSPITAL_COMMUNITY): Payer: Managed Care, Other (non HMO)

## 2020-12-20 ENCOUNTER — Encounter (HOSPITAL_COMMUNITY): Payer: Self-pay | Admitting: Emergency Medicine

## 2020-12-20 ENCOUNTER — Emergency Department (HOSPITAL_BASED_OUTPATIENT_CLINIC_OR_DEPARTMENT_OTHER): Payer: Managed Care, Other (non HMO)

## 2020-12-20 ENCOUNTER — Observation Stay (HOSPITAL_COMMUNITY)
Admission: EM | Admit: 2020-12-20 | Discharge: 2020-12-21 | Disposition: A | Discharge status: Home / Self Care | Payer: Managed Care, Other (non HMO) | Attending: Internal Medicine | Admitting: Internal Medicine

## 2020-12-20 DIAGNOSIS — M79604 Pain in right leg: Secondary | ICD-10-CM

## 2020-12-20 DIAGNOSIS — Z79899 Other long term (current) drug therapy: Secondary | ICD-10-CM | POA: Diagnosis not present

## 2020-12-20 DIAGNOSIS — M79671 Pain in right foot: Secondary | ICD-10-CM | POA: Diagnosis not present

## 2020-12-20 DIAGNOSIS — I10 Essential (primary) hypertension: Secondary | ICD-10-CM | POA: Diagnosis not present

## 2020-12-20 DIAGNOSIS — K219 Gastro-esophageal reflux disease without esophagitis: Secondary | ICD-10-CM | POA: Diagnosis present

## 2020-12-20 DIAGNOSIS — F17210 Nicotine dependence, cigarettes, uncomplicated: Secondary | ICD-10-CM | POA: Diagnosis not present

## 2020-12-20 DIAGNOSIS — R072 Precordial pain: Secondary | ICD-10-CM | POA: Diagnosis not present

## 2020-12-20 DIAGNOSIS — G4733 Obstructive sleep apnea (adult) (pediatric): Secondary | ICD-10-CM | POA: Diagnosis present

## 2020-12-20 DIAGNOSIS — M25561 Pain in right knee: Secondary | ICD-10-CM | POA: Insufficient documentation

## 2020-12-20 DIAGNOSIS — R0602 Shortness of breath: Secondary | ICD-10-CM

## 2020-12-20 DIAGNOSIS — F419 Anxiety disorder, unspecified: Secondary | ICD-10-CM | POA: Diagnosis present

## 2020-12-20 DIAGNOSIS — R0789 Other chest pain: Secondary | ICD-10-CM

## 2020-12-20 DIAGNOSIS — R079 Chest pain, unspecified: Secondary | ICD-10-CM | POA: Diagnosis present

## 2020-12-20 DIAGNOSIS — Z20822 Contact with and (suspected) exposure to covid-19: Secondary | ICD-10-CM | POA: Insufficient documentation

## 2020-12-20 DIAGNOSIS — E785 Hyperlipidemia, unspecified: Secondary | ICD-10-CM | POA: Diagnosis present

## 2020-12-20 DIAGNOSIS — Z96641 Presence of right artificial hip joint: Secondary | ICD-10-CM | POA: Insufficient documentation

## 2020-12-20 DIAGNOSIS — M7989 Other specified soft tissue disorders: Secondary | ICD-10-CM

## 2020-12-20 DIAGNOSIS — R7303 Prediabetes: Secondary | ICD-10-CM | POA: Insufficient documentation

## 2020-12-20 DIAGNOSIS — F32A Depression, unspecified: Secondary | ICD-10-CM | POA: Diagnosis present

## 2020-12-20 DIAGNOSIS — R0600 Dyspnea, unspecified: Secondary | ICD-10-CM

## 2020-12-20 DIAGNOSIS — R0609 Other forms of dyspnea: Secondary | ICD-10-CM | POA: Diagnosis present

## 2020-12-20 DIAGNOSIS — R52 Pain, unspecified: Secondary | ICD-10-CM

## 2020-12-20 LAB — COMPREHENSIVE METABOLIC PANEL
ALT: 16 U/L (ref 0–44)
AST: 17 U/L (ref 15–41)
Albumin: 3.8 g/dL (ref 3.5–5.0)
Alkaline Phosphatase: 102 U/L (ref 38–126)
Anion gap: 9 (ref 5–15)
BUN: 21 mg/dL — ABNORMAL HIGH (ref 6–20)
CO2: 23 mmol/L (ref 22–32)
Calcium: 8.8 mg/dL — ABNORMAL LOW (ref 8.9–10.3)
Chloride: 109 mmol/L (ref 98–111)
Creatinine, Ser: 0.98 mg/dL (ref 0.44–1.00)
GFR, Estimated: >60 mL/min (ref >60)
Glucose, Bld: 104 mg/dL — ABNORMAL HIGH (ref 70–99)
Potassium: 4 mmol/L (ref 3.5–5.1)
Sodium: 141 mmol/L (ref 135–145)
Total Bilirubin: 0.6 mg/dL (ref 0.3–1.2)
Total Protein: 6.4 g/dL — ABNORMAL LOW (ref 6.5–8.1)

## 2020-12-20 LAB — CBC WITH DIFFERENTIAL/PLATELET
Abs Immature Granulocytes: 0.04 10*3/uL (ref 0.00–0.07)
Basophils Absolute: 0 10*3/uL (ref 0.0–0.1)
Basophils Relative: 1 %
Eosinophils Absolute: 0.1 10*3/uL (ref 0.0–0.5)
Eosinophils Relative: 2 %
HCT: 41.5 % (ref 36.0–46.0)
Hemoglobin: 13 g/dL (ref 12.0–15.0)
Immature Granulocytes: 1 %
Lymphocytes Relative: 28 %
Lymphs Abs: 2.2 10*3/uL (ref 0.7–4.0)
MCH: 27.7 pg (ref 26.0–34.0)
MCHC: 31.3 g/dL (ref 30.0–36.0)
MCV: 88.5 fL (ref 80.0–100.0)
Monocytes Absolute: 0.6 10*3/uL (ref 0.1–1.0)
Monocytes Relative: 8 %
Neutro Abs: 4.7 10*3/uL (ref 1.7–7.7)
Neutrophils Relative %: 60 %
Platelets: 265 10*3/uL (ref 150–400)
RBC: 4.69 MIL/uL (ref 3.87–5.11)
RDW: 14.1 % (ref 11.5–15.5)
WBC: 7.7 10*3/uL (ref 4.0–10.5)
nRBC: 0 % (ref 0.0–0.2)

## 2020-12-20 LAB — CBC
HCT: 42.7 % (ref 36.0–46.0)
Hemoglobin: 13.2 g/dL (ref 12.0–15.0)
MCH: 27.8 pg (ref 26.0–34.0)
MCHC: 30.9 g/dL (ref 30.0–36.0)
MCV: 89.9 fL (ref 80.0–100.0)
Platelets: 290 10*3/uL (ref 150–400)
RBC: 4.75 MIL/uL (ref 3.87–5.11)
RDW: 13.9 % (ref 11.5–15.5)
WBC: 8.1 10*3/uL (ref 4.0–10.5)
nRBC: 0 % (ref 0.0–0.2)

## 2020-12-20 LAB — TROPONIN I (HIGH SENSITIVITY)
Troponin I (High Sensitivity): 5 ng/L (ref <18)
Troponin I (High Sensitivity): 6 ng/L (ref <18)

## 2020-12-20 LAB — D-DIMER, QUANTITATIVE: D-Dimer, Quant: 0.62 ug/mL-FEU — ABNORMAL HIGH (ref 0.00–0.50)

## 2020-12-20 MED ORDER — IOHEXOL 350 MG/ML SOLN
100.0000 mL | Freq: Once | INTRAVENOUS | Status: AC | PRN
  Administered 2020-12-20: 100 mL via INTRAVENOUS

## 2020-12-20 MED ORDER — BUSPIRONE HCL 5 MG PO TABS
7.5000 mg | ORAL_TABLET | Freq: Every day | ORAL | Status: DC
  Administered 2020-12-21: 7.5 mg via ORAL
  Filled 2020-12-20: qty 1.5

## 2020-12-20 MED ORDER — VENLAFAXINE HCL ER 75 MG PO CP24
75.0000 mg | ORAL_CAPSULE | Freq: Every day | ORAL | Status: DC
  Administered 2020-12-21: 75 mg via ORAL
  Filled 2020-12-20: qty 1

## 2020-12-20 MED ORDER — HYDROMORPHONE HCL 1 MG/ML IJ SOLN
0.5000 mg | Freq: Once | INTRAMUSCULAR | Status: AC
  Administered 2020-12-20: 0.5 mg via INTRAVENOUS
  Filled 2020-12-20: qty 1

## 2020-12-20 MED ORDER — ZOLPIDEM TARTRATE 5 MG PO TABS
5.0000 mg | ORAL_TABLET | Freq: Every evening | ORAL | Status: DC | PRN
  Administered 2020-12-21: 5 mg via ORAL
  Filled 2020-12-20: qty 1

## 2020-12-20 MED ORDER — METHOCARBAMOL 500 MG PO TABS: 500.0000 mg | ORAL_TABLET | Freq: Three times a day (TID) | ORAL | Status: DC | PRN

## 2020-12-20 MED ORDER — PANTOPRAZOLE SODIUM 40 MG PO TBEC
40.0000 mg | DELAYED_RELEASE_TABLET | Freq: Every day | ORAL | Status: DC
  Administered 2020-12-21: 40 mg via ORAL
  Filled 2020-12-20: qty 1

## 2020-12-20 MED ORDER — ENOXAPARIN SODIUM 40 MG/0.4ML IJ SOSY
40.0000 mg | PREFILLED_SYRINGE | INTRAMUSCULAR | Status: DC
  Filled 2020-12-20: qty 0.4

## 2020-12-20 MED ORDER — AMLODIPINE BESYLATE 5 MG PO TABS
5.0000 mg | ORAL_TABLET | Freq: Every day | ORAL | Status: DC
  Administered 2020-12-21: 5 mg via ORAL
  Filled 2020-12-20: qty 1

## 2020-12-20 NOTE — H&P (Addendum)
History and Physical    [name] DOB: 1960-10-19 DOA: 12/20/2020  PCP: [name], DO  Patient coming from: Home.  Chief Complaint: Chest pain and shortness of breath and right foot pain.  HPI: [name] is a 60 y.o. female with history of hypertension, depression presents to the ER because of worsening right foot pain and also has been complaining of chest pain with shortness of breath.  Patient states about a week ago patient developed right knee pain for which her orthopedic surgeon placed her on prednisone.  Over the last 4 days patient also started allopurinol right foot pain.  Had gone to her orthopedic surgeon again and was instructed to come to the ER because to rule out DVT.  Patient states that he has been in some substernal pressure about 6 days ago which was present even at rest.  Has been having off-and-on shortness of breath since her COVID last year.  ED Course: In the ER patient was chest pain-free high sensitive troponins were negative EKG was unremarkable.  Labs are largely unremarkable COVID test negative.  CT angiogram was negative for PE Dopplers were negative for DVT.  Patient still complains of substantial right foot pain.  On exam patient's ankle does not show any obvious swelling.  Pulses are good.  Patient admitted for further management of right foot pain and chest pain.  Review of Systems: As per HPI, rest all negative.   Past Medical History:  Diagnosis Date   Anxiety    Depression    Fatty liver    History of kidney stones    Hyperlipidemia    Hyperparathyroidism ([place])    Hypertension    Nephrolithiasis    Obesity    OSA (obstructive sleep apnea) 03/09/2013   denies; does not use CPAP    Pre-diabetes    last A1C in 2015 5.9% see 01-13-14 lab epic    Sleep apnea    Vitamin D deficiency    Wound, breast    superficial appearing , reddened area to left upper breast; per patient, has been in sun recently , denies pain or  drainage , "just itchy"     Past Surgical History:  Procedure Laterality Date   BACK SURGERY     BREAST LUMPECTOMY WITH RADIOACTIVE SEED LOCALIZATION Right 02/05/2018   Procedure: BREAST LUMPECTOMY WITH RADIOACTIVE SEED LOCALIZATION;  Surgeon: Alphonsa Overall, MD;  Location: [facility];  Service: General;  Laterality: Right;   BREATH TEK H PYLORI N/A 08/31/2014   Procedure: [place];  Surgeon: Alphonsa Overall, MD;  Location: [facility];  Service: General;  Laterality: N/A;   CYSTOSCOPY WITH STENT PLACEMENT Right 03/21/2020   Procedure: CYSTOSCOPY WITH STENT PLACEMENT;  Surgeon: Raynelle Bring, MD;  Location: [facility];  Service: Urology;  Laterality: Right;   CYSTOSCOPY/URETEROSCOPY/HOLMIUM LASER/STENT PLACEMENT Right 04/09/2020   Procedure: CYSTOSCOPY/URETEROSCOPYWITH STONE REMOVAL;  Surgeon: Raynelle Bring, MD;  Location: [facility];  Service: Urology;  Laterality: Right;   LAPAROSCOPIC GASTRIC SLEEVE RESECTION N/A 02/20/2015   Procedure: LAPAROSCOPIC GASTRIC SLEEVE RESECTION;  Surgeon: Alphonsa Overall, MD;  Location: [facility];  Service: General;  Laterality: N/A;   PARTIAL HYSTERECTOMY     sleep study  04/08/13   THYROID SURGERY     TOTAL HIP ARTHROPLASTY Right 02/27/2017   Procedure: RIGHT TOTAL HIP ARTHROPLASTY ANTERIOR APPROACH;  Surgeon: Dorna Leitz, MD;  Location: [facility];  Service: Orthopedics;  Laterality: Right;  Panel Lenght: 120 mins     reports  that she has been smoking cigarettes. She has a 5.00 pack-year smoking history. She has never used smokeless tobacco. She reports that she does not drink alcohol and does not use drugs.  Allergies  Allergen Reactions   Cortisone Swelling and Rash   Aspirin Other (See Comments)    "Upsets my stomach"   Lisinopril Cough   Hydrocodone Itching   Sulfa Antibiotics Rash    Family History  Problem Relation Age of Onset   COPD Mother    Hypertension Mother    Heart disease Mother    Heart attack Mother    Heart attack Father        died age  4 with the heart attack   Heart disease Father    Hypertension Sister    Hypertension Brother    Hypertension Sister    Other Son        died age 3, autopsy pending possible aneurysm   Colon cancer Neg Hx    Breast cancer Neg Hx     Prior to Admission medications   Medication Sig Start Date End Date Taking? Authorizing Provider  amLODipine (NORVASC) 5 MG tablet Take 5 mg by mouth daily.   Yes [provider]  busPIRone (BUSPAR) 7.5 MG tablet Take 7.5 mg by mouth in the morning.   Yes [provider]  Cholecalciferol (VITAMIN D-3) 5000 UNITS TABS Take 5,000 Units by mouth every morning.    Yes [provider]  esomeprazole (NEXIUM) 20 MG capsule Take 20 mg by mouth at bedtime.   Yes [provider]  ibuprofen (ADVIL) 200 MG tablet Take 400 mg by mouth every 8 (eight) hours as needed (for pain).   Yes [provider]  methocarbamol (ROBAXIN) 500 MG tablet Take 500 mg by mouth every 8 (eight) hours as needed for muscle spasms.   Yes [provider]  traMADol (ULTRAM) 50 MG tablet Take 50 mg by mouth every 6 (six) hours as needed (for pain).   Yes [provider]  venlafaxine XR (EFFEXOR-XR) 75 MG 24 hr capsule Take 75 mg by mouth in the morning. 12/11/16  Yes [provider]  zolpidem (AMBIEN) 5 MG tablet Take 5 mg by mouth at bedtime as needed for sleep. 11/29/20  Yes [provider]    Physical Exam: Constitutional: Moderately built and nourished. Vitals:   12/20/20 1830 12/20/20 1942 12/20/20 2000 12/20/20 2030  BP: (!) 144/97 (!) 125/97 (!) 146/86 (!) 153/94  Pulse: 64 (!) 59 (!) 58 69  Resp: '15 20 12 12  '$ Temp:      TempSrc:      SpO2: 94% 95% 92% 92%  Weight:      Height:       Eyes: Anicteric no pallor. ENMT: No discharge from the ears eyes nose and mouth. Neck: No mass felt.  No neck rigidity. Respiratory: No rhonchi or crepitations. Cardiovascular: S1-S2 heard. Abdomen: Soft nontender bowel  sound present. Musculoskeletal: Pain on moving her right foot.  Mild tenderness on palpation. Skin: No rash. Neurologic: Alert awake oriented to time place and person.  Moves all extremities. Psychiatric: Appears normal.  Normal affect.   Labs on Admission: I have personally reviewed following labs and imaging studies  CBC: Recent Labs  Lab 12/20/20 1646  WBC 7.7  NEUTROABS 4.7  HGB 13.0  HCT 41.5  MCV 88.5  PLT 99991111   Basic Metabolic Panel: Recent Labs  Lab 12/20/20 1646  NA 141  K 4.0  CL 109  CO2 23  GLUCOSE 104*  BUN 21*  CREATININE 0.98  CALCIUM 8.8*   GFR: Estimated Creatinine Clearance: 69.2 mL/min (by C-G formula based on SCr of 0.98 mg/dL). Liver Function Tests: Recent Labs  Lab 12/20/20 1646  AST 17  ALT 16  ALKPHOS 102  BILITOT 0.6  PROT 6.4*  ALBUMIN 3.8   No results for input(s): LIPASE, AMYLASE in the last 168 hours. No results for input(s): AMMONIA in the last 168 hours. Coagulation Profile: No results for input(s): INR, PROTIME in the last 168 hours. Cardiac Enzymes: No results for input(s): CKTOTAL, CKMB, CKMBINDEX, TROPONINI in the last 168 hours. BNP (last 3 results) No results for input(s): PROBNP in the last 8760 hours. HbA1C: No results for input(s): HGBA1C in the last 72 hours. CBG: No results for input(s): GLUCAP in the last 168 hours. Lipid Profile: No results for input(s): CHOL, HDL, LDLCALC, TRIG, CHOLHDL, LDLDIRECT in the last 72 hours. Thyroid Function Tests: No results for input(s): TSH, T4TOTAL, FREET4, T3FREE, THYROIDAB in the last 72 hours. Anemia Panel: No results for input(s): VITAMINB12, FOLATE, FERRITIN, TIBC, IRON, RETICCTPCT in the last 72 hours. Urine analysis:    Component Value Date/Time   COLORURINE YELLOW 01/10/2019 1527   APPEARANCEUR HAZY (A) 01/10/2019 1527   LABSPEC 1.018 01/10/2019 1527   PHURINE 5.0 01/10/2019 1527   GLUCOSEU NEGATIVE 01/10/2019 1527   HGBUR SMALL (A) 01/10/2019 1527    BILIRUBINUR NEGATIVE 01/10/2019 1527   KETONESUR 5 (A) 01/10/2019 1527   PROTEINUR 30 (A) 01/10/2019 1527   UROBILINOGEN 0.2 03/21/2014 2327   NITRITE NEGATIVE 01/10/2019 1527   LEUKOCYTESUR LARGE (A) 01/10/2019 1527   Sepsis Labs: '@LABRCNTIP'$ (procalcitonin:4,lacticidven:4) )No results found for this or any previous visit (from the past 240 hour(s)).   Radiological Exams on Admission: DG Chest 2 View  Result Date: 12/20/2020 CLINICAL DATA:  Chest pain.  Shortness of breath EXAM: CHEST - 2 VIEW COMPARISON:  Chest x-ray 04/09/2020, CT chest 12/31/2016 FINDINGS: The heart size and mediastinal contours are unchanged. Aortic calcification. No focal consolidation. No pulmonary edema. No pleural effusion. No pneumothorax. No acute osseous abnormality. IMPRESSION: No active cardiopulmonary disease. Electronically Signed   By: Iven Finn M.D.   On: 12/20/2020 17:41   CT Angio Chest PE W and/or Wo Contrast  Result Date: 12/20/2020 CLINICAL DATA:  Chest pain or SOB, pleurisy or effusion suspected. Pt c/o cp and shob that's been on and off since Saturday. Also reports right knee pain that has spread to back of leg and down to ankle. EXAM: CT ANGIOGRAPHY CHEST WITH CONTRAST TECHNIQUE: Multidetector CT imaging of the chest was performed using the standard protocol during bolus administration of intravenous contrast. Multiplanar CT image reconstructions and MIPs were obtained to evaluate the vascular anatomy. CONTRAST:  114m OMNIPAQUE IOHEXOL 350 MG/ML SOLN COMPARISON:  None. FINDINGS: Cardiovascular: Satisfactory opacification of the pulmonary arteries to the segmental level. Limited evaluation more distally due to motion artifact. No evidence of pulmonary embolism. The main pulmonary artery measures at the upper limits of normal. Normal heart size. No significant pericardial effusion. The thoracic aorta is normal in caliber. Mild atherosclerotic plaque of the thoracic aorta. At least 1 vessel coronary artery  calcifications. Mediastinum/Nodes: No enlarged mediastinal, hilar, or axillary lymph nodes. Thyroid gland, trachea, and esophagus demonstrate no significant findings. Lungs/Pleura: Expiratory phase of respiration with mosaic attenuation of the lungs. No focal consolidation. Few scattered pulmonary micronodules. No pulmonary mass. No pleural effusion. No pneumothorax. Upper Abdomen: Multiple calcified stones within the  gallbladder lumen. Gastric sleeve surgical changes. No acute abnormality. Musculoskeletal: No chest wall abnormality. No suspicious lytic or blastic osseous lesions. No acute displaced fracture. Multilevel degenerative changes of the spine. Review of the MIP images confirms the above findings. IMPRESSION: 1. No pulmonary embolus. 2. No acute intrathoracic abnormality. 3. Cholelithiasis. Electronically Signed   By: Iven Finn M.D.   On: 12/20/2020 19:13   VAS [place] LOWER EXTREMITY VENOUS (DVT) (ONLY [facility])  Result Date: 12/20/2020  Lower Venous DVT Study Patient Name:  DOUA LEAR  Date of Exam:   12/20/2020 Medical Rec #: [id number]           Accession #:    [id number] Date of Birth: 03-31-1961            Patient Gender: F Patient Age:   24Y Exam Location:  [facility] Procedure:      VAS [place] LOWER EXTREMITY VENOUS (DVT) Referring Phys: Broadus John ZAMMIT --------------------------------------------------------------------------------  Indications: Pain, Swelling, and SOB. Other Indications: Patient with RLE knee, calf and ankle pain for past few days                    - negative x-ray '@PCP'$  sent for DVT rule out. Comparison Study: No previous exams Performing Technologist: Jody Hill RVT, RDMS  Examination Guidelines: A complete evaluation includes B-mode imaging, spectral Doppler, color Doppler, and power Doppler as needed of all accessible portions of each vessel. Bilateral testing is considered an integral part of a complete examination. Limited examinations for reoccurring indications  may be performed as noted. The reflux portion of the exam is performed with the patient in reverse Trendelenburg.  +---------+---------------+---------+-----------+----------+--------------+ RIGHT    CompressibilityPhasicitySpontaneityPropertiesThrombus Aging +---------+---------------+---------+-----------+----------+--------------+ CFV      Full           Yes      Yes                                 +---------+---------------+---------+-----------+----------+--------------+ SFJ      Full                                                        +---------+---------------+---------+-----------+----------+--------------+ FV Prox  Full           Yes      Yes                                 +---------+---------------+---------+-----------+----------+--------------+ FV Mid   Full           Yes      Yes                                 +---------+---------------+---------+-----------+----------+--------------+ FV DistalFull           Yes      Yes                                 +---------+---------------+---------+-----------+----------+--------------+ PFV      Full                                                        +---------+---------------+---------+-----------+----------+--------------+  POP      Full           Yes      Yes                                 +---------+---------------+---------+-----------+----------+--------------+ PTV      Full                                                        +---------+---------------+---------+-----------+----------+--------------+ PERO     Full                                                        +---------+---------------+---------+-----------+----------+--------------+   +----+---------------+---------+-----------+----------+--------------+ LEFTCompressibilityPhasicitySpontaneityPropertiesThrombus Aging +----+---------------+---------+-----------+----------+--------------+ CFV Full            Yes      Yes                                 +----+---------------+---------+-----------+----------+--------------+    Summary: RIGHT: - There is no evidence of superficial venous thrombosis. - There is no evidence of deep vein thrombosis in the lower extremity.  - No cystic structure found in the popliteal fossa.  LEFT: - No evidence of common femoral vein obstruction.  *See table(s) above for measurements and observations.    Preliminary     EKG: Independently reviewed.  Normal sinus rhythm.  Assessment/Plan Principal Problem:   Chest pain Active Problems:   OSA (obstructive sleep apnea)   HTN (hypertension)    Chest pain and shortness of breath -chest pain happened almost a week ago which was substernal.  Lasted for 10 minutes at rest.  Presently asymptomatic.  CT angiogram unremarkable.  High sensitive troponins are negative.  Will consult cardiology for further opinion. Right foot pain and recent right knee pain we will check uric acid levels and x-rays. Hypertension on amlodipine. Depression and anxiety on venlafaxine and BuSpar. Gallstones seen in the CAT scan appears to be asymptomatic at this time.  COVID test is pending.   DVT prophylaxis: Lovenox. Code Status: Full code. Family Communication: Discussed with patient. Disposition Plan: Home. Consults called: Cardiology. Admission status: Observation.   Rise Patience MD [facility] Pager [phone].  If 7PM-7AM, please contact night-coverage [email or web address] Password [facility]  12/20/2020, 10:22 PM

## 2020-12-20 NOTE — ED Notes (Signed)
Below order not completed by EW. 

## 2020-12-20 NOTE — Progress Notes (Signed)
RLE venous duplex has been completed.  Preliminary results given to Dr. Roderic Palau.  Results can be found under chart review under CV PROC. 12/20/2020 6:43 PM Aayla Marrocco RVT, RDMS

## 2020-12-20 NOTE — ED Provider Notes (Signed)
[facility] DEPT Provider Note   CSN: [id number] Arrival date & time: 12/20/20  1627     History Chief Complaint  Patient presents with   Chest Pain   Shortness of Breath   Leg Pain    [name] is a 60 y.o. female.  Patient states that she has been having chest pain off and on for couple weeks.  Sometimes it feels like a pressure.  She also has some shortness of breath and she will perspire occasionally.  Patient has history of hypertension and hyperlipidemia.  Patient also complains of pain in her right calf and swelling  The history is provided by the patient and medical records. No language interpreter was used.  Chest Pain Pain location:  Substernal area Pain quality: aching   Pain severity:  Moderate Onset quality:  Sudden Timing:  Intermittent Progression:  Worsening Chronicity:  New Context: not breathing   Relieved by:  Nothing Worsened by:  Nothing Associated symptoms: shortness of breath   Associated symptoms: no abdominal pain, no back pain, no cough, no fatigue and no headache   Shortness of Breath Associated symptoms: chest pain   Associated symptoms: no abdominal pain, no cough, no headaches and no rash   Leg Pain Associated symptoms: no back pain and no fatigue       Past Medical History:  Diagnosis Date   Anxiety    Depression    Fatty liver    History of kidney stones    Hyperlipidemia    Hyperparathyroidism ([place])    Hypertension    Nephrolithiasis    Obesity    OSA (obstructive sleep apnea) 03/09/2013   denies; does not use CPAP    Pre-diabetes    last A1C in 2015 5.9% see 01-13-14 lab epic    Sleep apnea    Vitamin D deficiency    Wound, breast    superficial appearing , reddened area to left upper breast; per patient, has been in sun recently , denies pain or drainage , "just itchy"     Patient Active Problem List   Diagnosis Date Noted   Ureteral calculus 04/09/2020   Pyelonephritis    UTI  (urinary tract infection) 01/10/2019   HTN (hypertension) 01/10/2019   GERD (gastroesophageal reflux disease) 01/10/2019   Anxiety 01/10/2019   Tobacco use 01/10/2019   Primary osteoarthritis of right hip 02/27/2017   Morbid obesity ([place]) 02/20/2015   Chest pain, unspecified 01/13/2014   Chest pain 01/13/2014   Abnormal EKG- inferior TWI 01/13/2014   OSA (obstructive sleep apnea) 03/09/2013    Past Surgical History:  Procedure Laterality Date   BACK SURGERY     BREAST LUMPECTOMY WITH RADIOACTIVE SEED LOCALIZATION Right 02/05/2018   Procedure: BREAST LUMPECTOMY WITH RADIOACTIVE SEED LOCALIZATION;  Surgeon: Alphonsa Overall, MD;  Location: [facility];  Service: General;  Laterality: Right;   BREATH TEK H PYLORI N/A 08/31/2014   Procedure: BREATH Eliezer Champagne;  Surgeon: Alphonsa Overall, MD;  Location: [facility];  Service: General;  Laterality: N/A;   CYSTOSCOPY WITH STENT PLACEMENT Right 03/21/2020   Procedure: CYSTOSCOPY WITH STENT PLACEMENT;  Surgeon: Raynelle Bring, MD;  Location: [facility];  Service: Urology;  Laterality: Right;   CYSTOSCOPY/URETEROSCOPY/HOLMIUM LASER/STENT PLACEMENT Right 04/09/2020   Procedure: CYSTOSCOPY/URETEROSCOPYWITH STONE REMOVAL;  Surgeon: Raynelle Bring, MD;  Location: [facility];  Service: Urology;  Laterality: Right;   LAPAROSCOPIC GASTRIC SLEEVE RESECTION N/A 02/20/2015   Procedure: LAPAROSCOPIC GASTRIC SLEEVE RESECTION;  Surgeon: Alphonsa Overall, MD;  Location: [facility];  Service: General;  Laterality: N/A;   PARTIAL HYSTERECTOMY     sleep study  04/08/13   THYROID SURGERY     TOTAL HIP ARTHROPLASTY Right 02/27/2017   Procedure: RIGHT TOTAL HIP ARTHROPLASTY ANTERIOR APPROACH;  Surgeon: Dorna Leitz, MD;  Location: [facility];  Service: Orthopedics;  Laterality: Right;  Panel Lenght: 120 mins     OB History   No obstetric history on file.     Family History  Problem Relation Age of Onset   COPD Mother    Hypertension Mother    Heart disease Mother    Heart attack Mother     Heart attack Father        died age 22 with the heart attack   Heart disease Father    Hypertension Sister    Hypertension Brother    Hypertension Sister    Other Son        died age 21, autopsy pending possible aneurysm   Colon cancer Neg Hx    Breast cancer Neg Hx     Social History   Tobacco Use   Smoking status: Every Day    Packs/day: 0.25    Years: 20.00    Pack years: 5.00    Types: Cigarettes   Smokeless tobacco: Never  Vaping Use   Vaping Use: Never used  Substance Use Topics   Alcohol use: No    Alcohol/week: 0.0 standard drinks   Drug use: No    Home Medications Prior to Admission medications   Medication Sig Start Date End Date Taking? Authorizing Provider  amLODipine (NORVASC) 5 MG tablet Take 5 mg by mouth daily.   Yes [provider]  busPIRone (BUSPAR) 7.5 MG tablet Take 7.5 mg by mouth in the morning.   Yes [provider]  Cholecalciferol (VITAMIN D-3) 5000 UNITS TABS Take 5,000 Units by mouth every morning.    Yes [provider]  esomeprazole (NEXIUM) 20 MG capsule Take 20 mg by mouth at bedtime.   Yes [provider]  ibuprofen (ADVIL) 200 MG tablet Take 400 mg by mouth every 8 (eight) hours as needed (for pain).   Yes [provider]  methocarbamol (ROBAXIN) 500 MG tablet Take 500 mg by mouth every 8 (eight) hours as needed for muscle spasms.   Yes [provider]  traMADol (ULTRAM) 50 MG tablet Take 50 mg by mouth every 6 (six) hours as needed (for pain).   Yes [provider]  venlafaxine XR (EFFEXOR-XR) 75 MG 24 hr capsule Take 75 mg by mouth daily. 12/11/16  Yes [provider]  zolpidem (AMBIEN) 5 MG tablet Take 5 mg by mouth at bedtime as needed for sleep. 11/29/20  Yes [provider]    Allergies    Cortisone, Aspirin, Lisinopril, Sulfa antibiotics, and Hydrocodone  Review of Systems   Review of Systems  Constitutional:  Negative for appetite change and  fatigue.  HENT:  Negative for congestion, ear discharge and sinus pressure.   Eyes:  Negative for discharge.  Respiratory:  Positive for shortness of breath. Negative for cough.   Cardiovascular:  Positive for chest pain.  Gastrointestinal:  Negative for abdominal pain and diarrhea.  Genitourinary:  Negative for frequency and hematuria.  Musculoskeletal:  Negative for back pain.  Skin:  Negative for rash.  Neurological:  Negative for seizures and headaches.  Psychiatric/Behavioral:  Negative for hallucinations.    Physical Exam Updated Vital Signs BP (!) 153/94   Pulse  69   Temp 99.2 F (37.3 C) (Oral)   Resp 12   Ht '5\' 4"'$  (1.626 m)   Wt 95.3 kg   SpO2 92%   BMI 36.05 kg/m   Physical Exam Vitals reviewed.  Constitutional:      Appearance: She is well-developed.  HENT:     Head: Normocephalic.     Nose: Nose normal.  Eyes:     General: No scleral icterus.    Conjunctiva/sclera: Conjunctivae normal.  Neck:     Thyroid: No thyromegaly.  Cardiovascular:     Rate and Rhythm: Normal rate and regular rhythm.     Heart sounds: No murmur heard.   No friction rub. No gallop.  Pulmonary:     Breath sounds: No stridor. No wheezing or rales.  Chest:     Chest wall: No tenderness.  Abdominal:     General: There is no distension.     Tenderness: There is no abdominal tenderness. There is no rebound.  Musculoskeletal:        General: Normal range of motion.     Cervical back: Neck supple.     Comments: Tender right calf.  Lymphadenopathy:     Cervical: No cervical adenopathy.  Skin:    Findings: No erythema or rash.  Neurological:     Mental Status: She is alert and oriented to person, place, and time.     Motor: No abnormal muscle tone.     Coordination: Coordination normal.  Psychiatric:        Behavior: Behavior normal.    ED Results / Procedures / Treatments   Labs (all labs ordered are listed, but only abnormal results are displayed) Labs Reviewed   COMPREHENSIVE METABOLIC PANEL - Abnormal; Notable for the following components:      Result Value   Glucose, Bld 104 (*)    BUN 21 (*)    Calcium 8.8 (*)    Total Protein 6.4 (*)    All other components within normal limits  D-DIMER, QUANTITATIVE - Abnormal; Notable for the following components:   D-Dimer, Quant 0.62 (*)    All other components within normal limits  SARS CORONAVIRUS 2 (TAT 6-24 HRS)  CBC WITH DIFFERENTIAL/PLATELET  TROPONIN I (HIGH SENSITIVITY)  TROPONIN I (HIGH SENSITIVITY)    EKG EKG Interpretation  Date/Time:  Thursday December 20 2020 16:36:11 EDT Ventricular Rate:  75 PR Interval:  139 QRS Duration: 100 QT Interval:  389 QTC Calculation: 435 R Axis:   -34 Text Interpretation: Sinus rhythm Left axis deviation Confirmed by Milton Ferguson [phone]) on 12/20/2020 8:55:44 PM  Radiology DG Chest 2 View  Result Date: 12/20/2020 CLINICAL DATA:  Chest pain.  Shortness of breath EXAM: CHEST - 2 VIEW COMPARISON:  Chest x-ray 04/09/2020, CT chest 12/31/2016 FINDINGS: The heart size and mediastinal contours are unchanged. Aortic calcification. No focal consolidation. No pulmonary edema. No pleural effusion. No pneumothorax. No acute osseous abnormality. IMPRESSION: No active cardiopulmonary disease. Electronically Signed   By: Iven Finn M.D.   On: 12/20/2020 17:41   CT Angio Chest PE W and/or Wo Contrast  Result Date: 12/20/2020 CLINICAL DATA:  Chest pain or SOB, pleurisy or effusion suspected. Pt c/o cp and shob that's been on and off since Saturday. Also reports right knee pain that has spread to back of leg and down to ankle. EXAM: CT ANGIOGRAPHY CHEST WITH CONTRAST TECHNIQUE: Multidetector CT imaging of the chest was performed using the standard protocol during bolus administration of intravenous contrast. Multiplanar  CT image reconstructions and MIPs were obtained to evaluate the vascular anatomy. CONTRAST:  128m OMNIPAQUE IOHEXOL 350 MG/ML SOLN COMPARISON:  None.  FINDINGS: Cardiovascular: Satisfactory opacification of the pulmonary arteries to the segmental level. Limited evaluation more distally due to motion artifact. No evidence of pulmonary embolism. The main pulmonary artery measures at the upper limits of normal. Normal heart size. No significant pericardial effusion. The thoracic aorta is normal in caliber. Mild atherosclerotic plaque of the thoracic aorta. At least 1 vessel coronary artery calcifications. Mediastinum/Nodes: No enlarged mediastinal, hilar, or axillary lymph nodes. Thyroid gland, trachea, and esophagus demonstrate no significant findings. Lungs/Pleura: Expiratory phase of respiration with mosaic attenuation of the lungs. No focal consolidation. Few scattered pulmonary micronodules. No pulmonary mass. No pleural effusion. No pneumothorax. Upper Abdomen: Multiple calcified stones within the gallbladder lumen. Gastric sleeve surgical changes. No acute abnormality. Musculoskeletal: No chest wall abnormality. No suspicious lytic or blastic osseous lesions. No acute displaced fracture. Multilevel degenerative changes of the spine. Review of the MIP images confirms the above findings. IMPRESSION: 1. No pulmonary embolus. 2. No acute intrathoracic abnormality. 3. Cholelithiasis. Electronically Signed   By: MIven FinnM.D.   On: 12/20/2020 19:13   VAS [place]OWER EXTREMITY VENOUS (DVT) (ONLY [facility])  Result Date: 12/20/2020  Lower Venous DVT Study Patient Name:  CCHARIAH MUHAMMAD Date of Exam:   12/20/2020 Medical Rec #: [id number]          Accession #:    [id number] of Birth: 1September 16, 1962           Patient Gender: F Patient Age:   019YEx[facility]      VAS [place]OWER EXTREMITY VENOUS (DVT) Referring Phys: JBroadus JohnZAMMIT --------------------------------------------------------------------------------  Indications: Pain, Swelling, and SOB. Other Indications: Patient with RLE knee, calf and ankle pain for past few days                     - negative x-ray '@PCP'$  sent for DVT rule out. Comparison Study: No previous exams Performing Technologist: Jody Hill RVT, RDMS  Examination Guidelines: A complete evaluation includes B-mode imaging, spectral Doppler, color Doppler, and power Doppler as needed of all accessible portions of each vessel. Bilateral testing is considered an integral part of a complete examination. Limited examinations for reoccurring indications may be performed as noted. The reflux portion of the exam is performed with the patient in reverse Trendelenburg.  +---------+---------------+---------+-----------+----------+--------------+ RIGHT    CompressibilityPhasicitySpontaneityPropertiesThrombus Aging +---------+---------------+---------+-----------+----------+--------------+ CFV      Full           Yes      Yes                                 +---------+---------------+---------+-----------+----------+--------------+ SFJ      Full                                                        +---------+---------------+---------+-----------+----------+--------------+ FV Prox  Full           Yes      Yes                                 +---------+---------------+---------+-----------+----------+--------------+  FV Mid   Full           Yes      Yes                                 +---------+---------------+---------+-----------+----------+--------------+ FV DistalFull           Yes      Yes                                 +---------+---------------+---------+-----------+----------+--------------+ PFV      Full                                                        +---------+---------------+---------+-----------+----------+--------------+ POP      Full           Yes      Yes                                 +---------+---------------+---------+-----------+----------+--------------+ PTV      Full                                                         +---------+---------------+---------+-----------+----------+--------------+ PERO     Full                                                        +---------+---------------+---------+-----------+----------+--------------+   +----+---------------+---------+-----------+----------+--------------+ LEFTCompressibilityPhasicitySpontaneityPropertiesThrombus Aging +----+---------------+---------+-----------+----------+--------------+ CFV Full           Yes      Yes                                 +----+---------------+---------+-----------+----------+--------------+    Summary: RIGHT: - There is no evidence of superficial venous thrombosis. - There is no evidence of deep vein thrombosis in the lower extremity.  - No cystic structure found in the popliteal fossa.  LEFT: - No evidence of common femoral vein obstruction.  *See table(s) above for measurements and observations.    Preliminary     Procedures Procedures   Medications Ordered in ED Medications  HYDROmorphone (DILAUDID) injection 0.5 mg (0.5 mg Intravenous Given 12/20/20 1712)  HYDROmorphone (DILAUDID) injection 0.5 mg (0.5 mg Intravenous Given 12/20/20 1939)  iohexol (OMNIPAQUE) 350 MG/ML injection 100 mL (100 mLs Intravenous Contrast Given 12/20/20 1844)    ED Course  I have reviewed the triage vital signs and the nursing notes.  Pertinent labs & imaging results that were available during my care of the patient were reviewed by me and considered in my medical decision making (see chart for details). CT angio negative.  Patient with continued chest discomfort. But 2 normal troponins. MDM Rules/Calculators/A&P  Patient will be admitted to medicine for chest pain and cycling her enzymes  Final Clinical Impression(s) / ED Diagnoses Final diagnoses:  Atypical chest pain  Dyspnea on exertion    Rx / DC Orders ED Discharge Orders     None        Milton Ferguson, MD 12/20/20 2118

## 2020-12-20 NOTE — ED Triage Notes (Signed)
Pt c/o cp and shob that's been on and off since Saturday. Also reports right knee pain that has spread to back of leg and down to ankle. Pt was seen by PCP for knee pain, x-ray negative, so they sent her here to rule out blood clot. Denies N/V.

## 2020-12-21 ENCOUNTER — Other Ambulatory Visit: Payer: Self-pay

## 2020-12-21 ENCOUNTER — Observation Stay (HOSPITAL_BASED_OUTPATIENT_CLINIC_OR_DEPARTMENT_OTHER): Payer: Managed Care, Other (non HMO)

## 2020-12-21 ENCOUNTER — Ambulatory Visit (HOSPITAL_COMMUNITY): Payer: Managed Care, Other (non HMO)

## 2020-12-21 ENCOUNTER — Observation Stay (HOSPITAL_COMMUNITY): Payer: Managed Care, Other (non HMO)

## 2020-12-21 DIAGNOSIS — R079 Chest pain, unspecified: Secondary | ICD-10-CM

## 2020-12-21 DIAGNOSIS — R0609 Other forms of dyspnea: Secondary | ICD-10-CM | POA: Diagnosis present

## 2020-12-21 DIAGNOSIS — R002 Palpitations: Secondary | ICD-10-CM

## 2020-12-21 DIAGNOSIS — M25561 Pain in right knee: Secondary | ICD-10-CM | POA: Diagnosis not present

## 2020-12-21 DIAGNOSIS — I10 Essential (primary) hypertension: Secondary | ICD-10-CM | POA: Diagnosis not present

## 2020-12-21 DIAGNOSIS — M79671 Pain in right foot: Secondary | ICD-10-CM | POA: Diagnosis not present

## 2020-12-21 DIAGNOSIS — Z96641 Presence of right artificial hip joint: Secondary | ICD-10-CM | POA: Diagnosis not present

## 2020-12-21 DIAGNOSIS — R0600 Dyspnea, unspecified: Secondary | ICD-10-CM

## 2020-12-21 DIAGNOSIS — F32A Depression, unspecified: Secondary | ICD-10-CM

## 2020-12-21 DIAGNOSIS — Z79899 Other long term (current) drug therapy: Secondary | ICD-10-CM | POA: Diagnosis not present

## 2020-12-21 DIAGNOSIS — E785 Hyperlipidemia, unspecified: Secondary | ICD-10-CM

## 2020-12-21 DIAGNOSIS — R072 Precordial pain: Secondary | ICD-10-CM | POA: Diagnosis present

## 2020-12-21 DIAGNOSIS — Z20822 Contact with and (suspected) exposure to covid-19: Secondary | ICD-10-CM | POA: Diagnosis not present

## 2020-12-21 DIAGNOSIS — F17210 Nicotine dependence, cigarettes, uncomplicated: Secondary | ICD-10-CM | POA: Diagnosis not present

## 2020-12-21 DIAGNOSIS — F419 Anxiety disorder, unspecified: Secondary | ICD-10-CM

## 2020-12-21 DIAGNOSIS — R7303 Prediabetes: Secondary | ICD-10-CM | POA: Diagnosis not present

## 2020-12-21 LAB — ECHOCARDIOGRAM COMPLETE
Area-P 1/2: 2.55 cm2
Calc EF: 70.6 %
Height: 64 in
S' Lateral: 3.5 cm
Single Plane A2C EF: 67.4 %
Single Plane A4C EF: 73.1 %
Weight: 3360 oz

## 2020-12-21 LAB — SARS CORONAVIRUS 2 (TAT 6-24 HRS): SARS Coronavirus 2: NEGATIVE

## 2020-12-21 LAB — HEMOGLOBIN A1C
Hgb A1c MFr Bld: 5.8 % — ABNORMAL HIGH (ref 4.8–5.6)
Mean Plasma Glucose: 119.76 mg/dL

## 2020-12-21 LAB — BASIC METABOLIC PANEL
Anion gap: 5 (ref 5–15)
BUN: 20 mg/dL (ref 6–20)
CO2: 26 mmol/L (ref 22–32)
Calcium: 8.7 mg/dL — ABNORMAL LOW (ref 8.9–10.3)
Chloride: 108 mmol/L (ref 98–111)
Creatinine, Ser: 0.8 mg/dL (ref 0.44–1.00)
GFR, Estimated: >60 mL/min (ref >60)
Glucose, Bld: 104 mg/dL — ABNORMAL HIGH (ref 70–99)
Potassium: 4.1 mmol/L (ref 3.5–5.1)
Sodium: 139 mmol/L (ref 135–145)

## 2020-12-21 LAB — CBC
HCT: 41 % (ref 36.0–46.0)
Hemoglobin: 12.6 g/dL (ref 12.0–15.0)
MCH: 27.7 pg (ref 26.0–34.0)
MCHC: 30.7 g/dL (ref 30.0–36.0)
MCV: 90.1 fL (ref 80.0–100.0)
Platelets: 259 10*3/uL (ref 150–400)
RBC: 4.55 MIL/uL (ref 3.87–5.11)
RDW: 13.9 % (ref 11.5–15.5)
WBC: 6.8 10*3/uL (ref 4.0–10.5)
nRBC: 0 % (ref 0.0–0.2)

## 2020-12-21 LAB — LIPID PANEL
Cholesterol: 168 mg/dL (ref 0–200)
HDL: 45 mg/dL (ref >40)
LDL Cholesterol: 101 mg/dL — ABNORMAL HIGH (ref 0–99)
Total CHOL/HDL Ratio: 3.7 RATIO
Triglycerides: 108 mg/dL (ref <150)
VLDL: 22 mg/dL (ref 0–40)

## 2020-12-21 LAB — HIV ANTIBODY (ROUTINE TESTING W REFLEX): HIV Screen 4th Generation wRfx: NONREACTIVE

## 2020-12-21 LAB — CREATININE, SERUM
Creatinine, Ser: 0.82 mg/dL (ref 0.44–1.00)
GFR, Estimated: >60 mL/min (ref >60)

## 2020-12-21 LAB — TSH: TSH: 3.917 u[IU]/mL (ref 0.350–4.500)

## 2020-12-21 LAB — URIC ACID: Uric Acid, Serum: 5.3 mg/dL (ref 2.5–7.1)

## 2020-12-21 MED ORDER — LIDOCAINE VISCOUS HCL 2 % MT SOLN
15.0000 mL | Freq: Four times a day (QID) | OROMUCOSAL | Status: DC | PRN
Start: 2020-12-21 — End: 2020-12-21

## 2020-12-21 MED ORDER — IOHEXOL 350 MG/ML SOLN
100.0000 mL | Freq: Once | INTRAVENOUS | Status: AC | PRN
  Administered 2020-12-21: 100 mL via INTRAVENOUS

## 2020-12-21 MED ORDER — ALUM & MAG HYDROXIDE-SIMETH 200-200-20 MG/5ML PO SUSP: 30.0000 mL | Freq: Four times a day (QID) | ORAL | Status: DC | PRN

## 2020-12-21 MED ORDER — ISOSORBIDE MONONITRATE ER 30 MG PO TB24: 15.0000 mg | ORAL_TABLET | Freq: Every day | ORAL | 0 refills | Status: DC

## 2020-12-21 MED ORDER — PERFLUTREN LIPID MICROSPHERE
1.0000 mL | INTRAVENOUS | Status: AC | PRN
  Administered 2020-12-21: 2 mL via INTRAVENOUS

## 2020-12-21 NOTE — ED Notes (Signed)
Echo at bedside

## 2020-12-21 NOTE — Consult Note (Addendum)
Cardiology Consultation:   Patient ID: [name] MRN: [id number]; DOB: 08-21-1960  Admit date: 12/20/2020 Date of Consult: 12/21/2020  PCP:  [name], [facility] Cardiologist:  New - Dr. Margaretann Loveless (Previously seen by Dr. Mare Ferrari in 2015)  Patient Profile:   [name] is a 60 y.o. female with a history of hypertension, hyperlipidemia, hyperparathyroidism, GERD, obstructive sleep apnea, anxiety/depression, and current tobacco abuse who is being seen the evaluation of chest pain at the request of Dr. Hal Hope.   History of Present Illness:   [name] is a 59 year old female with the above history. Patient was  admitted in 2015 for chest pain during an argument and was seen by Dr. Mare Ferrari. EKG and troponin were unremarkable but underwent Lexiscan Myoview given multiple CV risk factors which was negative for ischemia. She has not been seen by Cardiology since that time. Last Echo in 2017 showed LVEF of 60-65% with normal wall motion.   Patient presented to the [facility] ED on 12/19/2020 for further evaluation of chest pain and leg pain. Patient report intermittent chest pain at rest over the last 2 weeks. She had a severe episode of pain on Saturday 12/08/2020 with associated nausea and diaphoresis. Lasted about 30 minutes and resolved after she laid down. She had another episode of similar pain this past Saturday 12/15/2020. She ranked the pain as 10/10. Again it lasted for about 30 minutes and resolves once she laid down. She states she has had multiple episodes of chest pain over the last couple weeks but they have not been as severe. All occur at rest. Does not sound like she has had any exertional pain. She does have a history of GERD but states this feels different than her usual heart burn. She describes it as a heaviness. She also reports exertional dyspnea over the last 1.5 weeks. She was diagnosed with COVID in 01/2020 and had some exertional dyspnea  following this but it improved. However, it has come back over the last 1.5 weeks. She also describes orthopnea since having COVID. She has sleep apnea listed in her chart but she denies this. She states she has had a sleep study before and it was negative. She has also had right leg pain for the last 2 weeks. It started with right knee pain about 2 weeks again. She was seen by her PCP and diagnosed with bursitis and treated with prednisone. Then, this past Monday 12/17/2020, she started having lower right left/foot pain. She was seen by her PCP again yesterday and reportedly had x-rays which were normal. She was advised to go to the ED to rule out DVT. She also report intermittent palpitations with some lightheadedness/dizziness but no syncope. Patient reports feeling very fatigued for a while now and just wants to sleep all day. She also reports weight gain. No recent fevers or illness. No cough or nasal congestion. No vomiting or abdominal pain. No abnormal bleeding in urine or stools.  In the ED, patient mildly hypertensive but BP stable. EKG showed normal sinus rhythm with no acute ischemic changes.. High-sensitivity troponin negative x2. Chest x-ray showed no acute findings. D-dimer elevated. Chest CTA negative for PE but did show multiple calcified stones within the gallbladder and at least 1 vessel coronary artery calcifications. CBC normal. Na 141, K 4.0, Glucose 104, BUN 21, Cr 0.98. LFTs normal. Patient admitted for further evaluation.  At the time of this evaluation, patient resting comfortably in bed. She is  starting to have some heart burn (has known GERD) but no current chest heaviness. Main complaint at this time is right leg/ankle pain.  Patient has a history of hyperlipidemia and obstructive sleep apnea listed in chart but patient denies both of these. No known history of diabetes.She has a 43 year smoking history and currently smokes 1/2 pack per day. She also has a family history of heart  disease. Her father had a MI at the age of 67 and also had CHF. Her sister just recently had a MI at the age of 31.  Past Medical History:  Diagnosis Date   Anxiety    Depression    Fatty liver    History of kidney stones    Hyperlipidemia    Hyperparathyroidism ([place])    Hypertension    Nephrolithiasis    Obesity    OSA (obstructive sleep apnea) 03/09/2013   denies; does not use CPAP    Pre-diabetes    last A1C in 2015 5.9% see 01-13-14 lab epic    Sleep apnea    Vitamin D deficiency    Wound, breast    superficial appearing , reddened area to left upper breast; per patient, has been in sun recently , denies pain or drainage , "just itchy"     Past Surgical History:  Procedure Laterality Date   BACK SURGERY     BREAST LUMPECTOMY WITH RADIOACTIVE SEED LOCALIZATION Right 02/05/2018   Procedure: BREAST LUMPECTOMY WITH RADIOACTIVE SEED LOCALIZATION;  Surgeon: Alphonsa Overall, MD;  Location: [place];  Service: General;  Laterality: Right;   BREATH TEK H PYLORI N/A 08/31/2014   Procedure: [place];  Surgeon: Alphonsa Overall, MD;  Location: [facility];  Service: General;  Laterality: N/A;   CYSTOSCOPY WITH STENT PLACEMENT Right 03/21/2020   Procedure: CYSTOSCOPY WITH STENT PLACEMENT;  Surgeon: Raynelle Bring, MD;  Location: [facility];  Service: Urology;  Laterality: Right;   CYSTOSCOPY/URETEROSCOPY/HOLMIUM LASER/STENT PLACEMENT Right 04/09/2020   Procedure: CYSTOSCOPY/URETEROSCOPYWITH STONE REMOVAL;  Surgeon: Raynelle Bring, MD;  Location: [facility];  Service: Urology;  Laterality: Right;   LAPAROSCOPIC GASTRIC SLEEVE RESECTION N/A 02/20/2015   Procedure: LAPAROSCOPIC GASTRIC SLEEVE RESECTION;  Surgeon: Alphonsa Overall, MD;  Location: [facility];  Service: General;  Laterality: N/A;   PARTIAL HYSTERECTOMY     sleep study  04/08/13   THYROID SURGERY     TOTAL HIP ARTHROPLASTY Right 02/27/2017   Procedure: RIGHT TOTAL HIP ARTHROPLASTY ANTERIOR APPROACH;  Surgeon: Dorna Leitz, MD;  Location: [facility];  Service: Orthopedics;  Laterality: Right;  Panel Lenght: 120 mins     Home Medications:  Prior to Admission medications   Medication Sig Start Date End Date Taking? Authorizing Provider  amLODipine (NORVASC) 5 MG tablet Take 5 mg by mouth daily.   Yes [provider]  busPIRone (BUSPAR) 7.5 MG tablet Take 7.5 mg by mouth in the morning.   Yes [provider]  Cholecalciferol (VITAMIN D-3) 5000 UNITS TABS Take 5,000 Units by mouth every morning.    Yes [provider]  esomeprazole (NEXIUM) 20 MG capsule Take 20 mg by mouth at bedtime.   Yes [provider]  ibuprofen (ADVIL) 200 MG tablet Take 400 mg by mouth every 8 (eight) hours as needed (for pain).   Yes [provider]  methocarbamol (ROBAXIN) 500 MG tablet Take 500 mg by mouth every 8 (eight) hours as needed for muscle spasms.   Yes [provider]  traMADol (ULTRAM) 50 MG tablet Take  50 mg by mouth every 6 (six) hours as needed (for pain).   Yes [provider]  venlafaxine XR (EFFEXOR-XR) 75 MG 24 hr capsule Take 75 mg by mouth in the morning. 12/11/16  Yes [provider]  zolpidem (AMBIEN) 5 MG tablet Take 5 mg by mouth at bedtime as needed for sleep. 11/29/20  Yes [provider]    Inpatient Medications: Scheduled Meds:  amLODipine  5 mg Oral Daily   busPIRone  7.5 mg Oral Daily   enoxaparin (LOVENOX) injection  40 mg Subcutaneous Q24H   pantoprazole  40 mg Oral Daily   venlafaxine XR  75 mg Oral Daily   Continuous Infusions:  PRN Meds: methocarbamol, zolpidem  Allergies:    Allergies  Allergen Reactions   Cortisone Swelling and Rash   Aspirin Other (See Comments)    "Upsets my stomach"   Lisinopril Cough   Hydrocodone Itching   Sulfa Antibiotics Rash    Social History:   Social History   Socioeconomic History   Marital status: Married    Spouse name: joseph   Number of children: 3   Years of education: 12   Highest  education level: Not on file  Occupational History   Occupation: [employer]  Tobacco Use   Smoking status: Every Day    Packs/day: 0.25    Years: 20.00    Pack years: 5.00    Types: Cigarettes   Smokeless tobacco: Never  Vaping Use   Vaping Use: Never used  Substance and Sexual Activity   Alcohol use: No    Alcohol/week: 0.0 standard drinks   Drug use: No   Sexual activity: Never  Other Topics Concern   Not on file  Social History Narrative   Not on file   Social Determinants of Health   Financial Resource Strain: Not on file  Food Insecurity: Not on file  Transportation Needs: Not on file  Physical Activity: Not on file  Stress: Not on file  Social Connections: Not on file  Intimate Partner Violence: Not on file    Family History:   Family History  Problem Relation Age of Onset   COPD Mother    Hypertension Mother    Heart disease Mother    Heart attack Mother    Heart attack Father        died age 62 with the heart attack   Heart disease Father    Hypertension Sister    Hypertension Brother    Hypertension Sister    Other Son        died age 17, autopsy pending possible aneurysm   Colon cancer Neg Hx    Breast cancer Neg Hx      ROS:  Please see the history of present illness.  Review of Systems  Constitutional:  Positive for malaise/fatigue. Negative for fever.  HENT:  Negative for congestion.   Respiratory:  Positive for shortness of breath. Negative for cough.   Cardiovascular:  Positive for chest pain, palpitations, orthopnea and leg swelling. Negative for PND.  Gastrointestinal:  Positive for nausea. Negative for abdominal pain, blood in stool, melena and vomiting.  Genitourinary:  Negative for hematuria. Urgency: right leg swelling. Musculoskeletal:  Positive for joint pain.  Neurological:  Positive for dizziness. Negative for loss of consciousness.  Endo/Heme/Allergies:  Does not bruise/bleed easily.  Psychiatric/Behavioral:  Positive  for substance abuse (tobacco abuse).    Physical Exam/Data:   Vitals:   12/21/20 0500 12/21/20 0600 12/21/20 0700 12/21/20  0903  BP: 115/73 129/80 122/78 135/87  Pulse: (!) 49 (!) 49 (!) 49   Resp: '12 11 11   '$ Temp:      TempSrc:      SpO2: 96% 93% 93%   Weight:      Height:       No intake or output data in the 24 hours ending 12/21/20 0909 Last 3 Weights 12/20/2020 04/09/2020 04/04/2020  Weight (lbs) 210 lb 212 lb 212 lb  Weight (kg) 95.255 kg 96.163 kg 96.163 kg     Body mass index is 36.05 kg/m.  General: 60 y.o. female resting comfortably in no acute distress.  HEENT: Normocephalic and atraumatic. Sclera clear.  Neck: Supple. No carotid bruits. No JVD. Heart: Mildly bradycardic. Distinct S1 and S2. Soft I-II/VI systolic murmur. No gallops or rubs. Radial and distal pedal pulses 2+ and equal bilaterally. Lungs: No increased work of breathing. Clear to ausculation bilaterally. No wheezes, rhonchi, or rales.  Abdomen: Soft, non-distended, and non-tender to palpation.  MSK: Normal strength and tone for age. Extremities: No lower extremity edema.    Skin: Warm and dry. Neuro: Alert and oriented x3. No focal deficits. Psych: Normal affect. Responds appropriately.   EKG:  The EKG was personally reviewed and demonstrates: Normal sinus rhythm, rate 75 bpm, with slight ST depression in lead II (<0.5 mm) but no acute ischemic changes. Left axis deviation. Normal PR and QRS intervals. Qtc 435 ms.  Telemetry:  Telemetry was personally reviewed and demonstrates: Normal sinus rhythm with rates in the 50s to 60s. Multiple PACs noted.  Relevant CV Studies:  Myoview 01/13/2014: Impressions: 1. No reversible ischemia or infarction.  2. Normal left ventricular wall motion.  3. Left ventricular ejection fraction 77%  4. Low-risk stress test findings*. _______________  Echocardiogram 12/20/2015: Study Conclusions: - Left ventricle: The cavity size was normal. Wall thickness was     normal. Systolic function was normal. The estimated ejection    fraction was in the range of 60% to 65%. Wall motion was normal;    there were no regional wall motion abnormalities. There was no    evidence of elevated ventricular filling pressure by Doppler    parameters.   Laboratory Data:  High Sensitivity Troponin:   Recent Labs  Lab 12/20/20 1646 12/20/20 1847  TROPONINIHS 5 6     Chemistry Recent Labs  Lab 12/20/20 1646 12/20/20 2346 12/21/20 0356  NA 141  --  139  K 4.0  --  4.1  CL 109  --  108  CO2 23  --  26  GLUCOSE 104*  --  104*  BUN 21*  --  20  CREATININE 0.98 0.82 0.80  CALCIUM 8.8*  --  8.7*  GFRNONAA >60 >60 >60  ANIONGAP 9  --  5    Recent Labs  Lab 12/20/20 1646  PROT 6.4*  ALBUMIN 3.8  AST 17  ALT 16  ALKPHOS 102  BILITOT 0.6   Hematology Recent Labs  Lab 12/20/20 1646 12/20/20 2346 12/21/20 0356  WBC 7.7 8.1 6.8  RBC 4.69 4.75 4.55  HGB 13.0 13.2 12.6  HCT 41.5 42.7 41.0  MCV 88.5 89.9 90.1  MCH 27.7 27.8 27.7  MCHC 31.3 30.9 30.7  RDW 14.1 13.9 13.9  PLT 265 290 259   BNPNo results for input(s): BNP, PROBNP in the last 168 hours.  DDimer  Recent Labs  Lab 12/20/20 1646  DDIMER 0.62*     Radiology/Studies:  DG Chest 2 View  Result Date: 12/20/2020 CLINICAL DATA:  Chest pain.  Shortness of breath EXAM: CHEST - 2 VIEW COMPARISON:  Chest x-ray 04/09/2020, CT chest 12/31/2016 FINDINGS: The heart size and mediastinal contours are unchanged. Aortic calcification. No focal consolidation. No pulmonary edema. No pleural effusion. No pneumothorax. No acute osseous abnormality. IMPRESSION: No active cardiopulmonary disease. Electronically Signed   By: Iven Finn M.D.   On: 12/20/2020 17:41   CT Angio Chest PE W and/or Wo Contrast  Result Date: 12/20/2020 CLINICAL DATA:  Chest pain or SOB, pleurisy or effusion suspected. Pt c/o cp and shob that's been on and off since Saturday. Also reports right knee pain that has spread to back  of leg and down to ankle. EXAM: CT ANGIOGRAPHY CHEST WITH CONTRAST TECHNIQUE: Multidetector CT imaging of the chest was performed using the standard protocol during bolus administration of intravenous contrast. Multiplanar CT image reconstructions and MIPs were obtained to evaluate the vascular anatomy. CONTRAST:  12m OMNIPAQUE IOHEXOL 350 MG/ML SOLN COMPARISON:  None. FINDINGS: Cardiovascular: Satisfactory opacification of the pulmonary arteries to the segmental level. Limited evaluation more distally due to motion artifact. No evidence of pulmonary embolism. The main pulmonary artery measures at the upper limits of normal. Normal heart size. No significant pericardial effusion. The thoracic aorta is normal in caliber. Mild atherosclerotic plaque of the thoracic aorta. At least 1 vessel coronary artery calcifications. Mediastinum/Nodes: No enlarged mediastinal, hilar, or axillary lymph nodes. Thyroid gland, trachea, and esophagus demonstrate no significant findings. Lungs/Pleura: Expiratory phase of respiration with mosaic attenuation of the lungs. No focal consolidation. Few scattered pulmonary micronodules. No pulmonary mass. No pleural effusion. No pneumothorax. Upper Abdomen: Multiple calcified stones within the gallbladder lumen. Gastric sleeve surgical changes. No acute abnormality. Musculoskeletal: No chest wall abnormality. No suspicious lytic or blastic osseous lesions. No acute displaced fracture. Multilevel degenerative changes of the spine. Review of the MIP images confirms the above findings. IMPRESSION: 1. No pulmonary embolus. 2. No acute intrathoracic abnormality. 3. Cholelithiasis. Electronically Signed   By: MIven FinnM.D.   On: 12/20/2020 19:13   DG Foot 2 Views Right  Result Date: 12/21/2020 CLINICAL DATA:  Right foot pain. No known injury. EXAM: RIGHT FOOT - 2 VIEW COMPARISON:  None. FINDINGS: There is no acute fracture or dislocation. Mild osteopenia. There is mild degenerative  changes of the tarsal metatarsal joint with spurring. The soft tissues are unremarkable. IMPRESSION: No acute fracture or dislocation. Electronically Signed   By: AAnner CreteM.D.   On: 12/21/2020 02:47   VAS [place]LOWER EXTREMITY VENOUS (DVT) (ONLY [facility])  Result Date: 12/20/2020  Lower Venous DVT Study Patient Name:  [name] Date of Exam:   12/20/2020 Medical Rec #: [id number]          Accession #:    [id number] of Birth: 1Apr 05, 1962           Patient Gender: F Patient Age:   089YEx[facility]      VAS [place]OWER EXTREMITY VENOUS (DVT) Referring Phys: JBroadus JohnZAMMIT --------------------------------------------------------------------------------  Indications: Pain, Swelling, and SOB. Other Indications: Patient with RLE knee, calf and ankle pain for past few days                    - negative x-ray '@PCP'$  sent for DVT rule out. Comparison Study: No previous exams Performing Technologist: Jody Hill RVT, RDMS  Examination Guidelines: A complete evaluation includes B-mode imaging, spectral  Doppler, color Doppler, and power Doppler as needed of all accessible portions of each vessel. Bilateral testing is considered an integral part of a complete examination. Limited examinations for reoccurring indications may be performed as noted. The reflux portion of the exam is performed with the patient in reverse Trendelenburg.  +---------+---------------+---------+-----------+----------+--------------+ RIGHT    CompressibilityPhasicitySpontaneityPropertiesThrombus Aging +---------+---------------+---------+-----------+----------+--------------+ CFV      Full           Yes      Yes                                 +---------+---------------+---------+-----------+----------+--------------+ SFJ      Full                                                        +---------+---------------+---------+-----------+----------+--------------+ FV Prox  Full            Yes      Yes                                 +---------+---------------+---------+-----------+----------+--------------+ FV Mid   Full           Yes      Yes                                 +---------+---------------+---------+-----------+----------+--------------+ FV DistalFull           Yes      Yes                                 +---------+---------------+---------+-----------+----------+--------------+ PFV      Full                                                        +---------+---------------+---------+-----------+----------+--------------+ POP      Full           Yes      Yes                                 +---------+---------------+---------+-----------+----------+--------------+ PTV      Full                                                        +---------+---------------+---------+-----------+----------+--------------+ PERO     Full                                                        +---------+---------------+---------+-----------+----------+--------------+   +----+---------------+---------+-----------+----------+--------------+ LEFTCompressibilityPhasicitySpontaneityPropertiesThrombus Aging +----+---------------+---------+-----------+----------+--------------+ CFV Full  Yes      Yes                                 +----+---------------+---------+-----------+----------+--------------+    Summary: RIGHT: - There is no evidence of superficial venous thrombosis. - There is no evidence of deep vein thrombosis in the lower extremity.  - No cystic structure found in the popliteal fossa.  LEFT: - No evidence of common femoral vein obstruction.  *See table(s) above for measurements and observations.    Preliminary      Assessment and Plan:   Chest Pain Exertional Dyspnea - Patient presented with intermittent chest pain at rest over the last 2 weeks. She had 2 severe episodes with associated nausea and dizziness. No exertional  chest pain but she does report exertional dyspnea over the last 1.5 weeks. - EKG shows no acute ischemic changes.  - High-sensitivity troponin negative x2.  - Chest CTA negative for PE but did show at least 1 vessel coronary artery calcifications.  - Will order Echo. - Chest pain sounds somewhat atypical but she has multiple CV risk factors. Discussed with MD - will plan for coronary CTA for further evaluation.   Hypertension - BP mildly elevated at times. Currently well controlled this morning. - Continue Amlodipine '5mg'$  daily.  Hyperlipidemia - Patient has history of hyperlipidemia listed in chart but denies this.  - Will check fasting lipid panel.  Fatigue - Patient reports feeling very fatigued and just wanted to sleep all day for a while. Also notes weight gain. - Will check TSH.  Palpitations - Patient notes intermittent palpitations with some associated lightheadedness/dizziness. - Telemetry shows sinus rhythm with rates in the 50s to 60s and multiple PACs.  - Will continue to monitor on telemetry while here.   Right Leg/Foot Pain - Lower extremity doppler negative for DVT. - Recently treated for bursitis of knee with prednisone. - Management per primary team.   Risk Assessment/Risk Scores:   HEAR Score (for undifferentiated chest pain):  HEAR Score: 4{  For questions or updates, please contact [place] Please consult [email or web address] for contact info under    Signed, Darreld Mclean, PA-C  12/21/2020 9:09 AM  Patient seen and examined with Sande Rives, PA-C.  Agree as above, with the following exceptions and changes as noted below.  [name] is a 60 year old female with a past medical history of hypertension, hyperlipidemia, hyperparathyroidism, GERD, OSA, anxiety and depression, and current tobacco use who presents with chest pain, most consistent with cardiac chest pain.  She has had intermittent chest pain at rest over the last 2 weeks which feels  different than her typical GERD symptoms which have been quite bothersome over the last couple of years.  I have independently reviewed her CT PE study performed due to concerns of pulmonary embolism, she has evidence of coronary artery calcifications in the mid LAD at the bifurcation of the diagonal, and also has evidence of air in the esophagus which may be indicative of active GERD and may be contributing to symptoms.  She had COVID at the end of last year and notes some exertional dyspnea following that illness.  High-sensitivity troponin negative x2, D-dimer elevated, laboratory studies grossly unremarkable.   Gen: NAD, CV: RRR, no murmurs, Lungs: clear, Abd: soft, Extrem: Warm, well perfused, no edema, Neuro/Psych: alert and oriented x 3, normal mood and affect. All available labs, radiology testing, previous records reviewed.  Patient be a good candidate for coronary CTA, we will transfer her to [facility] for this scan.  If no obstructive CAD, we may want to titrate antianginal therapy with a medication like Imdur, she is already on amlodipine.  If obstructive CAD is noted with hemodynamic significance, she may need to remain in hospital for cardiac catheterization.  Disposition pending results of CCTA.  Remainder as above.  Elouise Munroe, MD 12/21/20 12:05 PM

## 2020-12-21 NOTE — Discharge Summary (Signed)
Physician Discharge Summary  [name] [id number] DOB: 07-Oct-1960 DOA: 12/20/2020  PCP: [name], DO  Admit date: 12/20/2020 Discharge date: 12/21/2020  Time spent: 50 minutes  Recommendations for Outpatient Follow-up:  Follow-up with Dr. Margaretann Loveless, cardiology on 01/11/2021. Follow-up with [name], DO in 1 to 2 weeks.  On follow-up noncardiac causes for chest pain and shortness of breath will need to be evaluated in the outpatient setting.   Discharge Diagnoses:  Principal Problem:   Chest pain Active Problems:   Dyspnea on exertion   OSA (obstructive sleep apnea)   HTN (hypertension)   GERD (gastroesophageal reflux disease)   Anxiety   Right foot pain   Hyperlipidemia   Depression   Discharge Condition: Stable  Diet recommendation: Heart healthy  Filed Weights   12/20/20 1634  Weight: 95.3 kg    History of present illness:  HPI per Dr. Marya Amsler [name] is a 60 y.o. female with history of hypertension, depression presents to the ER because of worsening right foot pain and also has been complaining of chest pain with shortness of breath.  Patient states about a week ago patient developed right knee pain for which her orthopedic surgeon placed her on prednisone.  Over the last 4 days patient also started allopurinol right foot pain.  Had gone to her orthopedic surgeon again and was instructed to come to the ER because to rule out DVT.  Patient states that he has been in some substernal pressure about 6 days ago which was present even at rest.  Has been having off-and-on shortness of breath since her COVID last year.   ED Course: In the ER patient was chest pain-free high sensitive troponins were negative EKG was unremarkable.  Labs are largely unremarkable COVID test negative.  CT angiogram was negative for PE Dopplers were negative for DVT.  Patient still complains of substantial right foot pain.  On exam patient's ankle does not show any obvious  swelling.  Pulses are good.  Patient admitted for further management of right foot pain and chest pain.  Hospital Course:  #1 chest pain and shortness of breath -Patient presented with chest pain with exertional shortness of breath. -High-sensitivity troponins negative. -CT angiogram chest negative for PE or intrathoracic abnormality. -2D echo obtained with a EF of 65 to 70%, NWMA, grade 1 diastolic dysfunction. -Due to multiple cardiac risk factors cardiology consulted who assessed the patient and recommended coronary CTA and as such patient subsequently transferred to [facility] and underwent coronary CTA which showed minimal CAD, coronary calcium score of 5.  -Cardiology recommended trial of Imdur 15 mg daily with close outpatient follow-up with cardiology and to consider noncardiac causes for chest pain.   -Patient was discharged in stable condition and is to follow-up with PCP and cardiology in the outpatient setting.    2.  Right foot pain with recent right knee pain -Uric acid levels noted at 5.3. -Lower extremity Dopplers negative for DVT. -Pain films of the right foot negative for any acute abnormalities. -Outpatient follow-up with orthopedics and PCP.  3.  Hypertension -Patient maintained on home regimen Norvasc.  4.  Depression/anxiety -Remained stable.  Patient maintained on home regimen BuSpar and venlafaxine.    5.  Cholelithiasis noted on CT scan -Asymptomatic. -Outpatient follow-up.  6.  History of hyperlipidemia -Fasting lipid panel with LDL of 101. -Outpatient follow-up with PCP  7.  GERD -Patient maintained on PPI.  Procedures: CT angiogram chest 12/20/2020 Chest x-ray 12/20/2020 Plain  films of the right foot 12/21/2020 2D echo 12/21/2020 Cardiac/coronary CT 12/21/2020 Lower extremity Doppler 12/20/2020  Consultations: Cardiology: Dr. Margaretann Loveless 12/21/2020  Discharge Exam: Vitals:   12/21/20 1600 12/21/20 1719  BP: 128/84 135/80  Pulse: (!) 53 (!) 53  Resp: 13 15   Temp:    SpO2: 92% 97%    General: NAD Cardiovascular: RRR no murmurs rubs or gallops.  No JVD.  No lower extremity edema. Respiratory: Clear to auscultation bilaterally.  No wheezes, no crackles, no rhonchi.  Normal respiratory effort  Discharge Instructions   Discharge Instructions     Diet - low sodium heart healthy   Complete by: As directed    Increase activity slowly   Complete by: As directed       Allergies as of 12/21/2020       Reactions   Cortisone Swelling, Rash   Aspirin Other (See Comments)   "Upsets my stomach"   Lisinopril Cough   Hydrocodone Itching   Sulfa Antibiotics Rash        Medication List     STOP taking these medications    ibuprofen 200 MG tablet Commonly known as: ADVIL       TAKE these medications    amLODipine 5 MG tablet Commonly known as: NORVASC Take 5 mg by mouth daily.   busPIRone 7.5 MG tablet Commonly known as: BUSPAR Take 7.5 mg by mouth in the morning.   esomeprazole 20 MG capsule Commonly known as: NEXIUM Take 20 mg by mouth at bedtime.   isosorbide mononitrate 30 MG 24 hr tablet Commonly known as: IMDUR Take 0.5 tablets (15 mg total) by mouth daily.   methocarbamol 500 MG tablet Commonly known as: ROBAXIN Take 500 mg by mouth every 8 (eight) hours as needed for muscle spasms.   traMADol 50 MG tablet Commonly known as: ULTRAM Take 50 mg by mouth every 6 (six) hours as needed (for pain).   venlafaxine XR 75 MG 24 hr capsule Commonly known as: EFFEXOR-XR Take 75 mg by mouth in the morning.   Vitamin D-3 125 MCG (5000 UT) Tabs Take 5,000 Units by mouth every morning.   zolpidem 5 MG tablet Commonly known as: AMBIEN Take 5 mg by mouth at bedtime as needed for sleep.       Allergies  Allergen Reactions   Cortisone Swelling and Rash   Aspirin Other (See Comments)    "Upsets my stomach"   Lisinopril Cough   Hydrocodone Itching   Sulfa Antibiotics Rash    Follow-up Information     Elouise Munroe, MD Follow up.   Specialties: Cardiology, Radiology Why: Hospital follow-up scheduled for 01/11/2021 at 8:20am. Please arrive 15 minutes early for check-in. If this date/time does not work for you, please call our office to reschedule. Contact information: [address] [place] [address] [phone]         [name], DO. Schedule an appointment as soon as possible for a visit in 2 week(s).   Specialty: Family Medicine Why: f/u in 1-2 weeks Contact information: [address] [place] [address] [phone]                  The results of significant diagnostics from this hospitalization (including imaging, microbiology, ancillary and laboratory) are listed below for reference.    Significant Diagnostic Studies: DG Chest 2 View  Result Date: 12/20/2020 CLINICAL DATA:  Chest pain.  Shortness of breath EXAM: CHEST - 2 VIEW COMPARISON:  Chest x-ray 04/09/2020, CT chest 12/31/2016 FINDINGS: The heart size and mediastinal contours are unchanged. Aortic calcification. No focal consolidation. No pulmonary edema. No pleural effusion. No pneumothorax. No acute osseous abnormality. IMPRESSION: No active cardiopulmonary disease. Electronically Signed   By: Iven Finn M.D.   On: 12/20/2020 17:41   CT Angio Chest PE W and/or Wo Contrast  Result Date: 12/20/2020 CLINICAL DATA:  Chest pain or SOB, pleurisy or effusion suspected. Pt c/o cp and shob that's been on and off since Saturday. Also reports right knee pain that has spread to back of leg and down to ankle. EXAM: CT ANGIOGRAPHY CHEST WITH CONTRAST TECHNIQUE: Multidetector CT imaging of the chest was performed using the standard protocol during bolus administration of intravenous contrast. Multiplanar CT image reconstructions and MIPs were obtained to evaluate the vascular anatomy. CONTRAST:  121m OMNIPAQUE IOHEXOL 350 MG/ML SOLN COMPARISON:  None. FINDINGS: Cardiovascular: Satisfactory  opacification of the pulmonary arteries to the segmental level. Limited evaluation more distally due to motion artifact. No evidence of pulmonary embolism. The main pulmonary artery measures at the upper limits of normal. Normal heart size. No significant pericardial effusion. The thoracic aorta is normal in caliber. Mild atherosclerotic plaque of the thoracic aorta. At least 1 vessel coronary artery calcifications. Mediastinum/Nodes: No enlarged mediastinal, hilar, or axillary lymph nodes. Thyroid gland, trachea, and esophagus demonstrate no significant findings. Lungs/Pleura: Expiratory phase of respiration with mosaic attenuation of the lungs. No focal consolidation. Few scattered pulmonary micronodules. No pulmonary mass. No pleural effusion. No pneumothorax. Upper Abdomen: Multiple calcified stones within the gallbladder lumen. Gastric sleeve surgical changes. No acute abnormality. Musculoskeletal: No chest wall abnormality. No suspicious lytic or blastic osseous lesions. No acute displaced fracture. Multilevel degenerative changes of the spine. Review of the MIP images confirms the above findings. IMPRESSION: 1. No pulmonary embolus. 2. No acute intrathoracic abnormality. 3. Cholelithiasis. Electronically Signed   By: MIven FinnM.D.   On: 12/20/2020 19:13   CT CORONARY MORPH W/CTA COR W/SCORE W/CA W/CM &/OR WO/CM  Addendum Date: 12/21/2020   ADDENDUM REPORT: 12/21/2020 13:47 HISTORY: Chest pain/anginal equiv, ECGs and troponins normal EXAM: Cardiac/Coronary  CT TECHNIQUE: The patient was scanned on a [employer] PROTOCOL: A 120 kV prospective scan was triggered in the descending thoracic aorta at 111 HU's. Axial non-contrast 3 mm slices were carried out through the heart. The data set was analyzed on a dedicated work station and scored using the Agatston method. Gantry rotation speed was 250 msecs and collimation was .6 mm. Beta blockade and 0.8 mg of sl NTG was given. The 3D data set was  reconstructed in 5% intervals of the 35-75 % of the R-R cycle. Systolic and diastolic phases were analyzed on a dedicated work station using MPR, MIP and VRT modes. The patient received 1066mOMNIPAQUE IOHEXOL 350 MG/ML SOLN contrast. FINDINGS: Image quality: good Noise artifact is: Mild misregistration. Coronary calcium score is 5, which places the patient in the 70th percentile for age and sex matched control. Coronary arteries: Normal coronary origins.  Probable co-dominance. Right Coronary Artery: No detectable plaque or stenosis. Left Main Coronary Artery: No detectable plaque or stenosis. Left Anterior Descending Coronary Artery: Minimal mixed atherosclerotic plaque in the mid LAD (<25% stenosis) at the bifurcation of the first diagonal branch. There is also minimal mixed atherosclerotic plaque in the proximal first diagonal artery (<25% stenosis). Left Circumflex Artery: No detectable plaque or stenosis. Aorta: Normal size, 31 mm at the mid ascending aorta (level of the  PA bifurcation) measured double oblique. No calcifications. No dissection. Aortic Valve: No calcifications. Other findings: Normal pulmonary vein drainage into the left atrium. Normal left atrial appendage without a thrombus. Mildly dilated main pulmonary artery, may indicated increased pulmonary artery pressure. IMPRESSION: 1. Minimal CAD, CADRADS = 1. 2. Coronary calcium score is 5, which places the patient in the 70th percentile for age and sex matched control. 3. Normal coronary origin with co-dominance. 4. Mildly dilated main pulmonary artery, may indicated increased pulmonary artery pressure. Recommendations: CAD-RADS 1. Minimal non-obstructive CAD (0-24%). Consider non-atherosclerotic causes of chest pain. Consider preventive therapy and risk factor modification. Electronically Signed   By: Cherlynn Kaiser   On: 12/21/2020 13:47   Result Date: 12/21/2020 EXAM: OVER-READ INTERPRETATION  CT CHEST The following report is an over-read  performed by radiologist Dr. Vinnie Langton of [facility], [place] on 12/21/2020. This over-read does not include interpretation of cardiac or coronary anatomy or pathology. The coronary calcium score/coronary CTA interpretation by the cardiologist is attached. COMPARISON:  Chest CTA 08/2020. FINDINGS: Within the visualized portions of the thorax there are no suspicious appearing pulmonary nodules or masses, there is no acute consolidative airspace disease, no pleural effusions, no pneumothorax and no lymphadenopathy. Visualized portions of the upper abdomen demonstrates some postoperative changes associated with the proximal stomach which are incompletely imaged, but suggestive of sleeve gastrectomy. There are no aggressive appearing lytic or blastic lesions noted in the visualized portions of the skeleton. IMPRESSION: 1. No significant incidental noncardiac findings are noted. Electronically Signed: By: Vinnie Langton M.D. On: 12/21/2020 13:02   DG Foot 2 Views Right  Result Date: 12/21/2020 CLINICAL DATA:  Right foot pain. No known injury. EXAM: RIGHT FOOT - 2 VIEW COMPARISON:  None. FINDINGS: There is no acute fracture or dislocation. Mild osteopenia. There is mild degenerative changes of the tarsal metatarsal joint with spurring. The soft tissues are unremarkable. IMPRESSION: No acute fracture or dislocation. Electronically Signed   By: Anner Crete M.D.   On: 12/21/2020 02:47   ECHOCARDIOGRAM COMPLETE  Result Date: 12/21/2020    ECHOCARDIOGRAM REPORT   Patient Name:   [name] Date of Exam: 12/21/2020 Medical Rec #:  [id number]          Height:       64.0 in Accession #:    [id number]         Weight:       210.0 lb Date of Birth:  1960/10/26           BSA:          1.997 m Patient Age:    32 years           BP:           127/79 mmHg Patient Gender: F                  HR:           52 bpm. Exam Location:  Inpatient Procedure: 2D Echo, Cardiac Doppler, Color Doppler and Intracardiac             Opacification Agent Indications:    Chest Pain R07.9  History:        Patient has prior history of Echocardiogram examinations, most                 recent 12/20/2015. Risk Factors:Hypertension, Dyslipidemia,                 Current Smoker and Sleep  Apnea.  Sonographer:    Vickie Epley RDCS Referring Phys: [id number]aki[address] Arntson V [place]  Sonographer Comments: Technically difficult study due to poor echo windows and suboptimal subcostal window. IMPRESSIONS  1. Left ventricular ejection fraction, by estimation, is 65 to 70%. The left ventricle has normal function. The left ventricle has no regional wall motion abnormalities. Left ventricular diastolic parameters are consistent with Grade I diastolic dysfunction (impaired relaxation).  2. Right ventricular systolic function is normal. The right ventricular size is normal. Tricuspid regurgitation signal is inadequate for assessing PA pressure.  3. The mitral valve is normal in structure. No evidence of mitral valve regurgitation. No evidence of mitral stenosis.  4. The aortic valve is normal in structure. Aortic valve regurgitation is not visualized. No aortic stenosis is present.  5. Increased flow velocities may be secondary to anemia, thyrotoxicosis, hyperdynamic or high flow state. FINDINGS  Left Ventricle: Left ventricular ejection fraction, by estimation, is 65 to 70%. The left ventricle has normal function. The left ventricle has no regional wall motion abnormalities. Definity contrast agent was given IV to delineate the left ventricular  endocardial borders. The left ventricular internal cavity size was normal in size. There is no left ventricular hypertrophy. Left ventricular diastolic parameters are consistent with Grade I diastolic dysfunction (impaired relaxation). Right Ventricle: The right ventricular size is normal. Right vetricular wall thickness was not well visualized. Right ventricular systolic function is normal. Tricuspid regurgitation signal is inadequate  for assessing PA pressure. Left Atrium: Left atrial size was normal in size. Right Atrium: Right atrial size was normal in size. Pericardium: There is no evidence of pericardial effusion. Presence of pericardial fat pad. Mitral Valve: The mitral valve is normal in structure. No evidence of mitral valve regurgitation. No evidence of mitral valve stenosis. Tricuspid Valve: The tricuspid valve is normal in structure. Tricuspid valve regurgitation is not demonstrated. No evidence of tricuspid stenosis. Aortic Valve: The aortic valve is normal in structure. Aortic valve regurgitation is not visualized. No aortic stenosis is present. Pulmonic Valve: The pulmonic valve was normal in structure. Pulmonic valve regurgitation is not visualized. No evidence of pulmonic stenosis. Aorta: The aortic root is normal in size and structure. Venous: The inferior vena cava was not well visualized. IAS/Shunts: No atrial level shunt detected by color flow Doppler.  LEFT VENTRICLE PLAX 2D LVIDd:         5.00 cm      Diastology LVIDs:         3.50 cm      LV e' medial:    5.51 cm/s LV PW:         0.90 cm      LV E/e' medial:  12.3 LV IVS:        0.90 cm      LV e' lateral:   7.05 cm/s LVOT diam:     2.00 cm      LV E/e' lateral: 9.6 LV SV:         112 LV SV Index:   56 LVOT Area:     3.14 cm  LV Volumes (MOD) LV vol d, MOD A2C: 117.0 ml LV vol d, MOD A4C: 95.9 ml LV vol s, MOD A2C: 38.2 ml LV vol s, MOD A4C: 25.8 ml LV SV MOD A2C:     78.8 ml LV SV MOD A4C:     95.9 ml LV SV MOD BP:      77.6 ml RIGHT VENTRICLE RV S prime:  17.60 cm/s TAPSE (M-mode): 1.9 cm LEFT ATRIUM             Index       RIGHT ATRIUM           Index LA diam:        4.10 cm 2.05 cm/m  RA Area:     15.80 cm LA Vol (A2C):   29.2 ml 14.62 ml/m RA Volume:   39.20 ml  19.63 ml/m LA Vol (A4C):   32.8 ml 16.42 ml/m LA Biplane Vol: 31.5 ml 15.77 ml/m  AORTIC VALVE LVOT Vmax:   131.00 cm/s LVOT Vmean:  97.100 cm/s LVOT VTI:    0.356 m  AORTA Ao Root diam: 3.10 cm Ao  Asc diam:  3.20 cm MITRAL VALVE MV Area (PHT): 2.55 cm    SHUNTS MV Decel Time: 297 msec    Systemic VTI:  0.36 m MV E velocity: 67.90 cm/s  Systemic Diam: 2.00 cm MV A velocity: 79.80 cm/s MV E/A ratio:  0.85 Cherlynn Kaiser MD Electronically signed by Cherlynn Kaiser MD Signature Date/Time: 12/21/2020/2:30:17 PM    Final    VAS [place] LOWER EXTREMITY VENOUS (DVT) (ONLY [facility] & [facility])  Result Date: 12/21/2020  Lower Venous DVT Study Patient Name:  [name]  Date of Exam:   12/20/2020 Medical Rec #: [id number]           Accession #:    [id number] Date of Birth: 10/03/1960            Patient Gender: F Patient Age:   84Y Exam [facility]  [facility] Procedure:      VAS [place] LOWER EXTREMITY VENOUS (DVT) Referring Phys: JOSEPH ZAMMIT --------------------------------------------------------------------------------  Indications: Pain, Swelling, and SOB. Other Indications: Patient with RLE knee, calf and ankle pain for past few days                    - negative x-ray '@PCP'$  sent for DVT rule out. Comparison Study: No previous exams Performing Technologist: Jody Hill RVT, RDMS  Examination Guidelines: A complete evaluation includes B-mode imaging, spectral Doppler, color Doppler, and power Doppler as needed of all accessible portions of each vessel. Bilateral testing is considered an integral part of a complete examination. Limited examinations for reoccurring indications may be performed as noted. The reflux portion of the exam is performed with the patient in reverse Trendelenburg.  +---------+---------------+---------+-----------+----------+--------------+ RIGHT    CompressibilityPhasicitySpontaneityPropertiesThrombus Aging +---------+---------------+---------+-----------+----------+--------------+ CFV      Full           Yes      Yes                                 +---------+---------------+---------+-----------+----------+--------------+ SFJ      Full                                                         +---------+---------------+---------+-----------+----------+--------------+ FV Prox  Full           Yes      Yes                                 +---------+---------------+---------+-----------+----------+--------------+ FV Mid  Full           Yes      Yes                                 +---------+---------------+---------+-----------+----------+--------------+ FV DistalFull           Yes      Yes                                 +---------+---------------+---------+-----------+----------+--------------+ PFV      Full                                                        +---------+---------------+---------+-----------+----------+--------------+ POP      Full           Yes      Yes                                 +---------+---------------+---------+-----------+----------+--------------+ PTV      Full                                                        +---------+---------------+---------+-----------+----------+--------------+ PERO     Full                                                        +---------+---------------+---------+-----------+----------+--------------+   +----+---------------+---------+-----------+----------+--------------+ LEFTCompressibilityPhasicitySpontaneityPropertiesThrombus Aging +----+---------------+---------+-----------+----------+--------------+ CFV Full           Yes      Yes                                 +----+---------------+---------+-----------+----------+--------------+    Summary: RIGHT: - There is no evidence of superficial venous thrombosis. - There is no evidence of deep vein thrombosis in the lower extremity.  - No cystic structure found in the popliteal fossa.  LEFT: - No evidence of common femoral vein obstruction.  *See table(s) above for measurements and observations. Electronically signed by Jamelle Haring on 12/21/2020 at 5:35:27 PM.    Final     Microbiology: Recent Results (from the past  240 hour(s))  SARS CORONAVIRUS 2 (TAT 6-24 HRS) Nasopharyngeal Nasopharyngeal Swab     Status: None   Collection Time: 12/20/20  8:06 PM   Specimen: Nasopharyngeal Swab  Result Value Ref Range Status   SARS Coronavirus 2 NEGATIVE NEGATIVE Final    Comment: (NOTE) SARS-CoV-2 target nucleic acids are NOT DETECTED.  The SARS-CoV-2 RNA is generally detectable in upper and lower respiratory specimens during the acute phase of infection. Negative results do not preclude SARS-CoV-2 infection, do not rule out co-infections with other pathogens, and should not be used as the sole basis for treatment or other patient management decisions. Negative results must be combined with clinical  observations, patient history, and epidemiological information. The expected result is Negative.  Fact Sheet for Patients: SugarRoll.be  Fact Sheet for Healthcare Providers: https://www.woods-mathews.com/  This test is not yet approved or cleared by the [place] FDA and  has been authorized for detection and/or diagnosis of SARS-CoV-2 by FDA under an Emergency Use Authorization (EUA). This EUA will remain  in effect (meaning this test can be used) for the duration of the COVID-19 declaration under Se ction 564(b)(1) of the Act, 21 U.S.C. section 360bbb-3(b)(1), unless the authorization is terminated or revoked sooner.  Performed at [facility], [place] [address] [place], [place] [address]      Labs: Basic Metabolic Panel: Recent Labs  Lab 12/20/20 1646 12/20/20 2346 12/21/20 0356  NA 141  --  139  K 4.0  --  4.1  CL 109  --  108  CO2 23  --  26  GLUCOSE 104*  --  104*  BUN 21*  --  20  CREATININE 0.98 0.82 0.80  CALCIUM 8.8*  --  8.7*   Liver Function Tests: Recent Labs  Lab 12/20/20 1646  AST 17  ALT 16  ALKPHOS 102  BILITOT 0.6  PROT 6.4*  ALBUMIN 3.8   No results for input(s): LIPASE, AMYLASE in the last 168 hours. No results  for input(s): AMMONIA in the last 168 hours. CBC: Recent Labs  Lab 12/20/20 1646 12/20/20 2346 12/21/20 0356  WBC 7.7 8.1 6.8  NEUTROABS 4.7  --   --   HGB 13.0 13.2 12.6  HCT 41.5 42.7 41.0  MCV 88.5 89.9 90.1  PLT 265 290 259   Cardiac Enzymes: No results for input(s): CKTOTAL, CKMB, CKMBINDEX, TROPONINI in the last 168 hours. BNP: BNP (last 3 results) No results for input(s): BNP in the last 8760 hours.  ProBNP (last 3 results) No results for input(s): PROBNP in the last 8760 hours.  CBG: No results for input(s): GLUCAP in the last 168 hours.     Signed:  Irine Seal MD.  [facility] 12/21/2020, 6:43 PM

## 2020-12-21 NOTE — Progress Notes (Signed)
  Echocardiogram 2D Echocardiogram has been performed.  [name] 12/21/2020, 2:07 PM

## 2020-12-21 NOTE — Progress Notes (Signed)
PROGRESS NOTE    [name]  [id number] DOB: May 31, 1960 DOA: 12/20/2020 PCP: [name], DO    Chief Complaint  Patient presents with   Chest Pain   Shortness of Breath   Leg Pain    Brief Narrative:  Patient 60 year old female history of hypertension, depression presented to the ED with worsening right foot pain, complaints of chest pain with shortness of breath on exertion.  Patient stated developed right knee pain about a week ago which orthopedic surgeon and placed on prednisone also started on allopurinol 4 days prior to admission for right foot pain.  Patient presented to orthopedic surgeon instructed to come back to the ED to rule out DVT.  Patient also with complaints of substernal chest pain and shortness of breath and as such was admitted for further work-up.  CT angiogram chest was negative for PE.  Lower extremity Dopplers were negative for DVT.  Cardiology consulted for further evaluation and management.  Assessment & Plan:   Principal Problem:   Chest pain Active Problems:   OSA (obstructive sleep apnea)   HTN (hypertension)  #1 chest pain and shortness of breath -Patient presented with chest pain with exertional shortness of breath. -Patient denied any ongoing chest pain. -Patient however with complaints of shortness of breath on exertion. -High-sensitivity troponins negative. -CT angiogram chest negative for PE or intrathoracic abnormality. -Check a 2D echo. -Due to multiple cardiac risk factors cardiology consulted for further evaluation and management. -Cardiology has assessed the patient and due to multiple risk factors recommended coronary CT which patient is in the process of being transferred to [place] cardiology input and recommendations.  2.  Right foot pain with recent right knee pain -Uric acid levels pending -Lower extremity Dopplers negative for DVT. -Pain films of the right foot negative for any acute  abnormalities. -Outpatient follow-up with orthopedics and PCP.  3.  Hypertension -Continue Norvasc.  4.  Depression/anxiety -Continue BuSpar and venlafaxine.  5.  Cholelithiasis noted on CT scan -Asymptomatic. -Outpatient follow-up.  6.  History of hyperlipidemia -Fasting lipid panel pending.    DVT prophylaxis: Lovenox Code Status: Full Family Communication: Updated patient.  No family at bedside. Disposition:   Status is: Observation  The patient remains OBS appropriate and will d/c before 2 midnights.  Dispo: The patient is from: Home              Anticipated d/c is to: Home              Patient currently is not medically stable to d/c.   Difficult to place patient No       Consultants:  Cardiology: Dr. Margaretann Loveless 12/21/2020  Procedures:  CT angiogram chest 12/20/2020 Chest x-ray 12/20/2020 Plain films of the right foot 12/21/2020 2D echo pending Cardiac/coronary CT pending Lower extremity Doppler 12/20/2020   Antimicrobials:  None   Subjective: In the ED on gurney getting ready to go to [facility] for cardiac CT.  Denies any ongoing chest pain.  Complains of shortness of breath on minimal exertion.  No abdominal pain.  Objective: Vitals:   12/21/20 0600 12/21/20 0700 12/21/20 0903 12/21/20 1000  BP: 129/80 122/78 135/87 127/79  Pulse: (!) 49 (!) 49  (!) 49  Resp: '11 11  13  '$ Temp:      TempSrc:      SpO2: 93% 93%  92%  Weight:      Height:       No intake or output data in  the 24 hours ending 12/21/20 1205 Filed Weights   12/20/20 1634  Weight: 95.3 kg    Examination:  General exam: Appears calm and comfortable  Respiratory system: Clear to auscultation anterior lung fields.  No wheezes, no crackles, no rhonchi.Marland Kitchen Respiratory effort normal. Cardiovascular system: S1 & S2 heard, RRR. No JVD, murmurs, rubs, gallops or clicks. No pedal edema. Gastrointestinal system: Abdomen is nondistended, obese, soft and nontender. No organomegaly or masses felt.  Normal bowel sounds heard. Central nervous system: Alert and oriented. No focal neurological deficits. Extremities: Symmetric 5 x 5 power. Skin: No rashes, lesions or ulcers Psychiatry: Judgement and insight appear normal. Mood & affect appropriate.     Data Reviewed: I have personally reviewed following labs and imaging studies  CBC: Recent Labs  Lab 12/20/20 1646 12/20/20 2346 12/21/20 0356  WBC 7.7 8.1 6.8  NEUTROABS 4.7  --   --   HGB 13.0 13.2 12.6  HCT 41.5 42.7 41.0  MCV 88.5 89.9 90.1  PLT 265 290 [id number]    Basic Metabolic Panel: Recent Labs  Lab 12/20/20 1646 12/20/20 2346 12/21/20 0356  NA 141  --  139  K 4.0  --  4.1  CL 109  --  108  CO2 23  --  26  GLUCOSE 104*  --  104*  BUN 21*  --  20  CREATININE 0.98 0.82 0.80  CALCIUM 8.8*  --  8.7*    GFR: Estimated Creatinine Clearance: 84.7 mL/min (by C-G formula based on SCr of 0.8 mg/dL).  Liver Function Tests: Recent Labs  Lab 12/20/20 1646  AST 17  ALT 16  ALKPHOS 102  BILITOT 0.6  PROT 6.4*  ALBUMIN 3.8    CBG: No results for input(s): GLUCAP in the last 168 hours.   Recent Results (from the past 240 hour(s))  SARS CORONAVIRUS 2 (TAT 6-24 HRS) Nasopharyngeal Nasopharyngeal Swab     Status: None   Collection Time: 12/20/20  8:06 PM   Specimen: Nasopharyngeal Swab  Result Value Ref Range Status   SARS Coronavirus 2 NEGATIVE NEGATIVE Final    Comment: (NOTE) SARS-CoV-2 target nucleic acids are NOT DETECTED.  The SARS-CoV-2 RNA is generally detectable in upper and lower respiratory specimens during the acute phase of infection. Negative results do not preclude SARS-CoV-2 infection, do not rule out co-infections with other pathogens, and should not be used as the sole basis for treatment or other patient management decisions. Negative results must be combined with clinical observations, patient history, and epidemiological information. The expected result is Negative.  Fact Sheet for  Patients: SugarRoll.be  Fact Sheet for Healthcare Providers: https://www.woods-mathews.com/  This test is not yet approved or cleared by the [place] FDA and  has been authorized for detection and/or diagnosis of SARS-CoV-2 by FDA under an Emergency Use Authorization (EUA). This EUA will remain  in effect (meaning this test can be used) for the duration of the COVID-19 declaration under Se ction 564(b)(1) of the Act, 21 U.S.C. section 360bbb-3(b)(1), unless the authorization is terminated or revoked sooner.  Performed at [facility], [place] [address], [place], [place] [address]          Radiology Studies: DG Chest 2 View  Result Date: 12/20/2020 CLINICAL DATA:  Chest pain.  Shortness of breath EXAM: CHEST - 2 VIEW COMPARISON:  Chest x-ray 04/09/2020, CT chest 12/31/2016 FINDINGS: The heart size and mediastinal contours are unchanged. Aortic calcification. No focal consolidation. No pulmonary edema. No pleural effusion. No pneumothorax.  No acute osseous abnormality. IMPRESSION: No active cardiopulmonary disease. Electronically Signed   By: Iven Finn M.D.   On: 12/20/2020 17:41   CT Angio Chest PE W and/or Wo Contrast  Result Date: 12/20/2020 CLINICAL DATA:  Chest pain or SOB, pleurisy or effusion suspected. Pt c/o cp and shob that's been on and off since Saturday. Also reports right knee pain that has spread to back of leg and down to ankle. EXAM: CT ANGIOGRAPHY CHEST WITH CONTRAST TECHNIQUE: Multidetector CT imaging of the chest was performed using the standard protocol during bolus administration of intravenous contrast. Multiplanar CT image reconstructions and MIPs were obtained to evaluate the vascular anatomy. CONTRAST:  188m OMNIPAQUE IOHEXOL 350 MG/ML SOLN COMPARISON:  None. FINDINGS: Cardiovascular: Satisfactory opacification of the pulmonary arteries to the segmental level. Limited evaluation more distally due to motion  artifact. No evidence of pulmonary embolism. The main pulmonary artery measures at the upper limits of normal. Normal heart size. No significant pericardial effusion. The thoracic aorta is normal in caliber. Mild atherosclerotic plaque of the thoracic aorta. At least 1 vessel coronary artery calcifications. Mediastinum/Nodes: No enlarged mediastinal, hilar, or axillary lymph nodes. Thyroid gland, trachea, and esophagus demonstrate no significant findings. Lungs/Pleura: Expiratory phase of respiration with mosaic attenuation of the lungs. No focal consolidation. Few scattered pulmonary micronodules. No pulmonary mass. No pleural effusion. No pneumothorax. Upper Abdomen: Multiple calcified stones within the gallbladder lumen. Gastric sleeve surgical changes. No acute abnormality. Musculoskeletal: No chest wall abnormality. No suspicious lytic or blastic osseous lesions. No acute displaced fracture. Multilevel degenerative changes of the spine. Review of the MIP images confirms the above findings. IMPRESSION: 1. No pulmonary embolus. 2. No acute intrathoracic abnormality. 3. Cholelithiasis. Electronically Signed   By: MIven FinnM.D.   On: 12/20/2020 19:13   DG Foot 2 Views Right  Result Date: 12/21/2020 CLINICAL DATA:  Right foot pain. No known injury. EXAM: RIGHT FOOT - 2 VIEW COMPARISON:  None. FINDINGS: There is no acute fracture or dislocation. Mild osteopenia. There is mild degenerative changes of the tarsal metatarsal joint with spurring. The soft tissues are unremarkable. IMPRESSION: No acute fracture or dislocation. Electronically Signed   By: AAnner CreteM.D.   On: 12/21/2020 02:47   VAS [place]OWER EXTREMITY VENOUS (DVT) (ONLY [facility] & [facility])  Result Date: 12/20/2020  Lower Venous DVT Study Patient Name:  CALETHIA GOSHORN Date of Exam:   12/20/2020 Medical Rec #: [id number]          Accession #:    [id number] of Birth: [birth year]           Patient Gender: F Patient Age:   64YEx[facility]      VAS [place]OWER EXTREMITY VENOUS (DVT) Referring Phys: JBroadus JohnZAMMIT --------------------------------------------------------------------------------  Indications: Pain, Swelling, and SOB. Other Indications: Patient with RLE knee, calf and ankle pain for past few days                    - negative x-ray '@PCP'$  sent for DVT rule out. Comparison Study: No previous exams Performing Technologist: Jody Hill RVT, RDMS  Examination Guidelines: A complete evaluation includes B-mode imaging, spectral Doppler, color Doppler, and power Doppler as needed of all accessible portions of each vessel. Bilateral testing is considered an integral part of a complete examination. Limited examinations for reoccurring indications may be performed as noted. The reflux portion of the exam is performed with the patient  in reverse Trendelenburg.  +---------+---------------+---------+-----------+----------+--------------+ RIGHT    CompressibilityPhasicitySpontaneityPropertiesThrombus Aging +---------+---------------+---------+-----------+----------+--------------+ CFV      Full           Yes      Yes                                 +---------+---------------+---------+-----------+----------+--------------+ SFJ      Full                                                        +---------+---------------+---------+-----------+----------+--------------+ FV Prox  Full           Yes      Yes                                 +---------+---------------+---------+-----------+----------+--------------+ FV Mid   Full           Yes      Yes                                 +---------+---------------+---------+-----------+----------+--------------+ FV DistalFull           Yes      Yes                                 +---------+---------------+---------+-----------+----------+--------------+ PFV      Full                                                         +---------+---------------+---------+-----------+----------+--------------+ POP      Full           Yes      Yes                                 +---------+---------------+---------+-----------+----------+--------------+ PTV      Full                                                        +---------+---------------+---------+-----------+----------+--------------+ PERO     Full                                                        +---------+---------------+---------+-----------+----------+--------------+   +----+---------------+---------+-----------+----------+--------------+ LEFTCompressibilityPhasicitySpontaneityPropertiesThrombus Aging +----+---------------+---------+-----------+----------+--------------+ CFV Full           Yes      Yes                                 +----+---------------+---------+-----------+----------+--------------+  Summary: RIGHT: - There is no evidence of superficial venous thrombosis. - There is no evidence of deep vein thrombosis in the lower extremity.  - No cystic structure found in the popliteal fossa.  LEFT: - No evidence of common femoral vein obstruction.  *See table(s) above for measurements and observations.    Preliminary         Scheduled Meds:  amLODipine  5 mg Oral Daily   busPIRone  7.5 mg Oral Daily   enoxaparin (LOVENOX) injection  40 mg Subcutaneous Q24H   pantoprazole  40 mg Oral Daily   venlafaxine XR  75 mg Oral Daily   Continuous Infusions:   LOS: 0 days    Time spent: 35 minutes    Irine Seal, MD [facility]   To contact the attending provider between 7A-7P or the covering provider during after hours 7P-7A, please log into the web site [email or web address] and access using universal [place] password for that web site. If you do not have the password, please call the hospital operator.  12/21/2020, 12:05 PM

## 2021-01-03 ENCOUNTER — Ambulatory Visit: Payer: Managed Care, Other (non HMO) | Admitting: Podiatry

## 2021-01-11 ENCOUNTER — Ambulatory Visit: Payer: Managed Care, Other (non HMO) | Admitting: Internal Medicine

## 2021-01-17 ENCOUNTER — Encounter: Payer: Self-pay | Admitting: Podiatry

## 2021-01-17 ENCOUNTER — Ambulatory Visit (INDEPENDENT_AMBULATORY_CARE_PROVIDER_SITE_OTHER): Payer: Managed Care, Other (non HMO) | Admitting: Podiatry

## 2021-01-17 ENCOUNTER — Other Ambulatory Visit: Payer: Self-pay

## 2021-01-17 DIAGNOSIS — M6528 Calcific tendinitis, other site: Secondary | ICD-10-CM | POA: Diagnosis not present

## 2021-01-17 DIAGNOSIS — M7661 Achilles tendinitis, right leg: Secondary | ICD-10-CM

## 2021-01-17 DIAGNOSIS — S86011A Strain of right Achilles tendon, initial encounter: Secondary | ICD-10-CM | POA: Diagnosis not present

## 2021-01-17 MED ORDER — MELOXICAM 15 MG PO TABS: 15.0000 mg | ORAL_TABLET | Freq: Every day | ORAL | 3 refills | Status: DC

## 2021-01-17 MED ORDER — DICLOFENAC SODIUM 1 % EX GEL: 4.0000 g | Freq: Four times a day (QID) | CUTANEOUS | 4 refills | Status: DC

## 2021-01-17 NOTE — Patient Instructions (Signed)
Look for Voltaren gel at the pharmacy over the counter or online (also known as diclofenac 1% gel). Apply to the painful areas 3-4x daily with the supplied dosing card. Allow to dry for 10 minutes before going into socks/shoes  

## 2021-01-22 NOTE — Progress Notes (Signed)
  Subjective:  Patient ID: [name], female    DOB: 1961/01/21,  MRN: [id number]  Chief Complaint  Patient presents with   Foot Pain     right heel pain    60 y.o. female presents with the above complaint. History confirmed with patient.  Her left foot is doing much better since I saw her last year.  She has been having right posterior heel pain for the last 2 months.  She had x-rays done at her orthopedic surgeon.  She took some prednisone and this did not help.  She says she felt a pop at some point when she fell back in the spring.  Has been very painful and has not been improving after the last few months.  Objective:  Physical Exam: warm, good capillary refill, no trophic changes or ulcerative lesions, normal DP and PT pulses, and normal sensory exam. Left Foot: normal exam, no swelling, tenderness, instability; ligaments intact, full range of motion of all ankle/foot joints Right Foot: Sharp pain on the posterior heel and she has limited strength 4 out of 5 with resisted plantarflexion     Radiographs: Multiple views x-ray of the right foot taken on 12/21/2020: Large posterior calcaneal spurs with calcifications Assessment:   1. Calcific Achilles tendinitis of right lower extremity   2. Partial tear of right Achilles tendon, initial encounter      Plan:  Patient was evaluated and treated and all questions answered.  Discussed the etiology and treatment options for Achilles tendinitis including stretching, formal physical therapy with an eccentric exercises therapy plan, supportive shoegears such as a running shoe or sneaker, heel lifts, topical and oral medications.  We also discussed that I do not routinely perform injections in this area because of the risk of an increased damage or rupture of the tendon.  We also discussed the role of surgical treatment of this for patients who do not improve after exhausting non-surgical treatment options.  I reviewed the  radiographic and clinical findings with her and she is had quite a significant injury.  My chief concern would be for a partial tear considering the history of traumatic injury.  I recommended MRI to evaluate this and for possible surgical planning if indicated.  In the interim we will increase her OTC NSAIDs to a prescription Mobic, use Voltaren gel 3-4 times daily and I placed her in a CAM boot.  I will see her back after the MRI to review   Return in about 4 weeks (around 02/14/2021) for re-check Achilles tendon, after MRI to review.

## 2021-01-23 ENCOUNTER — Ambulatory Visit
Admission: RE | Admit: 2021-01-23 | Discharge: 2021-01-23 | Disposition: A | Discharge status: Home / Self Care | Payer: Managed Care, Other (non HMO) | Source: Ambulatory Visit | Attending: Podiatry | Admitting: Podiatry

## 2021-01-23 DIAGNOSIS — M6528 Calcific tendinitis, other site: Secondary | ICD-10-CM

## 2021-01-23 DIAGNOSIS — M7661 Achilles tendinitis, right leg: Secondary | ICD-10-CM

## 2021-01-23 DIAGNOSIS — S86011A Strain of right Achilles tendon, initial encounter: Secondary | ICD-10-CM

## 2021-01-29 ENCOUNTER — Telehealth: Payer: Self-pay | Admitting: *Deleted

## 2021-01-29 NOTE — Telephone Encounter (Signed)
Patient is calling because her right foot is still hurting, "nothing is touching this pain",is no better and is hard to stand. Her MRI has been completed, would like the results. Please advise.

## 2021-01-30 ENCOUNTER — Other Ambulatory Visit: Payer: Self-pay | Admitting: Podiatry

## 2021-01-30 MED ORDER — TRAMADOL HCL 50 MG PO TABS: 50.0000 mg | ORAL_TABLET | ORAL | 0 refills | Status: DC | PRN

## 2021-01-30 NOTE — Progress Notes (Signed)
As needed Achilles tendinitis pain

## 2021-01-30 NOTE — Telephone Encounter (Signed)
Stay in the cam boot weightbearing as tolerated.  I also sent in a prescription for tramadol 50 mg Q4H. continue meloxicam 15 mg

## 2021-01-31 ENCOUNTER — Ambulatory Visit (INDEPENDENT_AMBULATORY_CARE_PROVIDER_SITE_OTHER): Payer: Managed Care, Other (non HMO) | Admitting: Podiatry

## 2021-01-31 ENCOUNTER — Encounter: Payer: Self-pay | Admitting: Podiatry

## 2021-01-31 ENCOUNTER — Other Ambulatory Visit: Payer: Self-pay

## 2021-01-31 DIAGNOSIS — M6528 Calcific tendinitis, other site: Secondary | ICD-10-CM

## 2021-01-31 DIAGNOSIS — T148XXA Other injury of unspecified body region, initial encounter: Secondary | ICD-10-CM | POA: Diagnosis not present

## 2021-01-31 NOTE — Progress Notes (Signed)
Subjective:   Patient ID: [name], female   DOB: 60 y.o.   MRN: [id number]   HPI Patient presents stating the pain is intensifying in her right ankle and states it seems to be more on the outside of the back   ROS      Objective:  Physical Exam  Neurovascular status intact with pain that is really more within the peroneal tendon as it comes under the lateral malleolus.  I did not note tendon dysfunction but I found the pain to be most intensified in this area versus the Achilles tendon insertion which is mildly to moderately sore when palpated.  Patient is limping with gait     Assessment:  Probability that this is a tear of the peroneal tendon versus Achilles pathology which may be present but secondary     Plan:  H&P reviewed condition and I do think that peroneal repair will be necessary based on tear on the MRI.  I am going to tentatively schedule her for surgery as she wants to get it done as soon as possible with Dr. Sherryle Lis and I am going to have her see him prior to procedure and I did discuss today with her recovery and that she will probably be nonweightbearing for upwards of 4 weeks and that recovery will be a 12 to 16-week process.  Patient to be seen back by Dr. Sherryle Lis

## 2021-02-04 NOTE — Telephone Encounter (Signed)
MRI results for patient done on 01/23/21

## 2021-02-05 ENCOUNTER — Telehealth: Payer: Self-pay | Admitting: *Deleted

## 2021-02-05 NOTE — Telephone Encounter (Signed)
Patient is requesting a sooner appointment (scheduled for 02/11/21) this week w/ Dr Sherryle Lis, her foot is giving her a fit

## 2021-02-11 ENCOUNTER — Other Ambulatory Visit: Payer: Self-pay

## 2021-02-11 ENCOUNTER — Telehealth: Payer: Self-pay | Admitting: Urology

## 2021-02-11 ENCOUNTER — Ambulatory Visit (INDEPENDENT_AMBULATORY_CARE_PROVIDER_SITE_OTHER): Payer: Managed Care, Other (non HMO) | Admitting: Podiatry

## 2021-02-11 ENCOUNTER — Telehealth: Payer: Self-pay | Admitting: Podiatry

## 2021-02-11 DIAGNOSIS — M21861 Other specified acquired deformities of right lower leg: Secondary | ICD-10-CM

## 2021-02-11 DIAGNOSIS — M9261 Juvenile osteochondrosis of tarsus, right ankle: Secondary | ICD-10-CM

## 2021-02-11 DIAGNOSIS — M216X1 Other acquired deformities of right foot: Secondary | ICD-10-CM

## 2021-02-11 DIAGNOSIS — M6528 Calcific tendinitis, other site: Secondary | ICD-10-CM | POA: Diagnosis not present

## 2021-02-11 DIAGNOSIS — M62461 Contracture of muscle, right lower leg: Secondary | ICD-10-CM

## 2021-02-11 DIAGNOSIS — S86311A Strain of muscle(s) and tendon(s) of peroneal muscle group at lower leg level, right leg, initial encounter: Secondary | ICD-10-CM | POA: Diagnosis not present

## 2021-02-11 DIAGNOSIS — M7661 Achilles tendinitis, right leg: Secondary | ICD-10-CM

## 2021-02-11 DIAGNOSIS — M79676 Pain in unspecified toe(s): Secondary | ICD-10-CM

## 2021-02-11 MED ORDER — TRAMADOL HCL 50 MG PO TABS: 50.0000 mg | ORAL_TABLET | ORAL | 0 refills | Status: DC | PRN

## 2021-02-11 NOTE — Addendum Note (Signed)
Addended bySherryle Lis, Vincen Bejar R on: 02/11/2021 05:39 PM   Modules accepted: Orders

## 2021-02-11 NOTE — Telephone Encounter (Signed)
Patient called the office requesting pain medication.    Please advise

## 2021-02-11 NOTE — Telephone Encounter (Signed)
DOS - 03/01/21  REPAIR POST TIBIAL TENDON RIGHT --- 01007 GASTROCNEMIUS RECESS RIGHT --- 12197 REPAIR ACHILLES TENDON RIGHT --- 58832 CALCANEAL OSTEOTOMY RIGHT --- [place] EFFECTIVE DATE - 05/20/19   PER [employer] C[employer] CODES 54982, 64158, 30940 AND 76808 NO PRIOR AUTH IS REQUIRED.   REF # [id number] REF # [id number] REF # [id number] REF # [id number]

## 2021-02-11 NOTE — Progress Notes (Signed)
Subjective:  Patient ID: [name], female    DOB: [birth year],  MRN: [id number]  Chief Complaint  Patient presents with   Tendonitis      re-check Achilles tendon, after MRI to review/SURGERY CONSULT    60 y.o. female returns for follow-up with the above complaint. History confirmed with patient.  She completed the MRI.  The foot is still very painful.  The boot did not help and made it feel worse.  Physical therapy was not helpful  Objective:  Physical Exam: warm, good capillary refill, no trophic changes or ulcerative lesions, normal DP and PT pulses, and normal sensory exam. Left Foot: normal exam, no swelling, tenderness, instability; ligaments intact, full range of motion of all ankle/foot joints Right Foot: Sharp pain on the posterior heel and she has limited strength 4 out of 5 with resisted plantarflexion, she has pain over the peroneal tendons  Study Result  Narrative & Impression  CLINICAL DATA:  Achilles tendon trauma or laceration   EXAM: MR OF THE RIGHT HEEL WITHOUT CONTRAST   TECHNIQUE: Multiplanar, multisequence MR imaging of the right heel was performed. No intravenous contrast was administered.   COMPARISON:  Right foot radiograph 12/21/2020   FINDINGS: TENDONS   Peroneal: There is a split peroneus brevis tear at and below the lateral malleolus. Length of the tendon tear is approximally 4.8 cm. There is likely continuity distally at its attachment on the base of the fifth metatarsal. The peroneus longus tendon is intact.   Posteromedial: Very mild tenosynovitis of the posterior tibial tendon and flexor digitorum tendon above and below the medial malleolus. Flexor hallucis longus tendon intact.   Anterior: Tibialis anterior tendon intact. Extensor hallucis longus tendon intact Extensor digitorum longus tendon intact.   Achilles: There is insertional Achilles tendinosis with mild peritendinitis. There is mild retrocalcaneal bursitis. There  is reactive bony edema in the posterolateral calcaneus. No evidence of Achilles tendon tear.   Plantar Fascia: Intact.   LIGAMENTS   Lateral: Anterior talofibular ligament intact. Calcaneofibular ligament intact. Posterior talofibular ligament intact. Anterior and posterior tibiofibular ligaments intact.   Medial: Deltoid ligament intact. Spring ligament intact.   CARTILAGE   Ankle Joint: No joint effusion. Normal ankle mortise. No chondral defect.   Subtalar Joints/Sinus Tarsi: Normal subtalar joints. No subtalar joint effusion. Normal sinus tarsi.   Bones: There is reactive bone marrow edema in the posterolateral calcaneus at the Achilles insertion. 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 There is focal bony edema within the proximal fourth metatarsal, without visible fracture, likely stress reaction. Mild second tarsometatarsal joint osteoarthritis. No significant tibiotalar arthritis.   Soft Tissue: No significant muscle atrophy. No focal fluid collection.   IMPRESSION: Insertional Achilles tendinosis with mild peritendinitis and mild retrocalcaneal bursitis. Associated reactive bony edema in the posterolateral calcaneus.   Long segment split peroneus brevis tear at and below the lateral malleolus, tear length measuring approximately 4.8 cm.   Very mild tenosynovitis of the posterior tibial tendon and flexor digitorum tendon above and below the medial malleolus.   Moderate navicular-medial cuneiform osteoarthritis.   Focal bony edema within the proximal fourth metatarsal, likely a stress reaction.     Electronically Signed   By: Maurine Simmering M.D.   On: 01/25/2021 08:43       Radiographs: Multiple views x-ray of the right foot taken on 12/21/2020: Large posterior calcaneal spurs with calcifications Assessment:   1. Calcific Achilles tendinitis of right lower extremity   2. Peroneal tendon tear, right, initial  encounter   3. Haglund's  deformity of right heel   4. Gastrocnemius equinus of right lower extremity      Plan:  Patient was evaluated and treated and all questions answered.  I reviewed the MRI findings with her.  So far she has not improved despite several months of nonsurgical treatment including anti-inflammatories immobilization and physical therapy.  We discussed proceeding with surgical intervention.  Her MRI certainly shows a significant amount of inflammation with degenerative changes of the Achilles tendon, reactive bone marrow edema in the posterior heel and Haglund's deformity, retrocalcaneal bursitis as well as a peroneal tendon tear.  Surgical we discussed repair of all these issues resection of the spurs and Haglund's deformity reattachment of the Achilles tendon lengthening of the gastrocnemius muscle and repair of the peroneal tendon tear.  She may require tenodesis as well.  We discussed the risk, benefits and potential complications including but not limited to pain, swelling, infection, scar, numbness which may be temporary or permanent, chronic pain, stiffness, nerve pain or damage, wound healing problems, bone healing problems including delayed or non-union.  We discussed the risk of smoking on wound healing and infection after surgery and she will stop smoking now in advance of surgery and will stop smoking after surgery as well.  I discussed with her the period of nonweightbearing for 6 weeks including a period of casting and transition to boot and physical therapy after surgery.  I recommended a rolling knee scooter which I provided an order for and we will help to arrange this.  Informed sent was signed and reviewed.  Surgery be scheduled at a mutually agreeable date.   Surgical plan:  Procedure: -Right heel secondary Achilles repair, resection of Haglund's deformity and calcaneal spurs posterior heel, gastrocnemius lengthening, peroneal tendon tear repair and possible  tenodesis  Location: -[facility]  Anesthesia plan: -General and regional with prone positioning  Postoperative pain plan: - Tylenol 1000 mg every 6 hours, gabapentin 300 mg every 8 hours x5 days, oxycodone 5 mg 1-2 tabs every 6 hours only as needed  DVT prophylaxis: -Xarelto 10 mg nightly  WB Restrictions / DME needs: -NWB in short leg cast after surgery     No follow-ups on file.

## 2021-02-13 ENCOUNTER — Encounter: Payer: Self-pay | Admitting: Podiatry

## 2021-02-14 MED ORDER — OXYCODONE HCL 5 MG PO TABS: 5.0000 mg | ORAL_TABLET | Freq: Four times a day (QID) | ORAL | 0 refills | Status: AC | PRN

## 2021-02-21 ENCOUNTER — Ambulatory Visit: Payer: Managed Care, Other (non HMO) | Admitting: Podiatry

## 2021-02-28 NOTE — Progress Notes (Deleted)
Cardiology Office Note   Date:  02/28/2021   ID:  [name], DOB 12-21-1960, MRN [id number]  PCP:  [name], DO  Cardiologist:  Dr.Acharya  No chief complaint on file.    History of Present Illness: [name] is a 60 y.o. female who presents for posthospitalization follow-up.  She was seen on consultation by Dr.Acharya for evaluation of chest pain during recent admission between 12/20/2020 and 12/21/2020.  She has a history of multiple cardiovascular risk factors to include hypertension, hyperlipidemia, current tobacco abuse, family history (father had MI at age 24, sister with MI at age 62) with other history of hyperparathyroidism, COVID-19 (2021), GERD, and OSA with anxiety and depression.  She reported complaints of intermittent chest pain at rest over the last 2 weeks prior to evaluation at [facility] long ED.  She had a severe episode on 12/08/2020 with associated nausea and diaphoresis.  On evaluation, the patient's EKG was normal with no ischemic changes, chest x-ray was negative for any acute findings, D-dimer was elevated but chest CTA was negative for PE.  It was noted that she had multiple calcified stones within the gallbladder, and at least one-vessel coronary artery calcification.  She was planned for coronary CTA, treated with amlodipine 5 mg daily for hypertension, fasting lipid panel was ordered along with thyroid panel.  Echocardiogram was also completed during hospitalization revealing EF of 65 to 70% with grade 1 diastolic dysfunction.  There were no valvular abnormalities.  Cardiac CTA revealed a calcium score of 5.  Coronary arteries were normal with no detectable plaque in the right coronary artery or left main.  Left anterior descending artery had minimal mixed atherosclerotic plaque in the mid LAD (less than 25% stenosis) at the bifurcation of the first diagonal branch.  There was also minimal mixed atherosclerotic plaque in the proximal first diagonal artery  (less than 25% stenosis).  The left circumflex had no detectable plaque or stenosis.  The aorta was normal size at 31 mm at the mid ascending aortic level.  The aortic valve had no calcifications.  Her CADRADS equaled 1.  She was recommended for isosorbide 15 mg daily with close follow-up.  No other changes were made to her medication regimen.  The patient also complained of right knee pain and right foot pain.  She was ruled out for DVT.  She was to follow-up with orthopedics for ongoing evaluation and management.  She was also noted to have cholelithiasis on CT scan prior to cardiac CTA.  She was to follow-up as an outpatient with primary care.  Past Medical History:  Diagnosis Date   Anxiety    Depression    Fatty liver    History of kidney stones    Hyperlipidemia    Hyperparathyroidism ([place])    Hypertension    Nephrolithiasis    Obesity    OSA (obstructive sleep apnea) 03/09/2013   denies; does not use CPAP    Pre-diabetes    last A1C in 2015 5.9% see 01-13-14 lab epic    Sleep apnea    Vitamin D deficiency    Wound, breast    superficial appearing , reddened area to left upper breast; per patient, has been in sun recently , denies pain or drainage , "just itchy"     Past Surgical History:  Procedure Laterality Date   BACK SURGERY     BREAST LUMPECTOMY WITH RADIOACTIVE SEED LOCALIZATION Right 02/05/2018   Procedure: BREAST LUMPECTOMY WITH RADIOACTIVE SEED LOCALIZATION;  Surgeon:  Alphonsa Overall, MD;  Location: [facility];  Service: General;  Laterality: Right;   BREATH TEK H PYLORI N/A 08/31/2014   Procedure: BREATH TEK Kandis Ban;  Surgeon: Alphonsa Overall, MD;  Location: [facility];  Service: General;  Laterality: N/A;   CYSTOSCOPY WITH STENT PLACEMENT Right 03/21/2020   Procedure: CYSTOSCOPY WITH STENT PLACEMENT;  Surgeon: Raynelle Bring, MD;  Location: [facility];  Service: Urology;  Laterality: Right;   CYSTOSCOPY/URETEROSCOPY/HOLMIUM LASER/STENT PLACEMENT Right 04/09/2020   Procedure:  CYSTOSCOPY/URETEROSCOPYWITH STONE REMOVAL;  Surgeon: Raynelle Bring, MD;  Location: [facility];  Service: Urology;  Laterality: Right;   LAPAROSCOPIC GASTRIC SLEEVE RESECTION N/A 02/20/2015   Procedure: LAPAROSCOPIC GASTRIC SLEEVE RESECTION;  Surgeon: Alphonsa Overall, MD;  Location: [facility];  Service: General;  Laterality: N/A;   PARTIAL HYSTERECTOMY     sleep study  04/08/13   THYROID SURGERY     TOTAL HIP ARTHROPLASTY Right 02/27/2017   Procedure: RIGHT TOTAL HIP ARTHROPLASTY ANTERIOR APPROACH;  Surgeon: Dorna Leitz, MD;  Location: [facility];  Service: Orthopedics;  Laterality: Right;  Panel Lenght: 120 mins     Current Outpatient Medications  Medication Sig Dispense Refill   amLODipine (NORVASC) 5 MG tablet Take 5 mg by mouth daily.     busPIRone (BUSPAR) 7.5 MG tablet Take 7.5 mg by mouth in the morning.     Cholecalciferol (VITAMIN D-3) 5000 UNITS TABS Take 5,000 Units by mouth every morning.      diclofenac Sodium (VOLTAREN) 1 % GEL Apply 4 g topically 4 (four) times daily. 100 g 4   esomeprazole (NEXIUM) 20 MG capsule Take 20 mg by mouth at bedtime.     isosorbide mononitrate (IMDUR) 30 MG 24 hr tablet Take 0.5 tablets (15 mg total) by mouth daily. 30 tablet 0   meloxicam (MOBIC) 15 MG tablet Take 1 tablet (15 mg total) by mouth daily. 30 tablet 3   methocarbamol (ROBAXIN) 500 MG tablet Take 500 mg by mouth every 8 (eight) hours as needed for muscle spasms.     venlafaxine XR (EFFEXOR-XR) 75 MG 24 hr capsule Take 75 mg by mouth in the morning.  12   zolpidem (AMBIEN) 5 MG tablet Take 5 mg by mouth at bedtime as needed for sleep.     No current facility-administered medications for this visit.    Allergies:   Cortisone, Aspirin, Lisinopril, Hydrocodone, and Sulfa antibiotics    Social History:  The patient  reports that she has been smoking cigarettes. She has a 5.00 pack-year smoking history. She has never used smokeless tobacco. She reports that she does not drink alcohol and does not use  drugs.   Family History:  The patient's family history includes COPD in her mother; Heart attack in her father and mother; Heart disease in her father and mother; Hypertension in her brother, mother, sister, and sister; Other in her son.    ROS: All other systems are reviewed and negative. Unless otherwise mentioned in H&P    PHYSICAL EXAM: VS:  There were no vitals taken for this visit. , BMI There is no height or weight on file to calculate BMI. GEN: Well nourished, well developed, in no acute distress HEENT: normal Neck: no JVD, carotid bruits, or masses Cardiac: ***RRR; no murmurs, rubs, or gallops,no edema  Respiratory:  Clear to auscultation bilaterally, normal work of breathing GI: soft, nontender, nondistended, + BS MS: no deformity or atrophy Skin: warm and dry, no rash Neuro:  Strength and sensation are intact Psych: euthymic mood,  full affect   EKG:  EKG {ACTION; IS/IS AQT:[id number]} ordered today. The ekg ordered today demonstrates ***   Recent Labs: 12/20/2020: ALT 16 12/21/2020: BUN 20; Creatinine, Ser 0.80; Hemoglobin 12.6; Platelets 259; Potassium 4.1; Sodium 139; TSH 3.917    Lipid Panel    Component Value Date/Time   CHOL 168 12/21/2020 0906   TRIG 108 12/21/2020 0906   HDL 45 12/21/2020 0906   CHOLHDL 3.7 12/21/2020 0906   VLDL 22 12/21/2020 0906   LDLCALC 101 (H) 12/21/2020 0906      Wt Readings from Last 3 Encounters:  12/20/20 210 lb (95.3 kg)  04/09/20 212 lb (96.2 kg)  04/04/20 212 lb (96.2 kg)      Other studies Reviewed: Additional studies/ records that were reviewed today include: ***. Review of the above records demonstrates: ***   ASSESSMENT AND PLAN:  1.  ***   Current medicines are reviewed at length with the patient today.  I have spent *** dedicated to the care of this patient on the date of this encounter to include pre-visit review of records, assessment, management and diagnostic testing,with shared decision making.  Labs/  tests ordered today include: *** Phill Myron. [place] Pugh, ANP, AACC   02/28/2021 1:13 PM    [facility] [address] Office [phone] Fax [phone]  Notice: This dictation was prepared with Dragon dictation along with smaller phrase technology. Any transcriptional errors that result from this process are unintentional and may not be corrected upon review.

## 2021-03-01 ENCOUNTER — Ambulatory Visit: Payer: Managed Care, Other (non HMO) | Admitting: Adult Health

## 2021-03-01 ENCOUNTER — Telehealth: Payer: Self-pay | Admitting: *Deleted

## 2021-03-01 ENCOUNTER — Other Ambulatory Visit: Payer: Self-pay | Admitting: Podiatry

## 2021-03-01 DIAGNOSIS — M7661 Achilles tendinitis, right leg: Secondary | ICD-10-CM | POA: Diagnosis not present

## 2021-03-01 DIAGNOSIS — M7731 Calcaneal spur, right foot: Secondary | ICD-10-CM | POA: Diagnosis not present

## 2021-03-01 DIAGNOSIS — M216X1 Other acquired deformities of right foot: Secondary | ICD-10-CM | POA: Diagnosis not present

## 2021-03-01 DIAGNOSIS — S86311A Strain of muscle(s) and tendon(s) of peroneal muscle group at lower leg level, right leg, initial encounter: Secondary | ICD-10-CM | POA: Diagnosis not present

## 2021-03-01 MED ORDER — ACETAMINOPHEN 500 MG PO TABS: 1000.0000 mg | ORAL_TABLET | Freq: Four times a day (QID) | ORAL | 0 refills | Status: AC | PRN

## 2021-03-01 MED ORDER — RIVAROXABAN 10 MG PO TABS: 10.0000 mg | ORAL_TABLET | Freq: Every day | ORAL | 0 refills | Status: DC

## 2021-03-01 MED ORDER — OXYCODONE HCL 5 MG PO TABS: 5.0000 mg | ORAL_TABLET | ORAL | 0 refills | Status: DC | PRN

## 2021-03-01 MED ORDER — GABAPENTIN 300 MG PO CAPS: 300.0000 mg | ORAL_CAPSULE | Freq: Three times a day (TID) | ORAL | 0 refills | Status: DC

## 2021-03-01 NOTE — Telephone Encounter (Signed)
Patient's husband is calling because he was unable to pick up prescription(Xarelto). Please advise.  Called pharmacy and they said that a prior authorization is needed and will fax over the form to be completed.

## 2021-03-04 NOTE — Telephone Encounter (Signed)
Can you complete the prior authorization form if they've faxed it?

## 2021-03-04 NOTE — Telephone Encounter (Signed)
Patient is calling because her insurance will not pay for the Xarelto until speaking with the physician.she has been taking an asp every mornings until this is resolved.Called pharmacy and a prior authorization is being requested. Please advise.

## 2021-03-06 NOTE — Telephone Encounter (Signed)
Called pharmacy and they are faxing over authorization form to be completed.

## 2021-03-07 ENCOUNTER — Ambulatory Visit (INDEPENDENT_AMBULATORY_CARE_PROVIDER_SITE_OTHER): Payer: Managed Care, Other (non HMO) | Admitting: Podiatry

## 2021-03-07 ENCOUNTER — Ambulatory Visit (INDEPENDENT_AMBULATORY_CARE_PROVIDER_SITE_OTHER): Payer: Managed Care, Other (non HMO)

## 2021-03-07 ENCOUNTER — Other Ambulatory Visit: Payer: Self-pay

## 2021-03-07 DIAGNOSIS — M6528 Calcific tendinitis, other site: Secondary | ICD-10-CM

## 2021-03-07 DIAGNOSIS — S86311A Strain of muscle(s) and tendon(s) of peroneal muscle group at lower leg level, right leg, initial encounter: Secondary | ICD-10-CM

## 2021-03-07 DIAGNOSIS — M21861 Other specified acquired deformities of right lower leg: Secondary | ICD-10-CM

## 2021-03-07 DIAGNOSIS — M9261 Juvenile osteochondrosis of tarsus, right ankle: Secondary | ICD-10-CM

## 2021-03-07 MED ORDER — OXYCODONE HCL 5 MG PO TABS: 5.0000 mg | ORAL_TABLET | ORAL | 0 refills | Status: AC | PRN

## 2021-03-07 NOTE — Progress Notes (Signed)
  Subjective:  Patient ID: [name], female    DOB: [birth year],  MRN: [id number]  Chief Complaint  Patient presents with   Routine Post Op      POV #1 DOS 03/01/2021 REMOVAL OF HEEL SPUR, REPAIR ACHILLES, LENGTHENING OF CALF MUSCLE, REPAIR OF PERONEAL TENDONS, RT FOOT & ANKLE     60 y.o. female returns for post-op check.  Doing well pain continues to improve cast fits well and is not painful  Review of Systems: Negative except as noted in the HPI. Denies N/V/F/Ch.   Objective:  There were no vitals filed for this visit. There is no height or weight on file to calculate BMI. Constitutional Well developed. Well nourished.  Vascular Foot warm and well perfused. Capillary refill normal to all digits.   Neurologic Normal speech. Oriented to person, place, and time. Epicritic sensation to light touch grossly present bilaterally.  Dermatologic Cast is clean dry and intact  Orthopedic: Tenderness to palpation noted about the surgical site.   Multiple view plain film radiographs: Excellent resection of heel spur Assessment:   1. Calcific Achilles tendinitis of right lower extremity   2. Peroneal tendon tear, right, initial encounter   3. Gastrocnemius equinus of right lower extremity   4. Haglund's deformity of right heel    Plan:  Patient was evaluated and treated and all questions answered.  S/p foot surgery right -Progressing as expected post-operatively.  I reviewed the intraoperative findings with her regarding the tendon and the heel spur -XR: As above -WB Status: NWB in short leg cast -Sutures: Removed at next visit and cast change. -Medications: Still awaiting Xarelto prior Auth approval.  For now twice daily 81 mg aspirin.  Oxycodone prescription refilled   Return in about 2 weeks (around 03/21/2021) for post op (no x-rays), suture removal, cast change .

## 2021-03-21 ENCOUNTER — Other Ambulatory Visit: Payer: Self-pay

## 2021-03-21 ENCOUNTER — Ambulatory Visit (INDEPENDENT_AMBULATORY_CARE_PROVIDER_SITE_OTHER): Payer: Managed Care, Other (non HMO) | Admitting: Podiatry

## 2021-03-21 DIAGNOSIS — M7661 Achilles tendinitis, right leg: Secondary | ICD-10-CM

## 2021-03-21 DIAGNOSIS — M9261 Juvenile osteochondrosis of tarsus, right ankle: Secondary | ICD-10-CM

## 2021-03-21 DIAGNOSIS — S86311A Strain of muscle(s) and tendon(s) of peroneal muscle group at lower leg level, right leg, initial encounter: Secondary | ICD-10-CM

## 2021-03-21 DIAGNOSIS — M6528 Calcific tendinitis, other site: Secondary | ICD-10-CM

## 2021-03-21 DIAGNOSIS — T8189XA Other complications of procedures, not elsewhere classified, initial encounter: Secondary | ICD-10-CM

## 2021-03-21 DIAGNOSIS — M21861 Other specified acquired deformities of right lower leg: Secondary | ICD-10-CM

## 2021-03-21 DIAGNOSIS — M62461 Contracture of muscle, right lower leg: Secondary | ICD-10-CM

## 2021-03-21 MED ORDER — MUPIROCIN 2 % EX OINT: 1.0000 | TOPICAL_OINTMENT | Freq: Every day | CUTANEOUS | 2 refills | Status: DC

## 2021-03-21 MED ORDER — GABAPENTIN 300 MG PO CAPS: 300.0000 mg | ORAL_CAPSULE | Freq: Three times a day (TID) | ORAL | 0 refills | Status: DC

## 2021-03-21 MED ORDER — CEPHALEXIN 500 MG PO CAPS: 500.0000 mg | ORAL_CAPSULE | Freq: Three times a day (TID) | ORAL | 0 refills | Status: DC

## 2021-03-25 NOTE — Progress Notes (Signed)
  Subjective:  Patient ID: [name], female    DOB: 1961-04-10,  MRN: [id number]  Chief Complaint  Patient presents with   Routine Post Op     POV #2 DOS 03/01/2021 REMOVAL OF HEEL SPUR, REPAIR ACHILLES, LENGTHENING OF CALF MUSCLE, REPAIR OF PERONEAL TENDONS, RT FOOT & ANKLE     60 y.o. female returns for post-op check.  Doing well pain continues to improve cast fits well and is not painful  Review of Systems: Negative except as noted in the HPI. Denies N/V/F/Ch.   Objective:  There were no vitals filed for this visit. There is no height or weight on file to calculate BMI. Constitutional Well developed. Well nourished.  Vascular Foot warm and well perfused. Capillary refill normal to all digits.   Neurologic Normal speech. Oriented to person, place, and time. Epicritic sensation to light touch grossly present bilaterally.  Dermatologic Cast is clean dry and intact.  Central area of delayed healing in the posterior heel incision.  Some erythema around the perineal incision  Orthopedic: Tenderness to palpation noted about the surgical site.   Multiple view plain film radiographs: Excellent resection of heel spur Assessment:   1. Delayed surgical wound healing, initial encounter   2. Calcific Achilles tendinitis of right lower extremity   3. Peroneal tendon tear, right, initial encounter   4. Gastrocnemius equinus of right lower extremity   5. Haglund's deformity of right heel    Plan:  Patient was evaluated and treated and all questions answered.  S/p foot surgery right -Has some delayed healing of her incision.  Discussed putting her back into a cast today but I prefer to have access for local wound care.  Put her into a cam boot with plantarflexion wedges and advised her that she should treat this in a cast and only take it off to bathe -XR: As above -WB Status: NWB in CAM boot with plantarflexion wedges -Sutures: Removed today -Medications: I refilled her  gabapentin, please route Keflex and gave her Rx for mupirocin for changes   No follow-ups on file.

## 2021-03-28 ENCOUNTER — Other Ambulatory Visit: Payer: Self-pay | Admitting: Podiatry

## 2021-04-05 ENCOUNTER — Other Ambulatory Visit: Payer: Self-pay | Admitting: Podiatry

## 2021-04-05 NOTE — Telephone Encounter (Signed)
Please advise 

## 2021-04-08 ENCOUNTER — Other Ambulatory Visit: Payer: Self-pay

## 2021-04-08 ENCOUNTER — Ambulatory Visit: Payer: Managed Care, Other (non HMO)

## 2021-04-08 ENCOUNTER — Ambulatory Visit (INDEPENDENT_AMBULATORY_CARE_PROVIDER_SITE_OTHER): Payer: Managed Care, Other (non HMO) | Admitting: Podiatry

## 2021-04-08 DIAGNOSIS — M21861 Other specified acquired deformities of right lower leg: Secondary | ICD-10-CM

## 2021-04-08 DIAGNOSIS — S86311A Strain of muscle(s) and tendon(s) of peroneal muscle group at lower leg level, right leg, initial encounter: Secondary | ICD-10-CM

## 2021-04-08 DIAGNOSIS — M6528 Calcific tendinitis, other site: Secondary | ICD-10-CM

## 2021-04-08 DIAGNOSIS — T8189XA Other complications of procedures, not elsewhere classified, initial encounter: Secondary | ICD-10-CM

## 2021-04-08 NOTE — Progress Notes (Signed)
  Subjective:  Patient ID: [name], female    DOB: 1960/08/22,  MRN: [id number]  Chief Complaint  Patient presents with   Routine Post Op     (xray)POV #3 DOS 03/01/2021 REMOVAL OF HEEL SPUR, REPAIR ACHILLES, LENGTHENING OF CALF MUSCLE, REPAIR OF PERONEAL TENDONS, RT FOOT & ANKLE     60 y.o. female returns for post-op check.  Doing well her wound continues to improve.  Review of Systems: Negative except as noted in the HPI. Denies N/V/F/Ch.   Objective:  There were no vitals filed for this visit. There is no height or weight on file to calculate BMI. Constitutional Well developed. Well nourished.  Vascular Foot warm and well perfused. Capillary refill normal to all digits.   Neurologic Normal speech. Oriented to person, place, and time. Epicritic sensation to light touch grossly present bilaterally.  Dermatologic Superficial wound dehiscence is now measuring 0.9 x 0.6 x 0.1 cm with fibrogranular wound bed.  Tender to touch.  No signs of infection no purulence no drainage.  Orthopedic: Tenderness to palpation noted about the surgical site.      Multiple view plain film radiographs: Excellent resection of heel spur Assessment:   1. Delayed surgical wound healing, initial encounter   2. Calcific Achilles tendinitis of right lower extremity   3. Gastrocnemius equinus of right lower extremity   4. Peroneal tendon tear, right, initial encounter    Plan:  Patient was evaluated and treated and all questions answered.  S/p foot surgery right -Continue NWB in CAM boot for 2 more weeks -Begin physical therapy in 2 weeks at home with [facility], we will send referral -Debrided the area of delayed healing today and applied Iodosorb ointment.  She will continue the mupirocin ointment at home.   Return in about 2 weeks (around 04/22/2021) for wound care.

## 2021-04-09 ENCOUNTER — Encounter: Payer: Managed Care, Other (non HMO) | Admitting: Podiatry

## 2021-04-22 ENCOUNTER — Encounter: Payer: Self-pay | Admitting: Podiatry

## 2021-04-22 ENCOUNTER — Telehealth: Payer: Self-pay | Admitting: Podiatry

## 2021-04-22 NOTE — Telephone Encounter (Signed)
Patient wanted to know where the PT referral was sent. No one as called her to get her scheduled. She also wanted to know if you still wanted to see her on 12./08 for her appointment.  Please advise  I see the referral but I don't see a referring provider.

## 2021-04-25 ENCOUNTER — Encounter: Payer: Managed Care, Other (non HMO) | Admitting: Podiatry

## 2021-05-02 ENCOUNTER — Other Ambulatory Visit: Payer: Self-pay

## 2021-05-02 ENCOUNTER — Ambulatory Visit (INDEPENDENT_AMBULATORY_CARE_PROVIDER_SITE_OTHER): Payer: Managed Care, Other (non HMO) | Admitting: Podiatry

## 2021-05-02 ENCOUNTER — Ambulatory Visit (INDEPENDENT_AMBULATORY_CARE_PROVIDER_SITE_OTHER): Payer: Managed Care, Other (non HMO)

## 2021-05-02 DIAGNOSIS — M6528 Calcific tendinitis, other site: Secondary | ICD-10-CM | POA: Diagnosis not present

## 2021-05-02 DIAGNOSIS — S86311A Strain of muscle(s) and tendon(s) of peroneal muscle group at lower leg level, right leg, initial encounter: Secondary | ICD-10-CM

## 2021-05-02 DIAGNOSIS — M21861 Other specified acquired deformities of right lower leg: Secondary | ICD-10-CM

## 2021-05-02 DIAGNOSIS — M9261 Juvenile osteochondrosis of tarsus, right ankle: Secondary | ICD-10-CM

## 2021-05-02 NOTE — Patient Instructions (Addendum)
EXERCISES:  RANGE OF MOTION (ROM) AND STRETCHING EXERCISES - Achilles Tendinitis  These exercises may help you when beginning to rehabilitate your injury. Your symptoms may resolve with or without further involvement from your physician, physical therapist or athletic trainer. While completing these exercises, remember:  Restoring tissue flexibility helps normal motion to return to the joints. This allows healthier, less painful movement and activity. An effective stretch should be held for at least 30 seconds. A stretch should never be painful. You should only feel a gentle lengthening or release in the stretched tissue.  STRETCH  Gastroc, Standing  Place hands on wall. Extend right / left leg, keeping the front knee somewhat bent. Slightly point your toes inward on your back foot. Keeping your right / left heel on the floor and your knee straight, shift your weight toward the wall, not allowing your back to arch. You should feel a gentle stretch in the right / left calf. Hold this position for 10 seconds. Repeat 3 times. Complete this stretch 2 times per day.  STRETCH  Soleus, Standing  Place hands on wall. Extend right / left leg, keeping the other knee somewhat bent. Slightly point your toes inward on your back foot. Keep your right / left heel on the floor, bend your back knee, and slightly shift your weight over the back leg so that you feel a gentle stretch deep in your back calf. Hold this position for 10 seconds. Repeat 3 times. Complete this stretch 2 times per day.  STRETCH  Gastrocsoleus, Standing  Note: This exercise can place a lot of stress on your foot and ankle. Please complete this exercise only if specifically instructed by your caregiver.  Place the ball of your right / left foot on a step, keeping your other foot firmly on the same step. Hold on to the wall or a rail for balance. Slowly lift your other foot, allowing your body weight to press your heel down over  the edge of the step. You should feel a stretch in your right / left calf. Hold this position for 10 seconds. Repeat this exercise with a slight bend in your knee. Repeat 3 times. Complete this stretch 2 times per day.   STRENGTHENING EXERCISES - Achilles Tendinitis These exercises may help you when beginning to rehabilitate your injury. They may resolve your symptoms with or without further involvement from your physician, physical therapist or athletic trainer. While completing these exercises, remember:  Muscles can gain both the endurance and the strength needed for everyday activities through controlled exercises. Complete these exercises as instructed by your physician, physical therapist or athletic trainer. Progress the resistance and repetitions only as guided. You may experience muscle soreness or fatigue, but the pain or discomfort you are trying to eliminate should never worsen during these exercises. If this pain does worsen, stop and make certain you are following the directions exactly. If the pain is still present after adjustments, discontinue the exercise until you can discuss the trouble with your clinician.  STRENGTH - Plantar-flexors  Sit with your right / left leg extended. Holding onto both ends of a rubber exercise band/tubing, loop it around the ball of your foot. Keep a slight tension in the band. Slowly push your toes away from you, pointing them downward. Hold this position for 10 seconds. Return slowly, controlling the tension in the band/tubing. Repeat 3 times. Complete this exercise 2 times per day.   STRENGTH - Plantar-flexors  Stand with your feet shoulder  width apart. Steady yourself with a wall or table using as little support as needed. Keeping your weight evenly spread over the width of your feet, rise up on your toes.* Hold this position for 10 seconds. Repeat 3 times. Complete this exercise 2 times per day.  *If this is too easy, shift your weight toward  your right / left leg until you feel challenged. Ultimately, you may be asked to do this exercise with your right / left foot only.  STRENGTH  Plantar-flexors, Eccentric  Note: This exercise can place a lot of stress on your foot and ankle. Please complete this exercise only if specifically instructed by your caregiver.  Place the balls of your feet on a step. With your hands, use only enough support from a wall or rail to keep your balance. Keep your knees straight and rise up on your toes. Slowly shift your weight entirely to your right / left toes and pick up your opposite foot. Gently and with controlled movement, lower your weight through your right / left foot so that your heel drops below the level of the step. You will feel a slight stretch in the back of your calf at the end position. Use the healthy leg to help rise up onto the balls of both feet, then lower weight only on the right / left leg again. Build up to 15 repetitions. Then progress to 3 consecutive sets of 15 repetitions.* After completing the above exercise, complete the same exercise with a slight knee bend (about 30 degrees). Again, build up to 15 repetitions. Then progress to 3 consecutive sets of 15 repetitions.* Perform this exercise 2 times per day.  *When you easily complete 3 sets of 15, your physician, physical therapist or athletic trainer may advise you to add resistance by wearing a backpack filled with additional weight.  STRENGTH - Plantar Flexors, Seated  Sit on a chair that allows your feet to rest flat on the ground. If necessary, sit at the edge of the chair. Keeping your toes firmly on the ground, lift your right / left heel as far as you can without increasing any discomfort in your ankle. Repeat 3 times. Complete this exercise 2 times a day.

## 2021-05-02 NOTE — Progress Notes (Signed)
°  Subjective:  Patient ID: [name], female    DOB: 03-19-1961,  MRN: [id number]  Chief Complaint  Patient presents with   Routine Post Op       (xray)POV #4 DOS 03/01/2021 REMOVAL OF HEEL SPUR, REPAIR ACHILLES, LENGTHENING OF CALF MUSCLE, REPAIR OF PERONEAL TENDONS, RT FOOT & ANKLE     60 y.o. female returns for post-op check.  Doing well her wound continues to improve.  Review of Systems: Negative except as noted in the HPI. Denies N/V/F/Ch.   Objective:  There were no vitals filed for this visit. There is no height or weight on file to calculate BMI. Constitutional Well developed. Well nourished.  Vascular Foot warm and well perfused. Capillary refill normal to all digits.   Neurologic Normal speech. Oriented to person, place, and time. Epicritic sensation to light touch grossly present bilaterally.  Dermatologic Delayed wound healing has healed completely and epithelialized over  Orthopedic: Tenderness to palpation noted about the surgical site.      Multiple view plain film radiographs: No recurrence of heel spur Assessment:   1. Calcific Achilles tendinitis of right lower extremity   2. Peroneal tendon tear, right, initial encounter   3. Gastrocnemius equinus of right lower extremity   4. Haglund's deformity of right heel    Plan:  Patient was evaluated and treated and all questions answered.  S/p foot surgery right -Doing very well she can begin and continue WBAT in the cam boot.  She is doing her home therapy and I gave her exercises for this today which we reviewed.  I will see her back in 3 weeks and plan to discharge her back to go back to work on January 9.   Return in about 3 weeks (around 05/23/2021) for post op (no x-rays).

## 2021-05-23 ENCOUNTER — Encounter: Payer: Self-pay | Admitting: Podiatry

## 2021-05-23 ENCOUNTER — Other Ambulatory Visit: Payer: Self-pay

## 2021-05-23 ENCOUNTER — Ambulatory Visit (INDEPENDENT_AMBULATORY_CARE_PROVIDER_SITE_OTHER): Payer: Managed Care, Other (non HMO) | Admitting: Podiatry

## 2021-05-23 DIAGNOSIS — M6528 Calcific tendinitis, other site: Secondary | ICD-10-CM

## 2021-05-23 DIAGNOSIS — S86311A Strain of muscle(s) and tendon(s) of peroneal muscle group at lower leg level, right leg, initial encounter: Secondary | ICD-10-CM

## 2021-05-27 NOTE — Progress Notes (Signed)
°  Subjective:  Patient ID: [name], female    DOB: 01-07-1961,  MRN: [id number]  Chief Complaint  Patient presents with   Tendonitis    Post op right foot     61 y.o. female returns for post-op check.  Overall doing okay she still has some soreness at times and some swelling  Review of Systems: Negative except as noted in the HPI. Denies N/V/F/Ch.   Objective:  There were no vitals filed for this visit. There is no height or weight on file to calculate BMI. Constitutional Well developed. Well nourished.  Vascular Foot warm and well perfused. Capillary refill normal to all digits.   Neurologic Normal speech. Oriented to person, place, and time. Epicritic sensation to light touch grossly present bilaterally.  Dermatologic Incision is well-healed minimally hypertrophic  Orthopedic: She has mild tenderness to palpation over the incision but good 5 out of 5 strength and none in the proximal tendon      Multiple view plain film radiographs: No recurrence of heel spur Assessment:   1. Calcific Achilles tendinitis of right lower extremity   2. Peroneal tendon tear, right, initial encounter     Plan:  Patient was evaluated and treated and all questions answered.  S/p foot surgery right -Overall doing well.  I think she can return to work, advised may have increase in swelling and tenderness and this is relatively normal but should continue to improve.  If anything worsens return to see me.  Avoid impact activity for another 3 months.   Return if symptoms worsen or fail to improve.

## 2021-06-25 ENCOUNTER — Encounter: Payer: Self-pay | Admitting: Podiatry

## 2021-06-25 NOTE — Telephone Encounter (Signed)
Please advise 

## 2021-07-01 ENCOUNTER — Encounter: Payer: Self-pay | Admitting: Podiatry

## 2021-07-02 MED ORDER — GABAPENTIN 300 MG PO CAPS: 300.0000 mg | ORAL_CAPSULE | Freq: Three times a day (TID) | ORAL | 0 refills | Status: DC

## 2021-07-02 MED ORDER — MELOXICAM 15 MG PO TABS: 15.0000 mg | ORAL_TABLET | Freq: Every day | ORAL | 3 refills | Status: DC

## 2021-08-01 ENCOUNTER — Other Ambulatory Visit: Payer: Self-pay | Admitting: Podiatry

## 2021-08-06 ENCOUNTER — Other Ambulatory Visit: Payer: Self-pay

## 2021-08-06 ENCOUNTER — Ambulatory Visit (INDEPENDENT_AMBULATORY_CARE_PROVIDER_SITE_OTHER): Payer: Managed Care, Other (non HMO)

## 2021-08-06 ENCOUNTER — Ambulatory Visit: Payer: Managed Care, Other (non HMO) | Admitting: Podiatry

## 2021-08-06 DIAGNOSIS — S86011D Strain of right Achilles tendon, subsequent encounter: Secondary | ICD-10-CM

## 2021-08-06 DIAGNOSIS — M6528 Calcific tendinitis, other site: Secondary | ICD-10-CM

## 2021-08-06 MED ORDER — METHYLPREDNISOLONE 4 MG PO TBPK: ORAL_TABLET | ORAL | 0 refills | Status: DC

## 2021-08-07 ENCOUNTER — Ambulatory Visit
Admission: RE | Admit: 2021-08-07 | Discharge: 2021-08-07 | Disposition: A | Discharge status: Home / Self Care | Payer: Managed Care, Other (non HMO) | Source: Ambulatory Visit | Attending: Podiatry | Admitting: Podiatry

## 2021-08-07 DIAGNOSIS — S86011D Strain of right Achilles tendon, subsequent encounter: Secondary | ICD-10-CM

## 2021-08-07 DIAGNOSIS — M6528 Calcific tendinitis, other site: Secondary | ICD-10-CM

## 2021-08-07 DIAGNOSIS — M7661 Achilles tendinitis, right leg: Secondary | ICD-10-CM

## 2021-08-07 NOTE — Progress Notes (Signed)
?  Subjective:  ?Patient ID: [name], female    DOB: April 15, 1961,  MRN: [id number] ? ?Chief Complaint  ?Patient presents with  ? Tendonitis  ?   Rt foot pain, swollen  ? ? ? ?61 y.o. female returns for post-op check.  Since last visit has worsened, she says he never really got to 100% for her, she notices being more more swollen recently ? ?Review of Systems: Negative except as noted in the HPI. Denies N/V/F/Ch. ? ? ?Objective:  ?There were no vitals filed for this visit. ?There is no height or weight on file to calculate BMI. ?Constitutional Well developed. ?Well nourished.  ?Vascular Foot warm and well perfused. ?Capillary refill normal to all digits.   ?Neurologic Normal speech. ?Oriented to person, place, and time. ?Epicritic sensation to light touch grossly present bilaterally.  ?Dermatologic Incision is well-healed minimally hypertrophic, there is tenderness here  ?Orthopedic: Edema and swelling in the proximal tendon prior to the insertion, pain here and she is limited in range of motion and strength  ? ? ? ? ?Multiple view plain film radiographs: Visible swelling of soft tissue in the mid substance of the Achilles tendon, no visible bony complication in the calcaneus ?Assessment:  ? ?1. Calcific Achilles tendinitis of right lower extremity   ?2. Partial tear of right Achilles tendon, subsequent encounter   ? ? ?Plan:  ?Patient was evaluated and treated and all questions answered. ? ?S/p foot surgery right ?-Unfortunately has worsened I recommend she go back into the cam boot and I placed her on a methylprednisolone taper as well as meloxicam 15 mg daily.  I do have some concern that there is interstitial tearing or other issues with the tendon repair.  I recommend a new MRI to evaluate for this and to help US guide further treatment planning including surgical revision if necessary.  I will see her back after the MRI ? ? ?Return in about 3 weeks (around 08/27/2021) for after MRI to review, re-check  Achilles tendon.  ? ?

## 2021-08-09 ENCOUNTER — Encounter: Payer: Self-pay | Admitting: Podiatry

## 2021-08-12 ENCOUNTER — Encounter: Payer: Self-pay | Admitting: Podiatry

## 2021-08-12 NOTE — Telephone Encounter (Signed)
Please advise 

## 2021-08-13 ENCOUNTER — Other Ambulatory Visit: Payer: Self-pay

## 2021-08-13 ENCOUNTER — Ambulatory Visit: Payer: Managed Care, Other (non HMO) | Admitting: Podiatry

## 2021-08-13 DIAGNOSIS — S86011D Strain of right Achilles tendon, subsequent encounter: Secondary | ICD-10-CM | POA: Diagnosis not present

## 2021-08-14 ENCOUNTER — Ambulatory Visit: Payer: Managed Care, Other (non HMO) | Admitting: Podiatry

## 2021-08-16 NOTE — Progress Notes (Signed)
She returns today for an urgent visit the boot was very painful, I suspect it may have been rubbing on the posterior plastic portion.  We discussed placing her into a nonweightbearing cast for 2 weeks and then transition back to weightbearing in the boot with Achilles wedges.  If that is not tolerated we will stick with a cast for the 2 weeks after that.  Advised to take aspirin 325 mg twice daily.  We also discussed the option of Topaz and PRP if this is not improving. ?

## 2021-08-20 ENCOUNTER — Ambulatory Visit (INDEPENDENT_AMBULATORY_CARE_PROVIDER_SITE_OTHER): Payer: Managed Care, Other (non HMO) | Admitting: Podiatry

## 2021-08-20 DIAGNOSIS — S86011D Strain of right Achilles tendon, subsequent encounter: Secondary | ICD-10-CM | POA: Diagnosis not present

## 2021-08-24 NOTE — Progress Notes (Signed)
She returns today earlier than expected because the cast was quite loose.  It was removed today uneventfully.  She was transition to a cam boot with plantarflexion wedges.  Pain is improving.  A new CAM boot was dispensed today her last CAM boot is defective and the straps no longer work properly.  I will see her back in a few weeks for re evaluation ?

## 2021-08-27 ENCOUNTER — Ambulatory Visit: Payer: Managed Care, Other (non HMO) | Admitting: Podiatry

## 2021-08-30 IMAGING — DX DG CHEST 1V PORT
1 series · 1 of 1 positions shown · non-contrast
Comparison: None.

CLINICAL DATA: Status post cystoscopy.

EXAM:
PORTABLE CHEST 1 VIEW

[chest ap]
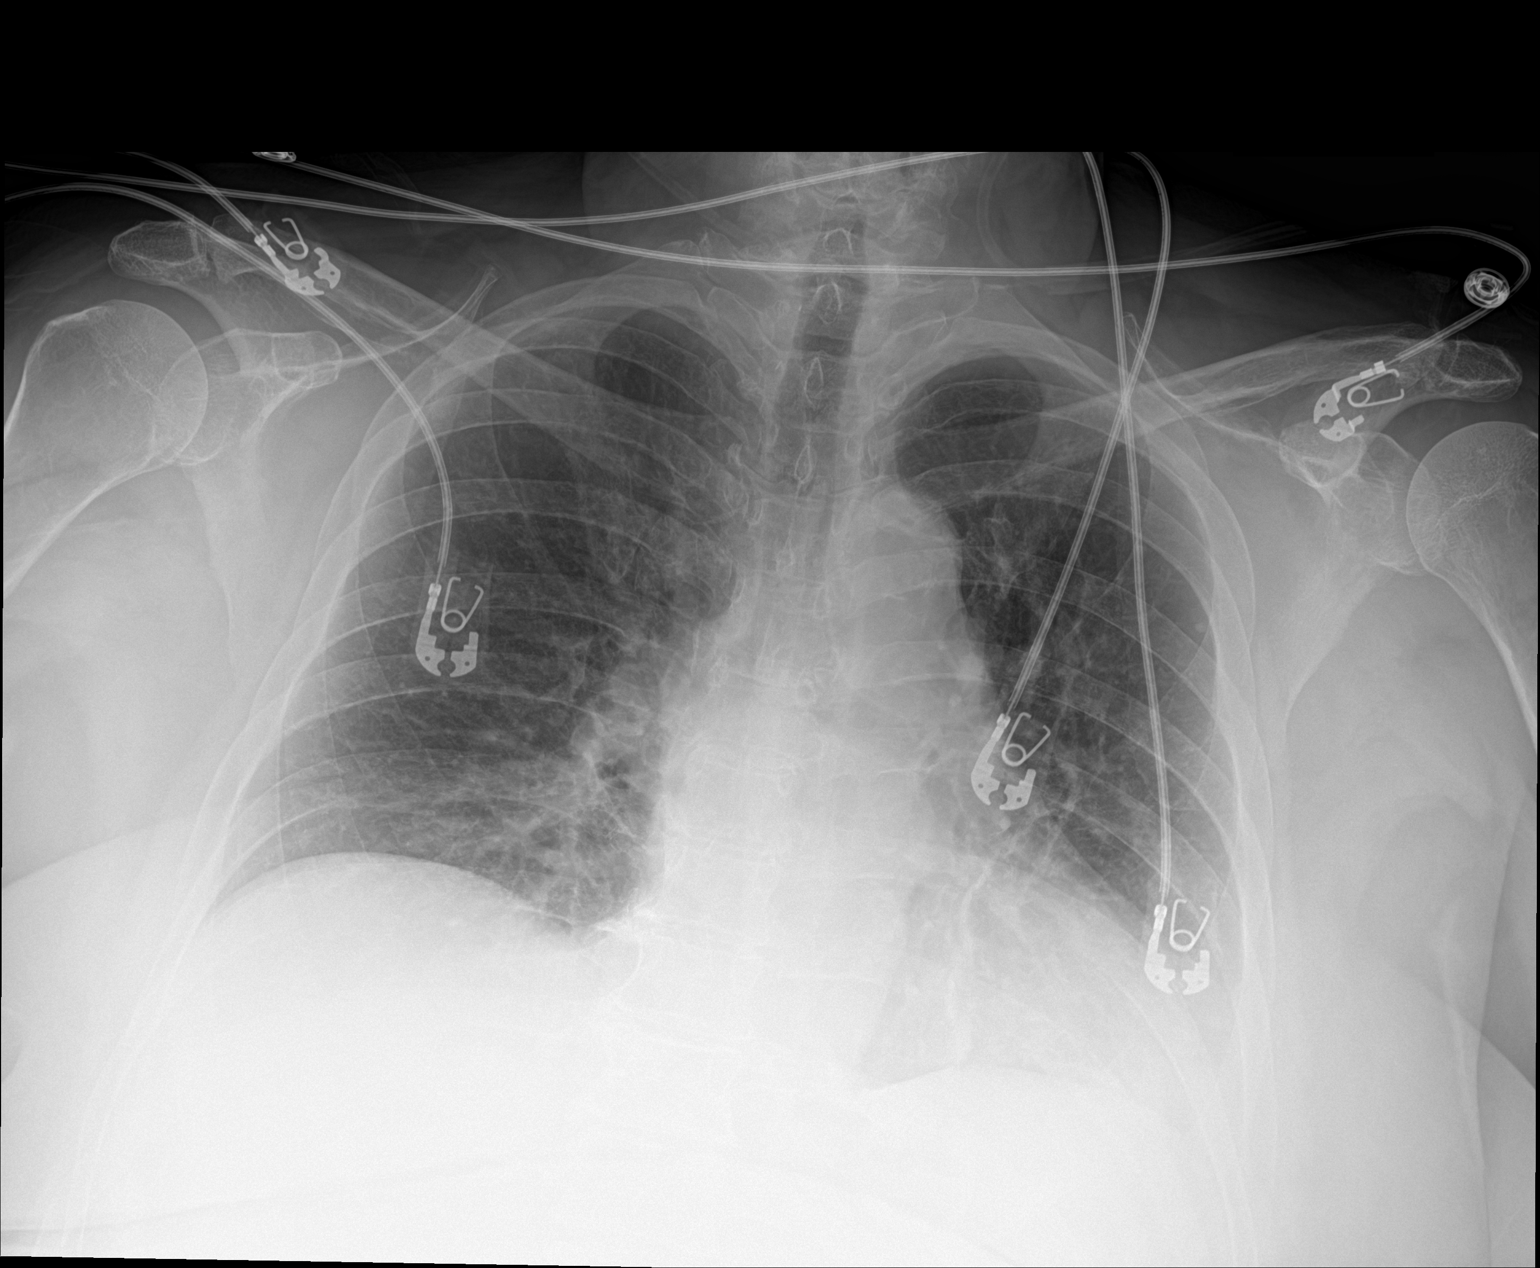

[1 of 1 positions shown; findings below may reference images not displayed]

FINDINGS: The heart size and mediastinal contours are within normal limits.
Low lung volumes. Linear left basilar opacities. No visible pleural
effusions or pneumothorax. The visualized skeletal structures are
unremarkable.
IMPRESSION: Low lung volumes with linear left opacities, favor atelectasis.
Aspiration and/or pneumonia is not excluded.

## 2021-09-04 ENCOUNTER — Other Ambulatory Visit: Payer: Self-pay | Admitting: Podiatry

## 2021-09-05 NOTE — Telephone Encounter (Signed)
Please advise 

## 2021-09-10 ENCOUNTER — Ambulatory Visit (INDEPENDENT_AMBULATORY_CARE_PROVIDER_SITE_OTHER): Payer: Managed Care, Other (non HMO) | Admitting: Podiatry

## 2021-09-10 DIAGNOSIS — S86011D Strain of right Achilles tendon, subsequent encounter: Secondary | ICD-10-CM

## 2021-09-10 NOTE — Patient Instructions (Addendum)
In 2 weeks remove 1 wedge from the boot ?In 2 more weeks remove 1 more wedge from the boot  ? ?Call [phone] to schedule PT ? ? ?[address] [place], [place] [address] ? ?

## 2021-09-13 NOTE — Progress Notes (Signed)
?  Subjective:  ?Patient ID: [name], female    DOB: [birth year],  MRN: [id number] ? ?Chief Complaint  ?Patient presents with  ? Tendonitis  ?    re-check Achilles tendon right  ? ? ? ?61 y.o. female returns for follow-up on her right Achilles tendon she is doing much better probably about 50%.  The boot has been helpful ? ?Review of Systems: Negative except as noted in the HPI. Denies N/V/F/Ch. ? ? ?Objective:  ?There were no vitals filed for this visit. ?There is no height or weight on file to calculate BMI. ?Constitutional Well developed. ?Well nourished.  ?Vascular Foot warm and well perfused. ?Capillary refill normal to all digits.   ?Neurologic Normal speech. ?Oriented to person, place, and time. ?Epicritic sensation to light touch grossly present bilaterally.  ?Dermatologic Incision is well-healed minimally hypertrophic, there is tenderness here  ?Orthopedic: Slight improvement still tenderness near the insertion proximal to the the calcaneus good 5 out of 5 strength no discontinuity of the tendon  ? ? ? ? ?Multiple view plain film radiographs: Visible swelling of soft tissue in the mid substance of the Achilles tendon, no visible bony complication in the calcaneus ?Assessment:  ? ?1. Partial tear of right Achilles tendon, subsequent encounter   ? ? ?Plan:  ?Patient was evaluated and treated and all questions answered. ? ?S/p foot surgery right ?-Has had some improvement.  I recommend she begin weightbearing further with the addition of Achilles wedges.  I placed these in her boot today and she may remove 1 wedge in 2 weeks and make an additional wedge in 2 more weeks.  I also sent a referral for physical therapy and she can begin weightbearing exercise here as well.  I will see her back in 6 weeks for reevaluation.  Hopefully can transition back to shoe gear at that point ? ? ?Return in about 6 weeks (around 10/22/2021).  ? ?

## 2021-10-01 ENCOUNTER — Other Ambulatory Visit: Payer: Self-pay | Admitting: Obstetrics and Gynecology

## 2021-10-01 ENCOUNTER — Ambulatory Visit
Admission: RE | Admit: 2021-10-01 | Discharge: 2021-10-01 | Disposition: A | Discharge status: Home / Self Care | Payer: Managed Care, Other (non HMO) | Source: Ambulatory Visit | Attending: Obstetrics and Gynecology | Admitting: Obstetrics and Gynecology

## 2021-10-01 DIAGNOSIS — Z1231 Encounter for screening mammogram for malignant neoplasm of breast: Secondary | ICD-10-CM

## 2021-10-04 ENCOUNTER — Other Ambulatory Visit: Payer: Self-pay | Admitting: Podiatry

## 2021-10-22 ENCOUNTER — Ambulatory Visit: Payer: Managed Care, Other (non HMO) | Admitting: Podiatry

## 2022-03-21 NOTE — Progress Notes (Deleted)
CARDIOLOGY CONSULT NOTE       Patient ID: [name] MRN: [id number] DOB/AGE: 01-20-1961 61 y.o.  Admit date: (Not on file) Referring Physician: Deon Pilling [facility]  Primary Physician: [name], DO Primary Cardiologist: *** Reason for Consultation: ***  Active Problems:   * No active hospital problems. *   HPI:  ***  ROS All other systems reviewed and negative except as noted above  Past Medical History:  Diagnosis Date   Anxiety    Depression    Fatty liver    History of kidney stones    Hyperlipidemia    Hyperparathyroidism (HCC)    Hypertension    Nephrolithiasis    Obesity    OSA (obstructive sleep apnea) 03/09/2013   denies; does not use CPAP    Pre-diabetes    last A1C in 2015 5.9% see 01-13-14 lab epic    Sleep apnea    Vitamin D deficiency    Wound, breast    superficial appearing , reddened area to left upper breast; per patient, has been in sun recently , denies pain or drainage , "just itchy"     Family History  Problem Relation Age of Onset   COPD Mother    Hypertension Mother    Heart disease Mother    Heart attack Mother    Heart attack Father        died age 74 with the heart attack   Heart disease Father    Hypertension Sister    Hypertension Brother    Hypertension Sister    Other Son        died age 43, autopsy pending possible aneurysm   Colon cancer Neg Hx    Breast cancer Neg Hx     Social History   Socioeconomic History   Marital status: Married    Spouse name: joseph   Number of children: 3   Years of education: 12   Highest education level: Not on file  Occupational History   Occupation: [employer]  Tobacco Use   Smoking status: Every Day    Packs/day: 0.25    Years: 20.00    Total pack years: 5.00    Types: Cigarettes   Smokeless tobacco: Never  Vaping Use   Vaping Use: Never used  Substance and Sexual Activity   Alcohol use: No    Alcohol/week: 0.0 standard drinks of alcohol   Drug use: No    Sexual activity: Never  Other Topics Concern   Not on file  Social History Narrative   Not on file   Social Determinants of Health   Financial Resource Strain: Not on file  Food Insecurity: Not on file  Transportation Needs: Not on file  Physical Activity: Not on file  Stress: Not on file  Social Connections: Not on file  Intimate Partner Violence: Not on file    Past Surgical History:  Procedure Laterality Date   BACK SURGERY     BREAST LUMPECTOMY WITH RADIOACTIVE SEED LOCALIZATION Right 02/05/2018   Procedure: BREAST LUMPECTOMY WITH RADIOACTIVE SEED LOCALIZATION;  Surgeon: Alphonsa Overall, MD;  Location: [place];  Service: General;  Laterality: Right;   BREATH TEK H PYLORI N/A 08/31/2014   Procedure: BREATH TEK Kandis Ban;  Surgeon: Alphonsa Overall, MD;  Location: [facility];  Service: General;  Laterality: N/A;   CYSTOSCOPY WITH STENT PLACEMENT Right 03/21/2020   Procedure: CYSTOSCOPY WITH STENT PLACEMENT;  Surgeon: Raynelle Bring, MD;  Location: [facility];  Service: Urology;  Laterality: Right;  CYSTOSCOPY/URETEROSCOPY/HOLMIUM LASER/STENT PLACEMENT Right 04/09/2020   Procedure: CYSTOSCOPY/URETEROSCOPYWITH STONE REMOVAL;  Surgeon: Raynelle Bring, MD;  Location: [facility];  Service: Urology;  Laterality: Right;   LAPAROSCOPIC GASTRIC SLEEVE RESECTION N/A 02/20/2015   Procedure: LAPAROSCOPIC GASTRIC SLEEVE RESECTION;  Surgeon: Alphonsa Overall, MD;  Location: [facility];  Service: General;  Laterality: N/A;   PARTIAL HYSTERECTOMY     sleep study  04/08/13   THYROID SURGERY     TOTAL HIP ARTHROPLASTY Right 02/27/2017   Procedure: RIGHT TOTAL HIP ARTHROPLASTY ANTERIOR APPROACH;  Surgeon: Dorna Leitz, MD;  Location: [facility];  Service: Orthopedics;  Laterality: Right;  Panel Lenght: 120 mins      Current Outpatient Medications:    amLODipine (NORVASC) 5 MG tablet, Take 5 mg by mouth daily., Disp: , Rfl:    busPIRone (BUSPAR) 7.5 MG tablet, Take 7.5 mg by mouth in the morning., Disp: , Rfl:    cephALEXin  (KEFLEX) 500 MG capsule, Take 1 capsule (500 mg total) by mouth 3 (three) times daily., Disp: 30 capsule, Rfl: 0   Cholecalciferol (VITAMIN D-3) 5000 UNITS TABS, Take 5,000 Units by mouth every morning. , Disp: , Rfl:    diclofenac Sodium (VOLTAREN) 1 % GEL, Apply 4 g topically 4 (four) times daily., Disp: 100 g, Rfl: 4   esomeprazole (NEXIUM) 20 MG capsule, Take 20 mg by mouth at bedtime., Disp: , Rfl:    gabapentin (NEURONTIN) 300 MG capsule, TAKE 1 CAPSULE BY MOUTH 3 TIMES DAILY., Disp: 90 capsule, Rfl: 0   isosorbide mononitrate (IMDUR) 30 MG 24 hr tablet, Take 0.5 tablets (15 mg total) by mouth daily., Disp: 30 tablet, Rfl: 0   meloxicam (MOBIC) 15 MG tablet, Take 1 tablet (15 mg total) by mouth daily., Disp: 30 tablet, Rfl: 3   methocarbamol (ROBAXIN) 500 MG tablet, Take 500 mg by mouth every 8 (eight) hours as needed for muscle spasms., Disp: , Rfl:    methylPREDNISolone (MEDROL DOSEPAK) 4 MG TBPK tablet, 6 day dose pack - take as directed, Disp: 21 tablet, Rfl: 0   mupirocin ointment (BACTROBAN) 2 %, Apply 1 application topically daily., Disp: 30 g, Rfl: 2   venlafaxine XR (EFFEXOR-XR) 75 MG 24 hr capsule, Take 75 mg by mouth in the morning., Disp: , Rfl: 12   zolpidem (AMBIEN) 5 MG tablet, Take 5 mg by mouth at bedtime as needed for sleep., Disp: , Rfl:     Physical Exam: There were no vitals taken for this visit. *** HELP TEXT ***  This SmartLink requires parameters. Parameters are variables that are added to the [facility] name to request specific information. The parameter for .curwt is the number of encounters to display readings from.  For example: .curwt[4  In this example, the SmartLink displays readings from the last four encounters.    {physical [id number]}  Labs:   Lab Results  Component Value Date   WBC 6.8 12/21/2020   HGB 12.6 12/21/2020   HCT 41.0 12/21/2020   MCV 90.1 12/21/2020   PLT 259 12/21/2020   No results for input(s): "NA", "K", "CL", "CO2", "BUN",  "CREATININE", "CALCIUM", "PROT", "BILITOT", "ALKPHOS", "ALT", "AST", "GLUCOSE" in the last 168 hours.  Invalid input(s): "LABALBU" Lab Results  Component Value Date   TROPONINI <0.30 01/13/2014    Lab Results  Component Value Date   CHOL 168 12/21/2020   CHOL 182 01/13/2014   Lab Results  Component Value Date   HDL 45 12/21/2020   HDL 35 (L) 01/13/2014   Lab Results  Component Value Date   LDLCALC 101 (H) 12/21/2020   LDLCALC 125 (H) 01/13/2014   Lab Results  Component Value Date   TRIG 108 12/21/2020   TRIG 108 01/13/2014   Lab Results  Component Value Date   CHOLHDL 3.7 12/21/2020   CHOLHDL 5.2 01/13/2014   No results found for: "LDLDIRECT"    Radiology: No results found.  EKG: ***   ASSESSMENT AND PLAN:    Signed: Jenkins Rouge 03/21/2022, 5:22 PM

## 2022-03-31 ENCOUNTER — Ambulatory Visit: Payer: Managed Care, Other (non HMO) | Attending: General Practice | Admitting: General Practice

## 2022-03-31 NOTE — Progress Notes (Deleted)
Cardiology Office Note:    Date:  03/31/2022   ID:  [name], DOB 08/04/1960, MRN [id number]  PCP:  [name], [place] [facility] Cardiologist:  Elouise Munroe, MD {   Referring MD: Deon Pilling, NP   No chief complaint on file. ***  History of Present Illness:    [name] is a 61 y.o. female with a hx of hypertension, hyperlipidemia, GERD, anxiety, depression, hyperparathyroidism, current tobacco use.   Patient was admitted to the hospital in 2015 with complaints of chest pain that occurred during an argument.  EKG and troponin were reassuring.  Underwent nuclear stress test on 01/13/2014 that showed no reversible ischemia or infarction, EF estimated 77%.  She did not follow-up with cardiology.  Echocardiogram 12/20/2015 showed EF 60-65%, no regional wall motion abnormalities, no evidence of elevated LV filling pressure.  Patient was again seen by cardiology on 12/21/2020.  At that time, patient had presented to the [facility] emergency department complaining of intermittent chest pain that had been going on for the past 2 weeks.  EKG was without acute ischemic changes.  High sensitive troponin was negative x2.  Patient underwent coronary CT on 12/21/2020 that showed minimal CAD, coronaries calcium score 5 (70th percentile).  Echocardiogram in 12/21/2020 showed EF 65-70%, no regional wall motion abnormalities, grade 1 diastolic dysfunction, normal RV systolic function.    Past Medical History:  Diagnosis Date   Anxiety    Depression    Fatty liver    History of kidney stones    Hyperlipidemia    Hyperparathyroidism ([place])    Hypertension    Nephrolithiasis    Obesity    OSA (obstructive sleep apnea) 03/09/2013   denies; does not use CPAP    Pre-diabetes    last A1C in 2015 5.9% see 01-13-14 lab epic    Sleep apnea    Vitamin D deficiency    Wound, breast    superficial appearing , reddened area to left upper breast; per patient, has been in  sun recently , denies pain or drainage , "just itchy"     Past Surgical History:  Procedure Laterality Date   BACK SURGERY     BREAST LUMPECTOMY WITH RADIOACTIVE SEED LOCALIZATION Right 02/05/2018   Procedure: BREAST LUMPECTOMY WITH RADIOACTIVE SEED LOCALIZATION;  Surgeon: Alphonsa Overall, MD;  Location: [place];  Service: General;  Laterality: Right;   BREATH TEK H PYLORI N/A 08/31/2014   Procedure: [place];  Surgeon: Alphonsa Overall, MD;  Location: [facility];  Service: General;  Laterality: N/A;   CYSTOSCOPY WITH STENT PLACEMENT Right 03/21/2020   Procedure: CYSTOSCOPY WITH STENT PLACEMENT;  Surgeon: Raynelle Bring, MD;  Location: [facility];  Service: Urology;  Laterality: Right;   CYSTOSCOPY/URETEROSCOPY/HOLMIUM LASER/STENT PLACEMENT Right 04/09/2020   Procedure: CYSTOSCOPY/URETEROSCOPYWITH STONE REMOVAL;  Surgeon: Raynelle Bring, MD;  Location: [facility];  Service: Urology;  Laterality: Right;   LAPAROSCOPIC GASTRIC SLEEVE RESECTION N/A 02/20/2015   Procedure: LAPAROSCOPIC GASTRIC SLEEVE RESECTION;  Surgeon: Alphonsa Overall, MD;  Location: [facility];  Service: General;  Laterality: N/A;   PARTIAL HYSTERECTOMY     sleep study  04/08/13   THYROID SURGERY     TOTAL HIP ARTHROPLASTY Right 02/27/2017   Procedure: RIGHT TOTAL HIP ARTHROPLASTY ANTERIOR APPROACH;  Surgeon: Dorna Leitz, MD;  Location: [facility];  Service: Orthopedics;  Laterality: Right;  Panel Lenght: 120 mins    Current Medications: No outpatient medications have been marked as taking for  the 03/31/22 encounter (Appointment) with Deberah Pelton, NP.     Allergies:   Cortisone, Aspirin, Lisinopril, Hydrocodone, and Sulfa antibiotics   Social History   Socioeconomic History   Marital status: Married    Spouse name: joseph   Number of children: 3   Years of education: 12   Highest education level: Not on file  Occupational History   Occupation: [employer]  Tobacco Use   Smoking status: Every Day    Packs/day: 0.25     Years: 20.00    Total pack years: 5.00    Types: Cigarettes   Smokeless tobacco: Never  Vaping Use   Vaping Use: Never used  Substance and Sexual Activity   Alcohol use: No    Alcohol/week: 0.0 standard drinks of alcohol   Drug use: No   Sexual activity: Never  Other Topics Concern   Not on file  Social History Narrative   Not on file   Social Determinants of Health   Financial Resource Strain: Not on file  Food Insecurity: Not on file  Transportation Needs: Not on file  Physical Activity: Not on file  Stress: Not on file  Social Connections: Not on file     Family History: The patient's family history includes COPD in her mother; Heart attack in her father and mother; Heart disease in her father and mother; Hypertension in her brother, mother, sister, and sister; Other in her son. There is no history of Colon cancer or Breast cancer.  ROS:   Please see the history of present illness.    *** All other systems reviewed and are negative.  EKGs/Labs/Other Studies Reviewed:    The following studies were reviewed today:  Coronary CT 12/21/2020  IMPRESSION: 1. Minimal CAD, CADRADS = 1.   2. Coronary calcium score is 5, which places the patient in the 70th percentile for age and sex matched control.   3. Normal coronary origin with co-dominance.   4. Mildly dilated main pulmonary artery, may indicated increased pulmonary artery pressure.   Recommendations: CAD-RADS 1. Minimal non-obstructive CAD (0-24%). Consider non-atherosclerotic causes of chest pain. Consider preventive therapy and risk factor modification.  Echocardiogram 12/21/2020 1. Left ventricular ejection fraction, by estimation, is 65 to 70%. The  left ventricle has normal function. The left ventricle has no regional  wall motion abnormalities. Left ventricular diastolic parameters are  consistent with Grade I diastolic  dysfunction (impaired relaxation).   2. Right ventricular systolic function is  normal. The right ventricular  size is normal. Tricuspid regurgitation signal is inadequate for assessing  PA pressure.   3. The mitral valve is normal in structure. No evidence of mitral valve  regurgitation. No evidence of mitral stenosis.   4. The aortic valve is normal in structure. Aortic valve regurgitation is  not visualized. No aortic stenosis is present.   5. Increased flow velocities may be secondary to anemia, thyrotoxicosis,  hyperdynamic or high flow state.    EKG:  EKG is *** ordered today.  The ekg ordered today demonstrates ***  Recent Labs: No results found for requested labs within last 365 days.  Recent Lipid Panel    Component Value Date/Time   CHOL 168 12/21/2020 0906   TRIG 108 12/21/2020 0906   HDL 45 12/21/2020 0906   CHOLHDL 3.7 12/21/2020 0906   VLDL 22 12/21/2020 0906   LDLCALC 101 (H) 12/21/2020 0906     Risk Assessment/Calculations:   {Does this patient have ATRIAL FIBRILLATION?[phone]}  No BP recorded.  {  Refresh Note OR Click here to enter BP  :1}***         Physical Exam:    VS:  There were no vitals taken for this visit.    Wt Readings from Last 3 Encounters:  12/20/20 210 lb (95.3 kg)  04/09/20 212 lb (96.2 kg)  04/04/20 212 lb (96.2 kg)     GEN: *** Well nourished, well developed in no acute distress HEENT: Normal NECK: No JVD; No carotid bruits LYMPHATICS: No lymphadenopathy CARDIAC: ***RRR, no murmurs, rubs, gallops RESPIRATORY:  Clear to auscultation without rales, wheezing or rhonchi  ABDOMEN: Soft, non-tender, non-distended MUSCULOSKELETAL:  No edema; No deformity  SKIN: Warm and dry NEUROLOGIC:  Alert and oriented x 3 PSYCHIATRIC:  Normal affect   ASSESSMENT:    No diagnosis found. PLAN:    In order of problems listed above:  ***      {Are you ordering a CV Procedure (e.g. stress test, cath, DCCV, TEE, etc)?   Press F2        :[id number]}    Medication Adjustments/Labs and Tests Ordered: Current  medicines are reviewed at length with the patient today.  Concerns regarding medicines are outlined above.  No orders of the defined types were placed in this encounter.  No orders of the defined types were placed in this encounter.   There are no Patient Instructions on file for this visit.   Signed, Margie Billet, PA-C  03/31/2022 9:08 AM    [place]

## 2022-04-03 ENCOUNTER — Ambulatory Visit: Payer: Managed Care, Other (non HMO) | Admitting: Cardiovascular Disease

## 2022-04-23 ENCOUNTER — Encounter: Payer: Self-pay | Admitting: General Practice

## 2022-07-03 ENCOUNTER — Other Ambulatory Visit: Payer: Self-pay

## 2022-07-03 MED ORDER — SEMAGLUTIDE-WEIGHT MANAGEMENT 0.25 MG/0.5ML ~~LOC~~ SOAJ
0.2500 mg | SUBCUTANEOUS | 0 refills | Status: DC
  Filled 2022-07-04 (×2): qty 2, 28d supply, fill #0

## 2022-07-03 MED ORDER — SEMAGLUTIDE-WEIGHT MANAGEMENT 0.5 MG/0.5ML ~~LOC~~ SOAJ: 0.5000 mg | SUBCUTANEOUS | 0 refills | Status: DC

## 2022-07-04 ENCOUNTER — Other Ambulatory Visit: Payer: Self-pay

## 2022-07-09 ENCOUNTER — Other Ambulatory Visit: Payer: Self-pay

## 2022-07-09 ENCOUNTER — Other Ambulatory Visit (HOSPITAL_COMMUNITY): Payer: Self-pay

## 2022-07-09 MED ORDER — ZEPBOUND 5 MG/0.5ML ~~LOC~~ SOAJ
5.0000 mg | SUBCUTANEOUS | 1 refills | Status: DC
  Filled 2022-07-09: qty 2, 28d supply, fill #0
  Filled 2022-07-10: qty 8, 112d supply, fill #0
  Filled 2022-07-10: qty 2, 28d supply, fill #0

## 2022-07-10 ENCOUNTER — Other Ambulatory Visit: Payer: Self-pay

## 2022-07-10 ENCOUNTER — Other Ambulatory Visit (HOSPITAL_COMMUNITY): Payer: Self-pay

## 2022-07-10 ENCOUNTER — Other Ambulatory Visit (HOSPITAL_BASED_OUTPATIENT_CLINIC_OR_DEPARTMENT_OTHER): Payer: Self-pay

## 2022-07-10 MED ORDER — TIRZEPATIDE-WEIGHT MANAGEMENT 5 MG/0.5ML ~~LOC~~ SOAJ
5.0000 mg | SUBCUTANEOUS | 1 refills | Status: DC
  Filled 2022-07-10: qty 2, 28d supply, fill #0

## 2022-07-11 ENCOUNTER — Other Ambulatory Visit: Payer: Self-pay

## 2022-07-14 ENCOUNTER — Other Ambulatory Visit: Payer: Self-pay

## 2022-07-14 MED ORDER — ONDANSETRON HCL 4 MG PO TABS
4.0000 mg | ORAL_TABLET | Freq: Four times a day (QID) | ORAL | 0 refills | Status: DC | PRN
  Filled 2022-07-14: qty 20, 5d supply, fill #0

## 2022-07-18 ENCOUNTER — Other Ambulatory Visit: Payer: Self-pay

## 2022-07-28 ENCOUNTER — Other Ambulatory Visit: Payer: Self-pay

## 2022-07-28 MED ORDER — PROMETHAZINE HCL 25 MG PO TABS
25.0000 mg | ORAL_TABLET | Freq: Four times a day (QID) | ORAL | 0 refills | Status: DC | PRN
  Filled 2022-07-28: qty 24, 6d supply, fill #0

## 2022-07-29 ENCOUNTER — Other Ambulatory Visit: Payer: Self-pay

## 2022-08-14 ENCOUNTER — Other Ambulatory Visit: Payer: Self-pay

## 2022-08-14 MED ORDER — ZEPBOUND 7.5 MG/0.5ML ~~LOC~~ SOAJ
SUBCUTANEOUS | 1 refills | Status: DC
  Filled 2022-08-14 (×2): qty 2, 28d supply, fill #0

## 2022-08-14 MED ORDER — ZEPBOUND 7.5 MG/0.5ML ~~LOC~~ SOAJ
7.5000 mg | SUBCUTANEOUS | 1 refills | Status: DC
  Filled 2022-08-14: qty 2, 28d supply, fill #0
  Filled 2022-12-09: qty 2, 28d supply, fill #1

## 2022-08-18 ENCOUNTER — Other Ambulatory Visit: Payer: Self-pay

## 2022-08-21 ENCOUNTER — Other Ambulatory Visit: Payer: Self-pay

## 2022-08-28 ENCOUNTER — Other Ambulatory Visit: Payer: Self-pay

## 2022-09-15 ENCOUNTER — Other Ambulatory Visit: Payer: Self-pay

## 2022-09-15 MED ORDER — ZEPBOUND 10 MG/0.5ML ~~LOC~~ SOAJ
10.0000 mg | SUBCUTANEOUS | 0 refills | Status: DC
  Filled 2022-09-15 – 2022-10-14 (×3): qty 2, 28d supply, fill #0

## 2022-09-25 ENCOUNTER — Other Ambulatory Visit: Payer: Self-pay

## 2022-09-29 ENCOUNTER — Other Ambulatory Visit: Payer: Self-pay

## 2022-10-02 ENCOUNTER — Other Ambulatory Visit (HOSPITAL_BASED_OUTPATIENT_CLINIC_OR_DEPARTMENT_OTHER): Payer: Self-pay

## 2022-10-06 ENCOUNTER — Other Ambulatory Visit (HOSPITAL_BASED_OUTPATIENT_CLINIC_OR_DEPARTMENT_OTHER): Payer: Self-pay

## 2022-10-09 ENCOUNTER — Other Ambulatory Visit (HOSPITAL_BASED_OUTPATIENT_CLINIC_OR_DEPARTMENT_OTHER): Payer: Self-pay

## 2022-10-14 ENCOUNTER — Other Ambulatory Visit: Payer: Self-pay

## 2022-10-14 ENCOUNTER — Other Ambulatory Visit (HOSPITAL_BASED_OUTPATIENT_CLINIC_OR_DEPARTMENT_OTHER): Payer: Self-pay

## 2022-10-15 ENCOUNTER — Other Ambulatory Visit: Payer: Self-pay

## 2022-10-15 MED ORDER — ONDANSETRON HCL 4 MG PO TABS
4.0000 mg | ORAL_TABLET | Freq: Four times a day (QID) | ORAL | 0 refills | Status: DC | PRN
  Filled 2022-10-15 – 2022-12-09 (×2): qty 20, 5d supply, fill #0

## 2022-10-21 ENCOUNTER — Other Ambulatory Visit: Payer: Self-pay

## 2022-10-22 ENCOUNTER — Other Ambulatory Visit: Payer: Self-pay

## 2022-12-09 ENCOUNTER — Other Ambulatory Visit: Payer: Self-pay

## 2022-12-16 ENCOUNTER — Other Ambulatory Visit: Payer: Self-pay

## 2022-12-23 ENCOUNTER — Other Ambulatory Visit (HOSPITAL_BASED_OUTPATIENT_CLINIC_OR_DEPARTMENT_OTHER): Payer: Self-pay

## 2023-01-06 ENCOUNTER — Ambulatory Visit: Payer: Managed Care, Other (non HMO) | Admitting: Internal Medicine

## 2023-01-06 NOTE — Progress Notes (Deleted)
PMH HTN OSA GERD OA R hip Anxiety Depression HLD  PSH   MEDS   ALL   SOCHX   FAMHX  HTN: BP today ***. OP regimen is amlodipine 5 mg daily which she is*** adherent to. Plan:  Depression: PHQ-9 ***. On venlafaxine 75 mg daily which she is adherent to***. Plan:

## 2023-01-29 ENCOUNTER — Ambulatory Visit: Payer: Managed Care, Other (non HMO) | Admitting: Student

## 2023-03-26 ENCOUNTER — Emergency Department (HOSPITAL_COMMUNITY)
Admission: EM | Admit: 2023-03-26 | Discharge: 2023-03-26 | Disposition: A | Discharge status: Home / Self Care | Payer: Self-pay | Attending: Emergency Medicine | Admitting: Emergency Medicine

## 2023-03-26 ENCOUNTER — Emergency Department (HOSPITAL_COMMUNITY): Payer: Self-pay

## 2023-03-26 ENCOUNTER — Encounter (HOSPITAL_COMMUNITY): Payer: Self-pay

## 2023-03-26 DIAGNOSIS — R001 Bradycardia, unspecified: Secondary | ICD-10-CM | POA: Insufficient documentation

## 2023-03-26 DIAGNOSIS — R1032 Left lower quadrant pain: Secondary | ICD-10-CM | POA: Insufficient documentation

## 2023-03-26 DIAGNOSIS — R109 Unspecified abdominal pain: Secondary | ICD-10-CM

## 2023-03-26 LAB — COMPREHENSIVE METABOLIC PANEL
ALT: 12 U/L (ref 0–44)
AST: 27 U/L (ref 15–41)
Albumin: 3.3 g/dL — ABNORMAL LOW (ref 3.5–5.0)
Alkaline Phosphatase: 76 U/L (ref 38–126)
Anion gap: 8 (ref 5–15)
BUN: 31 mg/dL — ABNORMAL HIGH (ref 8–23)
CO2: 23 mmol/L (ref 22–32)
Calcium: 8.4 mg/dL — ABNORMAL LOW (ref 8.9–10.3)
Chloride: 109 mmol/L (ref 98–111)
Creatinine, Ser: 0.85 mg/dL (ref 0.44–1.00)
GFR, Estimated: >60 mL/min (ref >60)
Glucose, Bld: 94 mg/dL (ref 70–99)
Potassium: 4.8 mmol/L (ref 3.5–5.1)
Sodium: 140 mmol/L (ref 135–145)
Total Bilirubin: 1.6 mg/dL — ABNORMAL HIGH (ref <1.2)
Total Protein: 5.8 g/dL — ABNORMAL LOW (ref 6.5–8.1)

## 2023-03-26 LAB — CBC WITH DIFFERENTIAL/PLATELET
Abs Immature Granulocytes: 0.03 10*3/uL (ref 0.00–0.07)
Basophils Absolute: 0 10*3/uL (ref 0.0–0.1)
Basophils Relative: 1 %
Eosinophils Absolute: 0.1 10*3/uL (ref 0.0–0.5)
Eosinophils Relative: 2 %
HCT: 40.7 % (ref 36.0–46.0)
Hemoglobin: 13 g/dL (ref 12.0–15.0)
Immature Granulocytes: 1 %
Lymphocytes Relative: 30 %
Lymphs Abs: 1.7 10*3/uL (ref 0.7–4.0)
MCH: 29 pg (ref 26.0–34.0)
MCHC: 31.9 g/dL (ref 30.0–36.0)
MCV: 90.8 fL (ref 80.0–100.0)
Monocytes Absolute: 0.4 10*3/uL (ref 0.1–1.0)
Monocytes Relative: 7 %
Neutro Abs: 3.5 10*3/uL (ref 1.7–7.7)
Neutrophils Relative %: 59 %
Platelets: 205 10*3/uL (ref 150–400)
RBC: 4.48 MIL/uL (ref 3.87–5.11)
RDW: 13.3 % (ref 11.5–15.5)
WBC: 5.8 10*3/uL (ref 4.0–10.5)
nRBC: 0 % (ref 0.0–0.2)

## 2023-03-26 LAB — LIPASE, BLOOD: Lipase: 47 U/L (ref 11–51)

## 2023-03-26 LAB — URINALYSIS, W/ REFLEX TO CULTURE (INFECTION SUSPECTED)
Bilirubin Urine: NEGATIVE
Glucose, UA: NEGATIVE mg/dL
Hgb urine dipstick: NEGATIVE
Ketones, ur: NEGATIVE mg/dL
Leukocytes,Ua: NEGATIVE
Nitrite: POSITIVE — AB
Protein, ur: NEGATIVE mg/dL
Specific Gravity, Urine: 1.017 (ref 1.005–1.030)
pH: 7 (ref 5.0–8.0)

## 2023-03-26 MED ORDER — SODIUM CHLORIDE 0.9 % IV BOLUS
500.0000 mL | Freq: Once | INTRAVENOUS | Status: AC
Start: 2023-03-26 — End: 2023-03-26
  Administered 2023-03-26: 500 mL via INTRAVENOUS

## 2023-03-26 MED ORDER — ONDANSETRON 4 MG PO TBDP: 4.0000 mg | ORAL_TABLET | Freq: Three times a day (TID) | ORAL | 0 refills | Status: DC | PRN

## 2023-03-26 MED ORDER — ONDANSETRON HCL 4 MG/2ML IJ SOLN
4.0000 mg | Freq: Once | INTRAMUSCULAR | Status: AC
  Administered 2023-03-26: 4 mg via INTRAVENOUS
  Filled 2023-03-26: qty 2

## 2023-03-26 MED ORDER — DICYCLOMINE HCL 20 MG PO TABS: 20.0000 mg | ORAL_TABLET | Freq: Two times a day (BID) | ORAL | 0 refills | Status: DC

## 2023-03-26 MED ORDER — ONDANSETRON HCL 4 MG/2ML IJ SOLN
4.0000 mg | Freq: Once | INTRAMUSCULAR | Status: AC
Start: 2023-03-26 — End: 2023-03-26
  Administered 2023-03-26: 4 mg via INTRAVENOUS
  Filled 2023-03-26: qty 2

## 2023-03-26 MED ORDER — MORPHINE SULFATE (PF) 4 MG/ML IV SOLN
4.0000 mg | Freq: Once | INTRAVENOUS | Status: AC
  Administered 2023-03-26: 4 mg via INTRAVENOUS
  Filled 2023-03-26: qty 1

## 2023-03-26 MED ORDER — IOHEXOL 300 MG/ML  SOLN
100.0000 mL | Freq: Once | INTRAMUSCULAR | Status: AC | PRN
  Administered 2023-03-26: 100 mL via INTRAVENOUS

## 2023-03-26 NOTE — ED Notes (Signed)
Patient states she has been dizzy for the last PA notified

## 2023-03-26 NOTE — ED Provider Notes (Signed)
[place] EMERGENCY DEPARTMENT AT [facility] Provider Note   CSN: [id number] Arrival date & time: 03/26/23  0831     History  Chief Complaint  Patient presents with   Abdominal Pain    [name] is a 62 y.o. female.  62 year old female presents today for left-sided abdominal pain.  Has history of kidney stones.  Denies nausea and vomiting.  Denies history of dysuria, hematuria.  Started last night.  The history is provided by the patient. No language interpreter was used.       Home Medications Prior to Admission medications   Medication Sig Start Date End Date Taking? Authorizing Provider  dicyclomine (BENTYL) 20 MG tablet Take 1 tablet (20 mg total) by mouth 2 (two) times daily. 03/26/23  Yes Arcelia Pals, PA-C  ondansetron (ZOFRAN-ODT) 4 MG disintegrating tablet Take 1 tablet (4 mg total) by mouth every 8 (eight) hours as needed. 03/26/23  Yes Elycia Woodside, PA-C  amLODipine (NORVASC) 5 MG tablet Take 5 mg by mouth daily.    [provider]  busPIRone (BUSPAR) 7.5 MG tablet Take 7.5 mg by mouth in the morning.    [provider]  cephALEXin (KEFLEX) 500 MG capsule Take 1 capsule (500 mg total) by mouth 3 (three) times daily. 03/21/21   McDonald, Rachelle Hora, DPM  Cholecalciferol (VITAMIN D-3) 5000 UNITS TABS Take 5,000 Units by mouth every morning.     [provider]  diclofenac Sodium (VOLTAREN) 1 % GEL Apply 4 g topically 4 (four) times daily. 01/17/21   McDonald, Rachelle Hora, DPM  esomeprazole (NEXIUM) 20 MG capsule Take 20 mg by mouth at bedtime.    [provider]  gabapentin (NEURONTIN) 300 MG capsule TAKE 1 CAPSULE BY MOUTH 3 TIMES DAILY. 10/04/21   Louann Sjogren, DPM  isosorbide mononitrate (IMDUR) 30 MG 24 hr tablet Take 0.5 tablets (15 mg total) by mouth daily. 12/21/20   Rodolph Bong, MD  meloxicam (MOBIC) 15 MG tablet Take 1 tablet (15 mg total) by mouth daily. 07/02/21   McDonald, Rachelle Hora, DPM  methocarbamol (ROBAXIN) 500  MG tablet Take 500 mg by mouth every 8 (eight) hours as needed for muscle spasms.    [provider]  methylPREDNISolone (MEDROL DOSEPAK) 4 MG TBPK tablet 6 day dose pack - take as directed 08/06/21   Edwin Cap, DPM  mupirocin ointment (BACTROBAN) 2 % Apply 1 application topically daily. 03/21/21   McDonald, Rachelle Hora, DPM  ondansetron (ZOFRAN) 4 MG tablet Take 1 tablet (4 mg total) by mouth every 6 (six) hours as needed 1 hour prior to meal. 10/14/22     promethazine (PHENERGAN) 25 MG tablet Take 1 tablet (25 mg total) by mouth every 6 (six) hours as needed for nausea 07/28/22     Semaglutide-Weight Management 0.25 MG/0.5ML SOAJ Inject 0.25 mg into the skin once a week. 06/17/22     Semaglutide-Weight Management 0.5 MG/0.5ML SOAJ Inject 0.5 mg into the skin once a week. 06/17/22     tirzepatide (ZEPBOUND) 10 MG/0.5ML Pen Inject 10 mg into the skin once a week. 09/15/22     tirzepatide (ZEPBOUND) 5 MG/0.5ML Pen Inject 5 mg into the skin once a week. 07/08/22   [place], Velna Hatchet, MD  tirzepatide [facility]) 7.5 MG/0.5ML Pen Inject 7.5 mg into the skin once a week. 08/14/22   Salley Scarlet, MD  venlafaxine XR (EFFEXOR-XR) 75 MG 24 hr capsule Take 75 mg by mouth in the morning. 12/11/16  [provider]  zolpidem (AMBIEN) 5 MG tablet Take 5 mg by mouth at bedtime as needed for sleep. 11/29/20   [provider]      Allergies    Cortisone, Aspirin, Lisinopril, Hydrocodone, and Sulfa antibiotics    Review of Systems   Review of Systems  Constitutional:  Negative for chills and fever.  Respiratory:  Negative for shortness of breath.   Gastrointestinal:  Positive for abdominal pain. Negative for nausea and vomiting.  Genitourinary:  Positive for flank pain. Negative for dysuria and hematuria.  All other systems reviewed and are negative.   Physical Exam Updated Vital Signs BP 126/82 (BP Location: Right Arm)   Pulse (!) 53   Temp 97.6 F (36.4 C)   Resp 17   Ht 5\' 3"   (1.6 m)   Wt 85.3 kg   SpO2 97%   BMI 33.30 kg/m  Physical Exam Vitals and nursing note reviewed.  Constitutional:      General: She is not in acute distress.    Appearance: Normal appearance. She is not ill-appearing.  HENT:     Head: Normocephalic and atraumatic.     Nose: Nose normal.  Eyes:     General: No scleral icterus.    Extraocular Movements: Extraocular movements intact.     Conjunctiva/sclera: Conjunctivae normal.  Cardiovascular:     Rate and Rhythm: Normal rate and regular rhythm.     Heart sounds: Normal heart sounds.  Pulmonary:     Effort: Pulmonary effort is normal. No respiratory distress.     Breath sounds: Normal breath sounds. No wheezing or rales.  Abdominal:     General: There is no distension.     Palpations: Abdomen is soft.     Tenderness: There is abdominal tenderness. There is no right CVA tenderness, left CVA tenderness or guarding.  Musculoskeletal:        General: Normal range of motion.     Cervical back: Normal range of motion.  Skin:    General: Skin is warm and dry.  Neurological:     General: No focal deficit present.     Mental Status: She is alert. Mental status is at baseline.     ED Results / Procedures / Treatments   Labs (all labs ordered are listed, but only abnormal results are displayed) Labs Reviewed  COMPREHENSIVE METABOLIC PANEL - Abnormal; Notable for the following components:      Result Value   BUN 31 (*)    Calcium 8.4 (*)    Total Protein 5.8 (*)    Albumin 3.3 (*)    Total Bilirubin 1.6 (*)    All other components within normal limits  URINALYSIS, W/ REFLEX TO CULTURE (INFECTION SUSPECTED) - Abnormal; Notable for the following components:   Nitrite POSITIVE (*)    Bacteria, UA MANY (*)    All other components within normal limits  LIPASE, BLOOD  CBC WITH DIFFERENTIAL/PLATELET    EKG None  Radiology CT ABDOMEN PELVIS W CONTRAST  Result Date: 03/26/2023 CLINICAL DATA:  Abdominal pain EXAM: CT ABDOMEN  AND PELVIS WITH CONTRAST TECHNIQUE: Multidetector CT imaging of the abdomen and pelvis was performed using the standard protocol following bolus administration of intravenous contrast. RADIATION DOSE REDUCTION: This exam was performed according to the departmental dose-optimization program which includes automated exposure control, adjustment of the mA and/or kV according to patient size and/or use of iterative reconstruction technique. CONTRAST:  OMNIPAQUE IOHEXOL 300 MG/ML  SOLN COMPARISON:  03/21/2020 FINDINGS:  Lower chest: There is some linear opacity lung bases likely scar or atelectasis. No pleural effusion. Small hiatal hernia. Hepatobiliary: Mild fatty liver infiltration. Gallbladder has a numerous stones but is nondilated. Patent portal vein. Pancreas: Unremarkable. No pancreatic ductal dilatation or surrounding inflammatory changes. Spleen: Normal in size without focal abnormality. Adrenals/Urinary Tract: Adrenal glands are preserved. There is moderate atrophy of the left kidney. Punctate nonobstructing upper pole left-sided and midportion renal stones. No enhancing renal mass or collecting system dilatation. The ureters have normal course and caliber extending down to the urinary bladder. There are some small cystic foci along each kidney which are too small to completely characterize but likely benign cysts, Bosniak 1 and 2 lesions. No specific imaging follow-up. Contracted urinary bladder. Stomach/Bowel: Large bowel has a normal course and caliber with scattered colonic stool. Stomach and small bowel are nondilated. Surgical changes from previous gastric sleeve. The cecum resides in the central pelvis above the bladder. Surgical changes along the base of the cecum. The appendix is not seen. Please correlate with history. Vascular/Lymphatic: Aortic atherosclerosis. No enlarged abdominal or pelvic lymph nodes. Reproductive: Status post hysterectomy. No adnexal masses. Other: No abdominal wall hernia  or abnormality. No abdominopelvic ascites. Musculoskeletal: Curvature of the spine with some degenerative changes. Fixation hardware with a unilateral pedicle screws at L4-5 and prosthetic disc material. There is multilevel disc bulging along the lumbar spine with areas of stenosis. IMPRESSION: No bowel obstruction. Scattered stool. Previous gastric sleeve. Small hiatal hernia. Fatty liver infiltration. Numerous stones in the nondilated gallbladder. Left-sided renal atrophy with some punctate nonobstructing renal stones. Electronically Signed   By: Karen Kays M.D.   On: 03/26/2023 13:41    Procedures Procedures    Medications Ordered in ED Medications  morphine (PF) 4 MG/ML injection 4 mg (4 mg Intravenous Given 03/26/23 1000)  ondansetron (ZOFRAN) injection 4 mg (4 mg Intravenous Given 03/26/23 0959)  sodium chloride 0.9 % bolus 500 mL (0 mLs Intravenous Stopped 03/26/23 1334)  iohexol (OMNIPAQUE) 300 MG/ML solution 100 mL (100 mLs Intravenous Contrast Given 03/26/23 1124)  ondansetron (ZOFRAN) injection 4 mg (4 mg Intravenous Given 03/26/23 1307)  morphine (PF) 4 MG/ML injection 4 mg (4 mg Intravenous Given 03/26/23 1306)  sodium chloride 0.9 % bolus 500 mL (500 mLs Intravenous New Bag/Given 03/26/23 1316)    ED Course/ Medical Decision Making/ A&P                                 Medical Decision Making Amount and/or Complexity of Data Reviewed Labs: ordered. Radiology: ordered.  Risk Prescription drug management.   Medical Decision Making / ED Course   This patient presents to the ED for concern of abdominal pain, this involves an extensive number of treatment options, and is a complaint that carries with it a high risk of complications and morbidity.  The differential diagnosis includes nephrolithiasis, pyelonephritis, UTI, diverticulitis, colitis, gastroenteritis, constipation  MDM: 62 year old female presents with abdominal pain. No evidence of acute abdomen. CBC unremarkable.   CMP with preserved renal function, normal electrolytes.  Minimal elevation in T. bili but other LFTs normal.  No right upper quadrant abdominal pain.  Pain localized to left-sided abdomen.  UA without evidence of UTI.  No significant WBCs although nitrite positive but leukocyte negative.  Not impressive for UTI.  Given complaints of urinary frequency will send urine culture.  Lipase within normal.  CT without evidence of other acute  intra-abdominal or pelvic finding.  Symptomatic management discussed.  GI referral given.  PCP referral given.  Patient during the visit became bradycardic in the mid to upper 40s.  However she did have appropriate response to exertion.  No lightheadedness, palpitations, chest pain, shortness of breath.  Ambulated without difficulty.  EKG shows sinus bradycardia.  Discussed follow-up with PCP.   Lab Tests: -I ordered, reviewed, and interpreted labs.   The pertinent results include:   Labs Reviewed  COMPREHENSIVE METABOLIC PANEL - Abnormal; Notable for the following components:      Result Value   BUN 31 (*)    Calcium 8.4 (*)    Total Protein 5.8 (*)    Albumin 3.3 (*)    Total Bilirubin 1.6 (*)    All other components within normal limits  URINALYSIS, W/ REFLEX TO CULTURE (INFECTION SUSPECTED) - Abnormal; Notable for the following components:   Nitrite POSITIVE (*)    Bacteria, UA MANY (*)    All other components within normal limits  LIPASE, BLOOD  CBC WITH DIFFERENTIAL/PLATELET      EKG  EKG Interpretation Date/Time:    Ventricular Rate:    PR Interval:    QRS Duration:    QT Interval:    QTC Calculation:   R Axis:      Text Interpretation:           Imaging Studies ordered: I ordered imaging studies including CT abdomen pelvis with contrast I independently visualized and interpreted imaging. I agree with the radiologist interpretation   Medicines ordered and prescription drug management: Meds ordered this encounter  Medications    morphine (PF) 4 MG/ML injection 4 mg   ondansetron (ZOFRAN) injection 4 mg   sodium chloride 0.9 % bolus 500 mL   iohexol (OMNIPAQUE) 300 MG/ML solution 100 mL   ondansetron (ZOFRAN) injection 4 mg   morphine (PF) 4 MG/ML injection 4 mg   sodium chloride 0.9 % bolus 500 mL   dicyclomine (BENTYL) 20 MG tablet    Sig: Take 1 tablet (20 mg total) by mouth 2 (two) times daily.    Dispense:  20 tablet    Refill:  0    Order Specific Question:   Supervising Provider    Answer:   MILLER, BRIAN [[id number]]   ondansetron (ZOFRAN-ODT) 4 MG disintegrating tablet    Sig: Take 1 tablet (4 mg total) by mouth every 8 (eight) hours as needed.    Dispense:  20 tablet    Refill:  0    Order Specific Question:   Supervising Provider    Answer:   Hyacinth Meeker, BRIAN [[id number]]    -I have reviewed the patients home medicines and have made adjustments as needed   Reevaluation: After the interventions noted above, I reevaluated the patient and found that they have :improved  Co morbidities that complicate the patient evaluation  Past Medical History:  Diagnosis Date   Anxiety    Depression    Fatty liver    History of kidney stones    Hyperlipidemia    Hyperparathyroidism ([facility])    Hypertension    Nephrolithiasis    Obesity    OSA (obstructive sleep apnea) 03/09/2013   denies; does not use CPAP    Pre-diabetes    last A1C in 2015 5.9% see 01-13-14 lab epic    Sleep apnea    Vitamin D deficiency    Wound, breast    superficial appearing , reddened area to left upper breast; per patient,  has been in sun recently , denies pain or drainage , "just itchy"       Dispostion: Patient discharged in stable condition.  Return precaution discussed.  Patient voices understanding and is in agreement with plan.   Final Clinical Impression(s) / ED Diagnoses Final diagnoses:  Abdominal pain, unspecified abdominal location  Sinus bradycardia    Rx / DC Orders ED Discharge Orders          Ordered     dicyclomine (BENTYL) 20 MG tablet  2 times daily        03/26/23 1427    ondansetron (ZOFRAN-ODT) 4 MG disintegrating tablet  Every 8 hours PRN        03/26/23 1427              Marita [place], PA-C 03/26/23 1443    Loetta Rough, MD 03/30/23 [phone]

## 2023-03-26 NOTE — ED Triage Notes (Signed)
Pt presents with c/o left lower abdominal pain for approx 2 weeks that has gotten worse over the last few days. Pt reports nausea as well as a fever at home.

## 2023-03-26 NOTE — Discharge Instructions (Signed)
Follow-up with the gastroenterologist listed above.  Follow-up with your primary care provider.  If you do not have an active bleeding clinic to establish care with.  For any concerning symptoms return to the emergency room.

## 2023-03-28 LAB — URINE CULTURE: Culture: >=100000 — AB

## 2023-03-29 ENCOUNTER — Telehealth (HOSPITAL_BASED_OUTPATIENT_CLINIC_OR_DEPARTMENT_OTHER): Payer: Self-pay

## 2023-03-29 NOTE — Progress Notes (Signed)
ED Antimicrobial Stewardship Positive Culture Follow Up   [name] is an 62 y.o. female who presented to [facility] on 03/26/2023 with a chief complaint of  Chief Complaint  Patient presents with   Abdominal Pain    Recent Results (from the past 720 hour(s))  Urine Culture     Status: Abnormal   Collection Time: 03/26/23  2:42 PM   Specimen: Urine, Clean Catch  Result Value Ref Range Status   Specimen Description   Final    URINE, CLEAN CATCH Performed at [facility], [address] [place], [place] [address]    Special Requests   Final    NONE Performed at [facility], [address] [place], [place] [address]    Culture >=100,000 COLONIES/mL ESCHERICHIA COLI (A)  Final   Report Status 03/28/2023 FINAL  Final   Organism ID, Bacteria ESCHERICHIA COLI (A)  Final      Susceptibility   Escherichia coli - MIC*    AMPICILLIN 8 SENSITIVE Sensitive     CEFAZOLIN <=4 SENSITIVE Sensitive     CEFEPIME <=0.12 SENSITIVE Sensitive     CEFTRIAXONE <=0.25 SENSITIVE Sensitive     CIPROFLOXACIN >=4 RESISTANT Resistant     GENTAMICIN <=1 SENSITIVE Sensitive     IMIPENEM <=0.25 SENSITIVE Sensitive     NITROFURANTOIN <=16 SENSITIVE Sensitive     TRIMETH/SULFA <=20 SENSITIVE Sensitive     AMPICILLIN/SULBACTAM 4 SENSITIVE Sensitive     PIP/TAZO <=4 SENSITIVE Sensitive ug/mL    * >=100,000 COLONIES/mL ESCHERICHIA COLI    [x]  Patient discharged originally without antimicrobial agent and treatment is now indicated Plan: symptom check, IF still having abdominal pain, then start cephalexin New antibiotic prescription: cephalexin 500 mg po bid x 5 days if still having abdominal pain  ED Provider: Jasmine Pang, PA-C  Herby Abraham, Pharm.D Use secure chat for questions 03/29/2023 10:16 AM Clinical Pharmacist [phone]

## 2023-03-29 NOTE — Telephone Encounter (Signed)
Post ED Visit - Positive Culture Follow-up: Unsuccessful Patient Follow-up  Culture assessed and recommendations reviewed by:  [x]  Len Childs, Pharm.D. []  Celedonio Miyamoto, Pharm.D., BCPS AQ-ID []  Garvin Fila, Pharm.D., BCPS []  Georgina Pillion, Pharm.D., BCPS []  [place], [address].D., BCPS, AAHIVP []  Estella Husk, Pharm.D., BCPS, AAHIVP []  Sherlynn Carbon, PharmD []  Pollyann Samples, PharmD, BCPS  Positive urine culture  [x]  Patient discharged without antimicrobial prescription and treatment is now indicated []  Organism is resistant to prescribed ED discharge antimicrobial []  Patient with positive blood cultures  Plan: Call pt for s/s check, if still having abdominal pain start Keflex 500 mg po BID x 5 days per ED provider Jasmine Pang, PA-C  Unable to contact patient after 3 attempts, letter will be sent to address on file  Sandria Senter 03/29/2023, 4:04 PM

## 2023-03-31 ENCOUNTER — Encounter: Payer: Self-pay | Admitting: Internal Medicine

## 2023-04-14 ENCOUNTER — Telehealth (HOSPITAL_BASED_OUTPATIENT_CLINIC_OR_DEPARTMENT_OTHER): Payer: Self-pay | Admitting: *Deleted

## 2023-04-14 NOTE — Telephone Encounter (Signed)
Call back received in response to letter sent to address on file.  States she continues to have UTI symptoms.  Keflex 500mg  PO BID x 5 days called to [facility] [phone].  Asher Muir Barrett, PA-C)

## 2023-04-29 ENCOUNTER — Other Ambulatory Visit (HOSPITAL_BASED_OUTPATIENT_CLINIC_OR_DEPARTMENT_OTHER): Payer: Self-pay

## 2023-06-23 ENCOUNTER — Ambulatory Visit: Payer: Self-pay | Admitting: Emergency Medicine

## 2023-06-23 ENCOUNTER — Encounter: Payer: Self-pay | Admitting: Emergency Medicine

## 2023-08-07 DIAGNOSIS — Z23 Encounter for immunization: Secondary | ICD-10-CM | POA: Diagnosis not present

## 2023-08-07 DIAGNOSIS — Z0184 Encounter for antibody response examination: Secondary | ICD-10-CM | POA: Diagnosis not present

## 2023-08-29 DIAGNOSIS — K8000 Calculus of gallbladder with acute cholecystitis without obstruction: Secondary | ICD-10-CM | POA: Diagnosis not present

## 2023-08-29 DIAGNOSIS — K219 Gastro-esophageal reflux disease without esophagitis: Secondary | ICD-10-CM | POA: Diagnosis not present

## 2023-08-29 DIAGNOSIS — K8012 Calculus of gallbladder with acute and chronic cholecystitis without obstruction: Secondary | ICD-10-CM | POA: Diagnosis not present

## 2023-08-29 DIAGNOSIS — Z6832 Body mass index (BMI) 32.0-32.9, adult: Secondary | ICD-10-CM | POA: Diagnosis not present

## 2023-08-29 DIAGNOSIS — E669 Obesity, unspecified: Secondary | ICD-10-CM | POA: Diagnosis not present

## 2023-08-29 DIAGNOSIS — F17210 Nicotine dependence, cigarettes, uncomplicated: Secondary | ICD-10-CM | POA: Diagnosis not present

## 2023-08-30 DIAGNOSIS — K8000 Calculus of gallbladder with acute cholecystitis without obstruction: Secondary | ICD-10-CM | POA: Diagnosis not present

## 2023-08-30 DIAGNOSIS — K219 Gastro-esophageal reflux disease without esophagitis: Secondary | ICD-10-CM | POA: Diagnosis not present

## 2023-08-30 DIAGNOSIS — Z6832 Body mass index (BMI) 32.0-32.9, adult: Secondary | ICD-10-CM | POA: Diagnosis not present

## 2023-08-30 DIAGNOSIS — E669 Obesity, unspecified: Secondary | ICD-10-CM | POA: Diagnosis not present

## 2023-08-30 DIAGNOSIS — F17210 Nicotine dependence, cigarettes, uncomplicated: Secondary | ICD-10-CM | POA: Diagnosis not present

## 2023-08-31 DIAGNOSIS — K8012 Calculus of gallbladder with acute and chronic cholecystitis without obstruction: Secondary | ICD-10-CM | POA: Diagnosis not present

## 2023-08-31 HISTORY — PX: CHOLECYSTECTOMY: SHX55

## 2023-11-18 DIAGNOSIS — E669 Obesity, unspecified: Secondary | ICD-10-CM | POA: Diagnosis not present

## 2023-11-18 DIAGNOSIS — I10 Essential (primary) hypertension: Secondary | ICD-10-CM | POA: Diagnosis not present

## 2023-11-18 DIAGNOSIS — Z79899 Other long term (current) drug therapy: Secondary | ICD-10-CM | POA: Diagnosis not present

## 2023-11-18 DIAGNOSIS — K2100 Gastro-esophageal reflux disease with esophagitis, without bleeding: Secondary | ICD-10-CM | POA: Diagnosis not present

## 2023-11-18 DIAGNOSIS — F3341 Major depressive disorder, recurrent, in partial remission: Secondary | ICD-10-CM | POA: Diagnosis not present

## 2024-02-05 DIAGNOSIS — Z1211 Encounter for screening for malignant neoplasm of colon: Secondary | ICD-10-CM | POA: Diagnosis not present

## 2024-02-05 DIAGNOSIS — K2100 Gastro-esophageal reflux disease with esophagitis, without bleeding: Secondary | ICD-10-CM | POA: Diagnosis not present

## 2024-02-05 DIAGNOSIS — Z803 Family history of malignant neoplasm of breast: Secondary | ICD-10-CM | POA: Diagnosis not present

## 2024-02-05 DIAGNOSIS — I10 Essential (primary) hypertension: Secondary | ICD-10-CM | POA: Diagnosis not present

## 2024-03-14 ENCOUNTER — Encounter: Payer: Self-pay | Admitting: Gastroenterology

## 2024-04-29 ENCOUNTER — Encounter: Payer: Self-pay | Admitting: Gastroenterology

## 2024-04-29 ENCOUNTER — Ambulatory Visit: Payer: Self-pay | Admitting: Gastroenterology

## 2024-04-29 VITALS — BP 130/76 | HR 58 | Ht 63.0 in | Wt 187.0 lb

## 2024-04-29 DIAGNOSIS — K5900 Constipation, unspecified: Secondary | ICD-10-CM | POA: Diagnosis not present

## 2024-04-29 DIAGNOSIS — Z1211 Encounter for screening for malignant neoplasm of colon: Secondary | ICD-10-CM | POA: Insufficient documentation

## 2024-04-29 DIAGNOSIS — R197 Diarrhea, unspecified: Secondary | ICD-10-CM

## 2024-04-29 DIAGNOSIS — K219 Gastro-esophageal reflux disease without esophagitis: Secondary | ICD-10-CM | POA: Diagnosis not present

## 2024-04-29 MED ORDER — PANTOPRAZOLE SODIUM 40 MG PO TBEC: 40.0000 mg | DELAYED_RELEASE_TABLET | Freq: Two times a day (BID) | ORAL | 3 refills | Status: AC

## 2024-04-29 MED ORDER — SUTAB 1479-225-188 MG PO TABS: 24.0000 | ORAL_TABLET | Freq: Once | ORAL | 0 refills | Status: AC

## 2024-04-29 NOTE — Patient Instructions (Signed)
 We have sent the following medications to your pharmacy for you to pick up at your convenience: Pantoprazole  40 mg twice daily 30 minutes before breakfast and dinner.   You have been scheduled for an endoscopy and colonoscopy. Please follow the written instructions given to you at your visit today.  If you use inhalers (even only as needed), please bring them with you on the day of your procedure.  DO NOT TAKE 7 DAYS PRIOR TO TEST- Trulicity (dulaglutide) Ozempic , Wegovy  (semaglutide ) Mounjaro , Zepbound  (tirzepatide ) Bydureon Bcise (exanatide extended release)  DO NOT TAKE 1 DAY PRIOR TO YOUR TEST Rybelsus  (semaglutide ) Adlyxin (lixisenatide) Victoza (liraglutide) Byetta (exanatide) ___________________________________________________________________________

## 2024-04-29 NOTE — Progress Notes (Signed)
 04/29/2024 [name] [id number] 1961/05/12  Discussed the use of AI scribe software for clinical note transcription with the patient, who gave verbal consent to proceed.  History of Present Illness [name] is a 63 year old female who presents with worsening acid reflux.  She is a patient of Dr. Darilyn.  She has experienced acid reflux for four to five years, initially managed with Nexium OTC once at night, which was effective temporarily. As symptoms worsened, she increased the dose over time, eventually to Nexium 40 mg twice daily, but without relief. Symptoms include nocturnal belching and sour regurgitation, with even water  triggering symptoms. She avoids late-night eating and has eliminated spicy and fried foods, yet still experiences symptoms with various foods, including ice cream.  She underwent gastric sleeve surgery in 2016, which she suspects may have contributed to her reflux symptoms. She experiences alternating bowel habits, with diarrhea following meals and periods of constipation. She reports black, tarry stools more frequently than normal stools. Her gallbladder was removed in April due to gallstones, which she believes may contribute to urgent stools post-eating.  She had a normal colonoscopy in 2014 but has not had one since due to dislike of the preparation process. No red blood in stool. She reports food sometimes getting stuck when eating, but not with liquids.  No use of ibuprofen , Motrin , Aleve , BCs, or Goody Powders for pain.   Past Medical History:  Diagnosis Date   Anxiety    Depression    Fatty liver    GERD (gastroesophageal reflux disease)    History of gallstones    History of kidney stones    Hyperlipidemia    Hyperparathyroidism    Hypertension    Nephrolithiasis    Obesity    OSA (obstructive sleep apnea) 03/09/2013   denies; does not use CPAP    Pre-diabetes    last A1C in 2015 5.9% see 01-13-14 lab epic    Sleep apnea     Vitamin D deficiency    Wound, breast    superficial appearing , reddened area to left upper breast; per patient, has been in sun recently , denies pain or drainage , just itchy    Past Surgical History:  Procedure Laterality Date   BACK SURGERY     BREAST LUMPECTOMY WITH RADIOACTIVE SEED LOCALIZATION Right 02/05/2018   Procedure: BREAST LUMPECTOMY WITH RADIOACTIVE SEED LOCALIZATION;  Surgeon: Ethyl Lenis, MD;  Location: [facility];  Service: General;  Laterality: Right;   BREATH TEK H PYLORI N/A 08/31/2014   Procedure: SHERIDA SHIRLEAN VEAR BRUCE;  Surgeon: Lenis Ethyl, MD;  Location: [facility];  Service: General;  Laterality: N/A;   CHOLECYSTECTOMY  08/2023   [place]   CYSTOSCOPY WITH STENT PLACEMENT Right 03/21/2020   Procedure: CYSTOSCOPY WITH STENT PLACEMENT;  Surgeon: Renda Glance, MD;  Location: [facility];  Service: Urology;  Laterality: Right;   CYSTOSCOPY/URETEROSCOPY/HOLMIUM LASER/STENT PLACEMENT Right 04/09/2020   Procedure: CYSTOSCOPY/URETEROSCOPYWITH STONE REMOVAL;  Surgeon: Renda Glance, MD;  Location: [facility];  Service: Urology;  Laterality: Right;   LAPAROSCOPIC GASTRIC SLEEVE RESECTION N/A 02/20/2015   Procedure: LAPAROSCOPIC GASTRIC SLEEVE RESECTION;  Surgeon: Lenis Ethyl, MD;  Location: [facility];  Service: General;  Laterality: N/A;   PARTIAL HYSTERECTOMY     sleep study  04/08/2013   THYROID  SURGERY     TOTAL HIP ARTHROPLASTY Right 02/27/2017   Procedure: RIGHT TOTAL HIP ARTHROPLASTY ANTERIOR APPROACH;  Surgeon: Yvone Rush, MD;  Location: [facility];  Service:  Orthopedics;  Laterality: Right;  Panel Lenght: 120 mins    reports that she has quit smoking. Her smoking use included cigarettes. She has a 5 pack-year smoking history. She has never used smokeless tobacco. She reports that she does not drink alcohol and does not use drugs. family history includes COPD in her mother; Colon polyps in her mother; Heart attack in her father and mother; Heart disease in her father  and mother; Hypertension in her brother, mother, sister, and sister; Other in her son. Allergies[1]    Outpatient Encounter Medications as of 04/29/2024  Medication Sig   amLODipine  (NORVASC ) 5 MG tablet Take 5 mg by mouth daily.   diclofenac  Sodium (VOLTAREN ) 1 % GEL Apply 4 g topically 4 (four) times daily.   dicyclomine  (BENTYL ) 20 MG tablet Take 1 tablet (20 mg total) by mouth 2 (two) times daily.   esomeprazole (NEXIUM) 20 MG capsule Take 20 mg by mouth at bedtime.   gabapentin  (NEURONTIN ) 300 MG capsule TAKE 1 CAPSULE BY MOUTH 3 TIMES DAILY.   isosorbide  mononitrate (IMDUR ) 30 MG 24 hr tablet Take 0.5 tablets (15 mg total) by mouth daily.   meloxicam  (MOBIC ) 15 MG tablet Take 1 tablet (15 mg total) by mouth daily.   methocarbamol  (ROBAXIN ) 500 MG tablet Take 500 mg by mouth every 8 (eight) hours as needed for muscle spasms.   methylPREDNISolone  (MEDROL  DOSEPAK) 4 MG TBPK tablet 6 day dose pack - take as directed   mupirocin  ointment (BACTROBAN ) 2 % Apply 1 application topically daily.   ondansetron  (ZOFRAN ) 4 MG tablet Take 1 tablet (4 mg total) by mouth every 6 (six) hours as needed 1 hour prior to meal.   ondansetron  (ZOFRAN -ODT) 4 MG disintegrating tablet Take 1 tablet (4 mg total) by mouth every 8 (eight) hours as needed.   promethazine  (PHENERGAN ) 25 MG tablet Take 1 tablet (25 mg total) by mouth every 6 (six) hours as needed for nausea   Semaglutide -Weight Management 0.25 MG/0.5ML SOAJ Inject 0.25 mg into the skin once a week.   Semaglutide -Weight Management 0.5 MG/0.5ML SOAJ Inject 0.5 mg into the skin once a week.   venlafaxine  XR (EFFEXOR -XR) 75 MG 24 hr capsule Take 75 mg by mouth in the morning.   zolpidem  (AMBIEN ) 5 MG tablet Take 5 mg by mouth at bedtime as needed for sleep.   [DISCONTINUED] busPIRone  (BUSPAR ) 7.5 MG tablet Take 7.5 mg by mouth in the morning.   [DISCONTINUED] cephALEXin  (KEFLEX ) 500 MG capsule Take 1 capsule (500 mg total) by mouth 3 (three) times daily.    [DISCONTINUED] Cholecalciferol (VITAMIN D-3) 5000 UNITS TABS Take 5,000 Units by mouth every morning.    [DISCONTINUED] tirzepatide  (ZEPBOUND ) 10 MG/0.5ML Pen Inject 10 mg into the skin once a week.   [DISCONTINUED] tirzepatide  (ZEPBOUND ) 5 MG/0.5ML Pen Inject 5 mg into the skin once a week.   [DISCONTINUED] tirzepatide  (ZEPBOUND ) 7.5 MG/0.5ML Pen Inject 7.5 mg into the skin once a week.   No facility-administered encounter medications on file as of 04/29/2024.     REVIEW OF SYSTEMS  : All other systems reviewed and negative except where noted in the History of Present Illness.   PHYSICAL EXAM: BP 130/76   Pulse (!) 58   Ht 5' 3 (1.6 m)   Wt 187 lb (84.8 kg)   BMI 33.13 kg/m  General: Well developed white female in no acute distress Head: Normocephalic and atraumatic Eyes:  Sclerae anicteric, conjunctiva pink. Ears: Normal auditory acuity Lungs: Clear throughout to  auscultation; no W/R/R. Heart: Regular rate and rhythm; no M/R/G. Abdomen: Soft, non-distended.  BS present.  Non-tender. Rectal:  Will be done at the time of colonoscopy. Musculoskeletal: Symmetrical with no gross deformities  Skin: No lesions on visible extremities Extremities: No edema  Neurological: Alert oriented x 4, grossly non-focal Psychological:  Alert and cooperative. Normal mood and affect  Assessment & Plan Gastroesophageal reflux disease Chronic GERD for 4-5 years, previously managed with Nexium, now unresponsive to 40 mg twice daily. Symptoms include nocturnal belching and sour regurgitation, exacerbated by even water  intake. Possible contributing factors include gastric sleeve surgery and previous use of GLP-1 agonists. - Ordered endoscopy to assess.  Scheduled with Dr. Avram. - Prescribed Protonix  40 mg twice daily, try a different PPI. - Advised dietary modifications to avoid spicy and fried foods. - Instructed to discontinue Nexium and use Tums or Maalox as needed for breakthrough symptoms  until Protonix  starts working.  CRC screening:  Last colonoscopy 2014.  Due for colonoscopy.  Will schedule with Dr. Avram.  Patient preference for sutab prep.  Alternating diarrhea and constipation, urgency after meals, and occasional black tarry stools.  Recent gallbladder removal in April may contribute to bowel habit changes. - Scheduled colonoscopy to evaluate bowel habits and rule out lower GI pathology. - Advised on potential for urgency after meals due to postcholecystectomy changes.  **The risks, benefits, and alternatives to EGD and colonoscopy were discussed with the patient and she consents to proceed.  CC:  No ref. provider found       [1]  Allergies Allergen Reactions   Cortisone Swelling and Rash   Aspirin  Other (See Comments)    Upsets my stomach   Lisinopril Cough   Hydrocodone  Itching   Sulfa  Antibiotics Rash

## 2024-05-04 MED ORDER — NA SULFATE-K SULFATE-MG SULF 17.5-3.13-1.6 GM/177ML PO SOLN: 1.0000 | Freq: Once | ORAL | 0 refills | Status: AC

## 2024-05-04 NOTE — Addendum Note (Signed)
 Addended by: WILL POWELL CROME on: 05/04/2024 03:45 PM   Modules accepted: Orders

## 2024-05-16 ENCOUNTER — Encounter: Payer: Self-pay | Admitting: Internal Medicine

## 2024-05-23 ENCOUNTER — Ambulatory Visit (AMBULATORY_SURGERY_CENTER): Payer: Self-pay | Admitting: Internal Medicine

## 2024-05-23 ENCOUNTER — Encounter: Payer: Self-pay | Admitting: Internal Medicine

## 2024-05-23 VITALS — BP 118/71 | HR 50 | Temp 97.5°F | Resp 14 | Ht 63.0 in | Wt 187.0 lb

## 2024-05-23 DIAGNOSIS — Z1211 Encounter for screening for malignant neoplasm of colon: Secondary | ICD-10-CM

## 2024-05-23 DIAGNOSIS — Z9884 Bariatric surgery status: Secondary | ICD-10-CM

## 2024-05-23 DIAGNOSIS — K3189 Other diseases of stomach and duodenum: Secondary | ICD-10-CM

## 2024-05-23 DIAGNOSIS — K6289 Other specified diseases of anus and rectum: Secondary | ICD-10-CM | POA: Diagnosis not present

## 2024-05-23 DIAGNOSIS — K219 Gastro-esophageal reflux disease without esophagitis: Secondary | ICD-10-CM

## 2024-05-23 DIAGNOSIS — K2100 Gastro-esophageal reflux disease with esophagitis, without bleeding: Secondary | ICD-10-CM | POA: Diagnosis not present

## 2024-05-23 DIAGNOSIS — K209 Esophagitis, unspecified without bleeding: Secondary | ICD-10-CM

## 2024-05-23 MED ORDER — SODIUM CHLORIDE 0.9 % IV SOLN: 500.0000 mL | Freq: Once | INTRAVENOUS | Status: DC

## 2024-05-23 MED ORDER — METOCLOPRAMIDE HCL 10 MG PO TABS: 10.0000 mg | ORAL_TABLET | Freq: Every day | ORAL | 2 refills | Status: AC

## 2024-05-23 NOTE — Progress Notes (Signed)
 Called to room to assist during endoscopic procedure.  Patient ID and intended procedure confirmed with present staff. Received instructions for my participation in the procedure from the performing physician.

## 2024-05-23 NOTE — Progress Notes (Signed)
Updated medical record with pt

## 2024-05-23 NOTE — Op Note (Addendum)
 [facility] Patient Name: [name] Procedure Date: 05/23/2024 1:53 PM MRN: [id number] Endoscopist: Lupita FORBES Commander , MD, [id number] Age: 64 Referring MD:  Date of Birth: 11/09/1960 Gender: Female Account #: [id number] Procedure:                Upper GI endoscopy Indications:              Esophageal reflux symptoms that persist despite                            appropriate therapy Medicines:                Monitored Anesthesia Care Procedure:                Pre-Anesthesia Assessment:                           - Prior to the procedure, a History and Physical                            was performed, and patient medications and                            allergies were reviewed. The patient's tolerance of                            previous anesthesia was also reviewed. The risks                            and benefits of the procedure and the sedation                            options and risks were discussed with the patient.                            All questions were answered, and informed consent                            was obtained. Prior Anticoagulants: The patient has                            taken no anticoagulant or antiplatelet agents. ASA                            Grade Assessment: II - A patient with mild systemic                            disease. After reviewing the risks and benefits,                            the patient was deemed in satisfactory condition to                            undergo the procedure.  After obtaining informed consent, the endoscope was                            passed under direct vision. Throughout the                            procedure, the patient's blood pressure, pulse, and                            oxygen saturations were monitored continuously. The                            Olympus Scope [id number] was introduced through the                            mouth, and advanced to the second  part of duodenum.                            The upper GI endoscopy was accomplished without                            difficulty. The patient tolerated the procedure                            well. Scope In: Scope Out: Findings:                 LA Grade A (one or more mucosal breaks less than 5                            mm, not extending between tops of 2 mucosal folds)                            esophagitis with no bleeding was found in the                            distal esophagus. Biopsies were taken with a cold                            forceps for histology. Verification of patient                            identification for the specimen was done. Estimated                            blood loss was minimal.                           The gastroesophageal flap valve was visualized                            endoscopically and classified as Hill Grade II                            (  fold present, opens with respiration).                           Evidence of a sleeve gastrectomy was found in the                            cardia, in the gastric fundus, in the gastric body                            and in the gastric antrum. This was characterized                            by erythema. Biopsies were taken with a cold                            forceps for histology. Verification of patient                            identification for the specimen was done. Estimated                            blood loss was minimal.                           The cardia and gastric fundus were otherwise normal                            s/p sleeve gastrectomy on retroflexion.                           The exam was otherwise without abnormality. Complications:            No immediate complications. Estimated Blood Loss:     Estimated blood loss was minimal. Impression:               - LA Grade A reflux esophagitis with no bleeding.                            Biopsied.                            - Gastroesophageal flap valve classified as Hill                            Grade II (fold present, opens with respiration).                           - A sleeve gastrectomy was found, characterized by                            erythema. Biopsied.                           - The examination was otherwise normal. Recommendation:           - Patient has a contact  number available for                            emergencies. The signs and symptoms of potential                            delayed complications were discussed with the                            patient. Return to normal activities tomorrow.                            Written discharge instructions were provided to the                            patient.                           - GERD prevention diet.                           - Continue present medications.                           - Follow an antireflux regimen.                           - Await pathology results.                           - Likely need to consider changing to Roux-en-Y                            bypass.                           Consiser trying Voquenza, if affordable. - We                            discussed in recovery - will add qhs Reglan  - she                            says she gives adequate time before last meal and                            bedtime but having nocturnal sxs despite bid                            pantoprazole . She is open to idea of surgical                            revision.                           - See the other procedure note for documentation of  additional recommendations. Lupita FORBES Commander, MD 05/23/2024 2:38:08 PM This report has been signed electronically.

## 2024-05-23 NOTE — Progress Notes (Signed)
 History and Physical Interval Note:  05/23/2024 2:07 PM  [name]  has presented today for endoscopic procedure(s), with the diagnosis of  Encounter Diagnoses  Name Primary?   Screening for colon cancer Yes   Gastroesophageal reflux disease, unspecified whether esophagitis present   .  The various methods of evaluation and treatment have been discussed with the patient and/or family. After consideration of risks, benefits and other options for treatment, the patient has consented to  the endoscopic procedure(s).   The patient's history has been reviewed, patient examined, no change in status, stable for endoscopic procedure(s).  I have reviewed the patient's chart and labs.  Questions were answered to the patient's satisfaction.     [name], MD, NOLIA

## 2024-05-23 NOTE — Progress Notes (Signed)
1406 Robinul 0.1 mg IV given due large amount of secretions upon assessment.  MD made aware, vss  

## 2024-05-23 NOTE — Op Note (Signed)
 [facility] Patient Name: [name] Procedure Date: 05/23/2024 1:42 PM MRN: [id number] Endoscopist: Lupita FORBES Commander , MD, [id number] Age: 64 Referring MD:  Date of Birth: [birth year] Gender: Female Account #: [id number] Procedure:                Colonoscopy Indications:              Screening for colorectal malignant neoplasm Medicines:                Monitored Anesthesia Care Procedure:                Pre-Anesthesia Assessment:                           - Prior to the procedure, a History and Physical                            was performed, and patient medications and                            allergies were reviewed. The patient's tolerance of                            previous anesthesia was also reviewed. The risks                            and benefits of the procedure and the sedation                            options and risks were discussed with the patient.                            All questions were answered, and informed consent                            was obtained. Prior Anticoagulants: The patient has                            taken no anticoagulant or antiplatelet agents. ASA                            Grade Assessment: II - A patient with mild systemic                            disease. After reviewing the risks and benefits,                            the patient was deemed in satisfactory condition to                            undergo the procedure.                           After obtaining informed consent, the colonoscope  was passed under direct vision. Throughout the                            procedure, the patient's blood pressure, pulse, and                            oxygen saturations were monitored continuously. The                            [id number] Colonoscope [id number] was introduced                            through the anus and advanced to the the cecum,                            identified by  appendiceal orifice and ileocecal                            valve. The colonoscopy was performed without                            difficulty. The patient tolerated the procedure                            well. The quality of the bowel preparation was                            good. The ileocecal valve, appendiceal orifice, and                            rectum were photographed. The bowel preparation                            used was SUTAB  via split dose instruction. Scope In: 2:20:25 PM Scope Out: 2:28:34 PM Scope Withdrawal Time: 0 hours 5 minutes 34 seconds  Total Procedure Duration: 0 hours 8 minutes 9 seconds  Findings:                 The perianal and digital rectal examinations were                            normal.                           Anal papilla(e) were hypertrophied.                           The exam was otherwise without abnormality on                            direct and retroflexion views. Complications:            No immediate complications. Estimated Blood Loss:     Estimated blood loss: none. Impression:               - Anal papilla(e) were hypertrophied.                           -  The examination was otherwise normal on direct                            and retroflexion views.                           - No specimens collected. Recommendation:           - Patient has a contact number available for                            emergencies. The signs and symptoms of potential                            delayed complications were discussed with the                            patient. Return to normal activities tomorrow.                            Written discharge instructions were provided to the                            patient.                           - See the other procedure note for documentation of                            additional recommendations.                           - Repeat colonoscopy in 10 years for screening                             purposes. Lupita FORBES Commander, MD 05/23/2024 3:10:12 PM This report has been signed electronically.

## 2024-05-23 NOTE — Progress Notes (Signed)
 Report given to PACU, vss

## 2024-05-23 NOTE — Patient Instructions (Addendum)
 The upper endoscopy exam showed some changes of reflux in the esophagus, and possible stomach inflammation.  I took biopsies.  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  We can refer you to a bariatric surgeon to discuss that, if desired.  Will try Reglan  at bedtime - be sure to elevate head of bed also.  Colonoscopy was normal.  Next routine repeat examination in 10 years.  I appreciate the opportunity to care for you. Jennifer CHARLENA Commander, MD, [facility]  GERD prevention diet.  Follow an antireflux regimen.  Awaiting pathology results.  Handouts provided on esophagitis, gastritis, and GERD.   YOU HAD AN ENDOSCOPIC PROCEDURE TODAY AT THE [facility]:   Refer to the procedure report that was given to you for any specific questions about what was found during the examination.  If the procedure report does not answer your questions, please call your gastroenterologist to clarify.  If you requested that your care partner not be given the details of your procedure findings, then the procedure report has been included in a sealed envelope for you to review at your convenience later.  YOU SHOULD EXPECT: Some feelings of bloating in the abdomen. Passage of more gas than usual.  Walking can help get rid of the air that was put into your GI tract during the procedure and reduce the bloating. If you had a lower endoscopy (such as a colonoscopy or flexible sigmoidoscopy) you may notice spotting of blood in your stool or on the toilet paper. If you underwent a bowel prep for your procedure, you may not have a normal bowel movement for a few days.  Please Note:  You might notice some irritation and congestion in your nose or some drainage.  This is from the oxygen used during your procedure.  There is no need for concern and it should clear up in a day or so.  SYMPTOMS TO REPORT IMMEDIATELY:  Following  lower endoscopy (colonoscopy or flexible sigmoidoscopy):  Excessive amounts of blood in the stool  Significant tenderness or worsening of abdominal pains  Swelling of the abdomen that is new, acute  Fever of 100F or higher  Following upper endoscopy (EGD)  Vomiting of blood or coffee ground material  New chest pain or pain under the shoulder blades  Painful or persistently difficult swallowing  New shortness of breath  Fever of 100F or higher  Black, tarry-looking stools  For urgent or emergent issues, a gastroenterologist can be reached at any hour by calling [phone]. Do not use MyChart messaging for urgent concerns.    DIET:  We do recommend a small meal at first, but then you may proceed to your regular diet.  Drink plenty of fluids but you should avoid alcoholic beverages for 24 hours.  ACTIVITY:  You should plan to take it easy for the rest of today and you should NOT DRIVE or use heavy machinery until tomorrow (because of the sedation medicines used during the test).    FOLLOW UP: Our staff will call the number listed on your records the next business day following your procedure.  We will call around 7:15- 8:00 am to check on you and address any questions or concerns that you may have regarding the information given to you following your procedure. If we do not reach you, we will leave a message.     If any biopsies were  taken you will be contacted by phone or by letter within the next 1-3 weeks.  Please call us  at [phone] if you have not heard about the biopsies in 3 weeks.    SIGNATURES/CONFIDENTIALITY: You and/or your care partner have signed paperwork which will be entered into your electronic medical record.  These signatures attest to the fact that that the information above on your After Visit Summary has been reviewed and is understood.  Full responsibility of the confidentiality of this discharge information lies with you and/or your care-partner.

## 2024-05-24 ENCOUNTER — Telehealth: Payer: Self-pay

## 2024-05-24 NOTE — Telephone Encounter (Signed)
 Follow up call to pt, no answer.

## 2024-05-26 LAB — SURGICAL PATHOLOGY

## 2024-05-31 ENCOUNTER — Ambulatory Visit: Payer: Self-pay | Admitting: Internal Medicine

## 2024-06-23 ENCOUNTER — Other Ambulatory Visit (HOSPITAL_COMMUNITY): Payer: Self-pay | Admitting: Surgery

## 2024-06-23 DIAGNOSIS — K2100 Gastro-esophageal reflux disease with esophagitis, without bleeding: Secondary | ICD-10-CM

## 2024-07-12 ENCOUNTER — Encounter: Admitting: Dietician
